# Patient Record
Sex: Female | Born: 1960 | Race: White | Hispanic: No | Marital: Married | Smoking: Never smoker
Health system: Southern US, Community
[De-identification: ages and names within clinical notes are randomized; demographics above are authoritative.]

## PROBLEM LIST (undated history)

## (undated) DIAGNOSIS — E119 Type 2 diabetes mellitus without complications: Secondary | ICD-10-CM

## (undated) DIAGNOSIS — Z8489 Family history of other specified conditions: Secondary | ICD-10-CM

## (undated) DIAGNOSIS — S060X9A Concussion with loss of consciousness of unspecified duration, initial encounter: Secondary | ICD-10-CM

## (undated) DIAGNOSIS — I10 Essential (primary) hypertension: Secondary | ICD-10-CM

## (undated) DIAGNOSIS — K219 Gastro-esophageal reflux disease without esophagitis: Secondary | ICD-10-CM

## (undated) DIAGNOSIS — F32A Depression, unspecified: Secondary | ICD-10-CM

## (undated) DIAGNOSIS — E7800 Pure hypercholesterolemia, unspecified: Secondary | ICD-10-CM

## (undated) DIAGNOSIS — F329 Major depressive disorder, single episode, unspecified: Secondary | ICD-10-CM

## (undated) DIAGNOSIS — B379 Candidiasis, unspecified: Secondary | ICD-10-CM

## (undated) HISTORY — DX: Pure hypercholesterolemia, unspecified: E78.00

## (undated) HISTORY — DX: Type 2 diabetes mellitus without complications: E11.9

## (undated) HISTORY — PX: WRIST SURGERY: SHX841

## (undated) HISTORY — DX: Depression, unspecified: F32.A

## (undated) HISTORY — PX: KNEE SURGERY: SHX244

## (undated) HISTORY — PX: GALLBLADDER SURGERY: SHX652

## (undated) HISTORY — DX: Essential (primary) hypertension: I10

## (undated) HISTORY — DX: Major depressive disorder, single episode, unspecified: F32.9

---

## 2004-07-25 ENCOUNTER — Ambulatory Visit: Payer: Self-pay | Admitting: Orthopaedic Surgery

## 2004-11-19 ENCOUNTER — Ambulatory Visit: Payer: Self-pay | Admitting: Family Medicine

## 2010-01-12 ENCOUNTER — Ambulatory Visit: Payer: Self-pay | Admitting: Family Medicine

## 2010-04-16 ENCOUNTER — Ambulatory Visit: Payer: Self-pay | Admitting: Family Medicine

## 2014-01-26 ENCOUNTER — Encounter: Payer: Self-pay | Admitting: Podiatry

## 2014-01-26 ENCOUNTER — Ambulatory Visit (INDEPENDENT_AMBULATORY_CARE_PROVIDER_SITE_OTHER): Payer: BC Managed Care – PPO

## 2014-01-26 ENCOUNTER — Ambulatory Visit (INDEPENDENT_AMBULATORY_CARE_PROVIDER_SITE_OTHER): Payer: BC Managed Care – PPO | Admitting: Podiatry

## 2014-01-26 ENCOUNTER — Other Ambulatory Visit: Payer: Self-pay | Admitting: *Deleted

## 2014-01-26 VITALS — BP 134/66 | HR 74 | Resp 16 | Ht 67.5 in | Wt 226.0 lb

## 2014-01-26 DIAGNOSIS — M722 Plantar fascial fibromatosis: Secondary | ICD-10-CM

## 2014-01-26 NOTE — Progress Notes (Signed)
   Subjective:    Patient ID: Betty Walters, female    DOB: 11-Dec-1960, 53 y.o.   MRN: [id number]  HPI Comments: Both plantar heels hurts for the last few months , it has progressively got worse. Hurts with any pressure on it .   Diabetic for 8 years , blood sugar on Monday was 136   Foot Pain Associated symptoms include numbness.      Review of Systems  Endocrine:       Diabetes   Musculoskeletal: Positive for back pain.  Neurological: Positive for numbness.  All other systems reviewed and are negative.      Objective:   Physical Exam: I have reviewed her past medical history medications allergies surgery social history and review of systems. Pulses are strongly palpable bilateral. Neurologic sensorium is intact percent lasting monofilament. Deep tendon reflexes are intact bilateral. Muscle strength is 5 over 5 dorsiflexion plantar flexors and inverters and everters all into the musculature is intact. Orthopedic evaluation and shoots all joints distal to the angle full range of motion without crepitation. She has severe pain on palpation medial calcaneal tubercle bilateral. Radiographic evaluation does dentistry soft tissue increasing density at the plantar fascial calcaneal insertion site.        Assessment & Plan:  Assessment: Plantar fasciitis bilateral.  Plan: We discussed the etiology pathology conservative versus surgical therapies. I injected the bilateral heels today with Kenalog and local anesthetic. Placed in a plantar fascial brace and a night splint. She will continue her Lodine no steroids were given. We discussed appropriate shoe gear stretching exercises ice therapy sugar modifications. I will follow up with her in 1 month.

## 2014-02-28 ENCOUNTER — Ambulatory Visit: Payer: BC Managed Care – PPO | Admitting: Podiatry

## 2014-04-06 LAB — LIPID PANEL
Cholesterol: 211 mg/dL — AB (ref 0–200)
HDL: 53 mg/dL (ref 35–70)
LDL CALC: 123 mg/dL
TRIGLYCERIDES: 177 mg/dL — AB (ref 40–160)

## 2014-04-06 LAB — BASIC METABOLIC PANEL
BUN: 15 mg/dL (ref 4–21)
Creatinine: 0.7 mg/dL (ref <=1.1)
GLUCOSE: 156 mg/dL

## 2014-04-06 LAB — HEPATIC FUNCTION PANEL
ALT: 23 U/L (ref 7–35)
AST: 11 U/L — AB (ref 13–35)

## 2014-04-11 LAB — HEMOGLOBIN A1C: Hgb A1c MFr Bld: 7.6 % — AB (ref 4.0–6.0)

## 2014-07-22 ENCOUNTER — Telehealth: Payer: Self-pay | Admitting: *Deleted

## 2014-07-22 NOTE — Telephone Encounter (Signed)
Pt states she is having a very bad flare-up of plantar fasciitis, next appt is 07/18/2014, what can she do until then?

## 2014-07-25 ENCOUNTER — Ambulatory Visit: Payer: BC Managed Care – PPO | Admitting: Podiatry

## 2014-07-31 DIAGNOSIS — S060X9A Concussion with loss of consciousness of unspecified duration, initial encounter: Secondary | ICD-10-CM

## 2014-07-31 DIAGNOSIS — S060XAA Concussion with loss of consciousness status unknown, initial encounter: Secondary | ICD-10-CM

## 2014-07-31 HISTORY — DX: Concussion with loss of consciousness status unknown, initial encounter: S06.0XAA

## 2014-07-31 HISTORY — DX: Concussion with loss of consciousness of unspecified duration, initial encounter: S06.0X9A

## 2014-08-10 ENCOUNTER — Emergency Department: Payer: Worker's Compensation

## 2014-08-10 ENCOUNTER — Emergency Department
Admission: EM | Admit: 2014-08-10 | Discharge: 2014-08-10 | Disposition: A | Discharge status: Home / Self Care | Payer: Worker's Compensation | Attending: Internal Medicine | Admitting: Internal Medicine

## 2014-08-10 ENCOUNTER — Encounter: Payer: Self-pay | Admitting: *Deleted

## 2014-08-10 DIAGNOSIS — Y9289 Other specified places as the place of occurrence of the external cause: Secondary | ICD-10-CM | POA: Diagnosis not present

## 2014-08-10 DIAGNOSIS — Y9389 Activity, other specified: Secondary | ICD-10-CM | POA: Insufficient documentation

## 2014-08-10 DIAGNOSIS — S00511A Abrasion of lip, initial encounter: Secondary | ICD-10-CM | POA: Insufficient documentation

## 2014-08-10 DIAGNOSIS — S0993XA Unspecified injury of face, initial encounter: Secondary | ICD-10-CM | POA: Diagnosis present

## 2014-08-10 DIAGNOSIS — Y998 Other external cause status: Secondary | ICD-10-CM | POA: Insufficient documentation

## 2014-08-10 DIAGNOSIS — Z79899 Other long term (current) drug therapy: Secondary | ICD-10-CM | POA: Insufficient documentation

## 2014-08-10 DIAGNOSIS — S8992XA Unspecified injury of left lower leg, initial encounter: Secondary | ICD-10-CM | POA: Insufficient documentation

## 2014-08-10 DIAGNOSIS — S0031XA Abrasion of nose, initial encounter: Secondary | ICD-10-CM | POA: Insufficient documentation

## 2014-08-10 DIAGNOSIS — S199XXA Unspecified injury of neck, initial encounter: Secondary | ICD-10-CM | POA: Diagnosis not present

## 2014-08-10 DIAGNOSIS — S4991XA Unspecified injury of right shoulder and upper arm, initial encounter: Secondary | ICD-10-CM | POA: Diagnosis not present

## 2014-08-10 DIAGNOSIS — I10 Essential (primary) hypertension: Secondary | ICD-10-CM | POA: Diagnosis not present

## 2014-08-10 DIAGNOSIS — S8991XA Unspecified injury of right lower leg, initial encounter: Secondary | ICD-10-CM | POA: Diagnosis not present

## 2014-08-10 DIAGNOSIS — S4992XA Unspecified injury of left shoulder and upper arm, initial encounter: Secondary | ICD-10-CM | POA: Diagnosis not present

## 2014-08-10 DIAGNOSIS — E119 Type 2 diabetes mellitus without complications: Secondary | ICD-10-CM | POA: Insufficient documentation

## 2014-08-10 DIAGNOSIS — Z791 Long term (current) use of non-steroidal anti-inflammatories (NSAID): Secondary | ICD-10-CM | POA: Diagnosis not present

## 2014-08-10 DIAGNOSIS — W01198A Fall on same level from slipping, tripping and stumbling with subsequent striking against other object, initial encounter: Secondary | ICD-10-CM | POA: Insufficient documentation

## 2014-08-10 DIAGNOSIS — W19XXXA Unspecified fall, initial encounter: Secondary | ICD-10-CM

## 2014-08-10 DIAGNOSIS — Z880 Allergy status to penicillin: Secondary | ICD-10-CM | POA: Insufficient documentation

## 2014-08-10 MED ORDER — HYDROCODONE-ACETAMINOPHEN 5-325 MG PO TABS: 1.0000 | ORAL_TABLET | ORAL | Status: DC | PRN

## 2014-08-10 NOTE — ED Notes (Signed)
   08/10/14 1805  Musculoskeletal  Musculoskeletal (WDL) X  Pt c/o pain in face after fall. Abrasion noted to nose and lip.

## 2014-08-10 NOTE — ED Provider Notes (Signed)
[facility] Emergency Department Provider Note  ____________________________________________  Time seen: Approximately 6:03 PM  I have reviewed the triage vital signs and the nursing notes.   HISTORY  Chief Complaint Fall   HPI Betty Walters is a 54 y.o. female patient reports she hasn't work and tripped on a rug. She fell forward hitting her face. Denies loss of consciousness. Patient states she also feels some soreness and neck arms and knees.Hx of torn left ACL. Pain is localized to he nose and maxillofacial area.   Past Medical History  Diagnosis Date  . Hypertension   . Diabetes mellitus without complication   . High cholesterol   . Depression     There are no active problems to display for this patient.   Past Surgical History  Procedure Laterality Date  . Gallbladder surgery    . Cesarean section      Current Outpatient Rx  Name  Route  Sig  Dispense  Refill  . etodolac (LODINE) 400 MG tablet               . glipiZIDE (GLUCOTROL) 10 MG tablet               . HYDROcodone-acetaminophen (NORCO) 5-325 MG per tablet   Oral   Take 1 tablet by mouth every 4 (four) hours as needed for moderate pain.   12 tablet   0   . lisinopril (PRINIVIL,ZESTRIL) 20 MG tablet               . lovastatin (MEVACOR) 20 MG tablet               . metFORMIN (GLUCOPHAGE) 1000 MG tablet               . sertraline (ZOLOFT) 100 MG tablet                 Allergies Amoxicillin-pot clavulanate  No family history on file.  Social History History  Substance Use Topics  . Smoking status: Never Smoker   . Smokeless tobacco: Never Used  . Alcohol Use: No    Review of Systems Constitutional: No fever/chills Eyes: No visual changes. ENT: No sore throat. Positive nose and left upper lip pain. Right nose bleeding resolved. Musculoskeletal: Negative for back pain. Positive left knee pain. Skin: Negative for rash. Neurological:  Negative for headaches, focal weakness or numbness.  10-point ROS otherwise negative.  ____________________________________________   PHYSICAL EXAM:  VITAL SIGNS: ED Triage Vitals  Enc Vitals Group     BP 08/10/14 1751 128/65 mmHg     Pulse Rate 08/10/14 1751 73     Resp 08/10/14 1751 18     Temp 08/10/14 1751 97.8 F (36.6 C)     Temp Source 08/10/14 1751 Oral     SpO2 08/10/14 1751 98 %     Weight 08/10/14 1751 214 lb (97.07 kg)     Height 08/10/14 1751 5\' 8"  (1.727 m)     Head Cir --      Peak Flow --      Pain Score 08/10/14 1752 6     Pain Loc --      Pain Edu? --      Excl. in [place]? --    Constitutional: Alert and oriented. Well appearing and in no acute distress. Eyes: Conjunctivae are normal. PERRL. EOMI. Head: Atraumatic. Nose: No congestion/rhinnorhea. Positive dried blood right nare. Mouth/Throat: Mucous membranes are moist.  Oropharynx non-erythematous. Positive tenderness to  upper mouth, teeth. No fx noted. Neck: No stridor.  No cervical spine tenderness to palpation. Musculoskeletal: No lower extremity tenderness nor edema.  No joint effusions. Redness noted to left knee with some palpable tenderness. No LROM. Decreased strength all exams. Neurologic:  Normal speech and language. No gross focal neurologic deficits are appreciated. Speech is normal. No gait instability. Skin:  Skin is warm, dry and intact. No rash noted. Psychiatric: Mood and affect are normal. Speech and behavior are normal.  ____________________________________________   LABS (all labs ordered are listed, but only abnormal results are displayed)  Labs Reviewed - No data to display ____________________________________________  EKG  Deferred ____________________________________________  RADIOLOGY  Interpreted by radiologist and reviewed by myself. All negative. ____________________________________________   PROCEDURES  Procedure(s) performed: None  Critical Care performed:  No  ____________________________________________   INITIAL IMPRESSION / ASSESSMENT AND PLAN / ED COURSE  Pertinent labs & imaging results that were available during my care of the patient were reviewed by me and considered in my medical decision making (see chart for details).  There is anything was discussed with the patient. Workmen's Comp. form filled out. Patient understands to return to the ER if new development or symptoms occur or worsen. ____________________________________________   FINAL CLINICAL IMPRESSION(S) / ED DIAGNOSES  Final diagnoses:  Fall, initial encounter      Arlyss Repress, PA-C 08/10/14 [place], DO 08/10/14 2223

## 2014-08-10 NOTE — ED Notes (Signed)
Pt reports she was at work and tripped on a rug. She fell forward, hitting her face. Denies LOC. Pt says that she also feels soreness in her neck, arms and knees.

## 2014-09-28 ENCOUNTER — Other Ambulatory Visit: Payer: Self-pay | Admitting: Family Medicine

## 2014-09-28 ENCOUNTER — Ambulatory Visit (INDEPENDENT_AMBULATORY_CARE_PROVIDER_SITE_OTHER): Payer: BC Managed Care – PPO | Admitting: Family Medicine

## 2014-09-28 ENCOUNTER — Encounter: Payer: Self-pay | Admitting: Family Medicine

## 2014-09-28 VITALS — BP 130/88 | HR 70 | Ht 68.0 in | Wt 235.0 lb

## 2014-09-28 DIAGNOSIS — E669 Obesity, unspecified: Secondary | ICD-10-CM | POA: Insufficient documentation

## 2014-09-28 DIAGNOSIS — E119 Type 2 diabetes mellitus without complications: Secondary | ICD-10-CM | POA: Diagnosis not present

## 2014-09-28 DIAGNOSIS — E66811 Obesity, class 1: Secondary | ICD-10-CM | POA: Insufficient documentation

## 2014-09-28 DIAGNOSIS — F339 Major depressive disorder, recurrent, unspecified: Secondary | ICD-10-CM

## 2014-09-28 DIAGNOSIS — F329 Major depressive disorder, single episode, unspecified: Secondary | ICD-10-CM | POA: Diagnosis not present

## 2014-09-28 DIAGNOSIS — E785 Hyperlipidemia, unspecified: Secondary | ICD-10-CM | POA: Diagnosis not present

## 2014-09-28 DIAGNOSIS — I10 Essential (primary) hypertension: Secondary | ICD-10-CM

## 2014-09-28 DIAGNOSIS — E1142 Type 2 diabetes mellitus with diabetic polyneuropathy: Secondary | ICD-10-CM

## 2014-09-28 DIAGNOSIS — F32A Depression, unspecified: Secondary | ICD-10-CM

## 2014-09-28 MED ORDER — SERTRALINE HCL 100 MG PO TABS: 100.0000 mg | ORAL_TABLET | Freq: Every day | ORAL | Status: DC

## 2014-09-28 MED ORDER — METFORMIN HCL 500 MG PO TABS: 500.0000 mg | ORAL_TABLET | Freq: Two times a day (BID) | ORAL | Status: DC

## 2014-09-28 MED ORDER — LISINOPRIL 20 MG PO TABS: 20.0000 mg | ORAL_TABLET | Freq: Every day | ORAL | Status: DC

## 2014-09-28 MED ORDER — LOVASTATIN 20 MG PO TABS: 20.0000 mg | ORAL_TABLET | Freq: Every morning | ORAL | Status: DC

## 2014-09-28 MED ORDER — GLIPIZIDE ER 10 MG PO TB24: 10.0000 mg | ORAL_TABLET | Freq: Two times a day (BID) | ORAL | Status: DC

## 2014-09-28 NOTE — Progress Notes (Signed)
Name: Betty Walters   MRN: [id number]    DOB: 1960/08/25   Date:09/28/2014       Progress Note  Subjective  Chief Complaint  Chief Complaint  Patient presents with  . Depression  . Diabetes  . Hypertension  . Hyperlipidemia    Diabetes She presents for her follow-up diabetic visit. She has type 2 diabetes mellitus. Her disease course has been fluctuating. Pertinent negatives for hypoglycemia include no confusion, dizziness, headaches, hunger, mood changes, nervousness/anxiousness, pallor, seizures, sleepiness, speech difficulty, sweats or tremors. Associated symptoms include blurred vision. Pertinent negatives for diabetes include no chest pain, no fatigue, no foot paresthesias, no foot ulcerations, no polydipsia, no polyphagia, no polyuria, no visual change, no weakness and no weight loss. (Visual concern since fall) Pertinent negatives for hypoglycemia complications include no blackouts, no hospitalization, no nocturnal hypoglycemia, no required assistance and no required glucagon injection. Symptoms are improving. There are no diabetic complications. Pertinent negatives for diabetic complications include no CVA, PVD or retinopathy. Risk factors for coronary artery disease include diabetes mellitus, dyslipidemia, obesity and hypertension. Current diabetic treatment includes diet and oral agent (dual therapy). Her weight is decreasing steadily. She is following a generally healthy diet. Meal planning includes carbohydrate counting. She has not had a previous visit with a dietitian. Her breakfast blood glucose is taken between 9-10 am. Her breakfast blood glucose range is generally 110-130 mg/dl. An ACE inhibitor/angiotensin II receptor blocker is being taken. She sees a podiatrist.Eye exam is current.  Hypertension This is a chronic problem. The current episode started more than 1 year ago. The problem has been gradually worsening since onset. The problem is controlled. Associated symptoms include  blurred vision. Pertinent negatives include no anxiety, chest pain, headaches, malaise/fatigue, neck pain, orthopnea, palpitations, peripheral edema, PND, shortness of breath or sweats. There are no associated agents to hypertension. Past treatments include ACE inhibitors. The current treatment provides moderate improvement. There are no compliance problems.  There is no history of angina, kidney disease, CAD/MI, CVA, heart failure, left ventricular hypertrophy, PVD or retinopathy. There is no history of chronic renal disease.  Hyperlipidemia This is a recurrent problem. The current episode started more than 1 year ago. The problem is controlled. Recent lipid tests were reviewed and are low. Exacerbating diseases include diabetes and obesity. She has no history of chronic renal disease, hypothyroidism, liver disease or nephrotic syndrome. Pertinent negatives include no chest pain, focal sensory loss, focal weakness, leg pain, myalgias or shortness of breath. The current treatment provides mild improvement of lipids. There are no compliance problems.  Risk factors for coronary artery disease include dyslipidemia, diabetes mellitus and hypertension.  Mental Health Problem The primary symptoms do not include dysphoric mood, delusions, hallucinations, bizarre behavior, disorganized speech, negative symptoms or somatic symptoms.  The onset of the illness is precipitated by emotional stress. The degree of incapacity that she is experiencing as a consequence of her illness is moderate. Additional symptoms of the illness do not include no anhedonia, no insomnia, no hypersomnia, no appetite change, no unexpected weight change, no fatigue, no agitation, no psychomotor retardation, no feelings of worthlessness, no attention impairment, no euphoric mood, no increased goal-directed activity, no flight of ideas, no inflated self-esteem, no decreased need for sleep, not distractible, no poor judgment, no visual change, no  headaches, no abdominal pain or no seizures. She does not admit to suicidal ideas. She does not have a plan to commit suicide. She does not contemplate harming herself. She has not already  injured self.    No problem-specific assessment & plan notes found for this encounter.   Past Medical History  Diagnosis Date  . Hypertension   . Diabetes mellitus without complication   . High cholesterol   . Depression     Past Surgical History  Procedure Laterality Date  . Gallbladder surgery    . Cesarean section    . Knee surgery    . Wrist surgery      Family History  Problem Relation Age of Onset  . Cancer Mother   . Diabetes Father     History   Social History  . Marital Status: Married    Spouse Name: N/A  . Number of Children: N/A  . Years of Education: N/A   Occupational History  . Not on file.   Social History Main Topics  . Smoking status: Never Smoker   . Smokeless tobacco: Never Used  . Alcohol Use: 0.0 oz/week    0 Standard drinks or equivalent per week  . Drug Use: No  . Sexual Activity: Yes   Other Topics Concern  . Not on file   Social History Narrative    Allergies  Allergen Reactions  . Amoxicillin-Pot Clavulanate Nausea And Vomiting     Review of Systems  Constitutional: Negative for fever, chills, weight loss, malaise/fatigue, appetite change, fatigue and unexpected weight change.  HENT: Negative for ear discharge, ear pain, hearing loss, sore throat and tinnitus.   Eyes: Positive for blurred vision.  Respiratory: Negative for cough, sputum production, shortness of breath and wheezing.   Cardiovascular: Negative for chest pain, palpitations, orthopnea, leg swelling and PND.  Gastrointestinal: Negative for heartburn, nausea, abdominal pain, diarrhea, constipation, blood in stool and melena.  Genitourinary: Negative for dysuria, urgency, frequency and hematuria.  Musculoskeletal: Negative for myalgias, back pain, joint pain and neck pain.   Skin: Negative for itching, pallor and rash.  Neurological: Negative for dizziness, tingling, tremors, sensory change, focal weakness, seizures, speech difficulty, weakness and headaches.  Endo/Heme/Allergies: Negative for environmental allergies, polydipsia and polyphagia. Does not bruise/bleed easily.  Psychiatric/Behavioral: Negative for depression, suicidal ideas, hallucinations, confusion, dysphoric mood and agitation. The patient is not nervous/anxious and does not have insomnia.      Objective  Filed Vitals:   09/28/14 1046  BP: 130/88  Pulse: 70  Height: 5\' 8"  (1.727 m)  Weight: 235 lb (106.595 kg)    Physical Exam  Constitutional: She is well-developed, well-nourished, and in no distress. No distress.  HENT:  Head: Normocephalic and atraumatic.  Right Ear: External ear normal.  Left Ear: External ear normal.  Nose: Nose normal.  Mouth/Throat: Oropharynx is clear and moist.  Eyes: Conjunctivae and EOM are normal. Pupils are equal, round, and reactive to light. Right eye exhibits no discharge. Left eye exhibits no discharge.  Neck: Normal range of motion. Neck supple. No JVD present. No thyromegaly present.  Cardiovascular: Normal rate, regular rhythm, normal heart sounds and intact distal pulses.  Exam reveals no gallop and no friction rub.   No murmur heard. Pulmonary/Chest: Effort normal and breath sounds normal. No respiratory distress. She has no wheezes. She has no rales. She exhibits no tenderness.  Abdominal: Soft. Bowel sounds are normal. She exhibits no mass. There is no tenderness. There is no guarding.  Musculoskeletal: Normal range of motion. She exhibits no edema.  Lymphadenopathy:    She has no cervical adenopathy.  Neurological: She is alert. She has normal reflexes.  Skin: Skin is warm and dry.  She is not diaphoretic.  Psychiatric: Mood and affect normal.      Assessment & Plan  Problem List Items Addressed This Visit      Cardiovascular and  Mediastinum   BP (high blood pressure) - Primary   Relevant Medications   lisinopril (PRINIVIL,ZESTRIL) 20 MG tablet   lovastatin (MEVACOR) 20 MG tablet   Other Relevant Orders   Renal Function Panel     Endocrine   Diabetes mellitus, type 2   Relevant Medications   glipiZIDE (GLUCOTROL XL) 10 MG 24 hr tablet   lisinopril (PRINIVIL,ZESTRIL) 20 MG tablet   lovastatin (MEVACOR) 20 MG tablet   metFORMIN (GLUCOPHAGE) 500 MG tablet   Other Relevant Orders   HgB A1c     Other   Clinical depression   Relevant Medications   sertraline (ZOLOFT) 100 MG tablet   HLD (hyperlipidemia)   Relevant Medications   lisinopril (PRINIVIL,ZESTRIL) 20 MG tablet   lovastatin (MEVACOR) 20 MG tablet   Other Relevant Orders   Lipid Profile        Dr. Otilio Miu [facility]  09/28/2014

## 2014-09-29 LAB — RENAL FUNCTION PANEL
ALBUMIN: 4.6 g/dL (ref 3.5–5.5)
BUN / CREAT RATIO: 22 (ref 9–23)
BUN: 14 mg/dL (ref 6–24)
CALCIUM: 10.1 mg/dL (ref 8.7–10.2)
CO2: 24 mmol/L (ref 18–29)
Chloride: 100 mmol/L (ref 97–108)
Creatinine, Ser: 0.65 mg/dL (ref 0.57–1.00)
GFR calc Af Amer: 119 mL/min/{1.73_m2} (ref >59)
GFR, EST NON AFRICAN AMERICAN: 103 mL/min/{1.73_m2} (ref >59)
Glucose: 135 mg/dL — ABNORMAL HIGH (ref 65–99)
PHOSPHORUS: 4.1 mg/dL (ref 2.5–4.5)
Potassium: 4.3 mmol/L (ref 3.5–5.2)
Sodium: 140 mmol/L (ref 134–144)

## 2014-09-29 LAB — LIPID PANEL
Chol/HDL Ratio: 3.7 ratio units (ref 0.0–4.4)
Cholesterol, Total: 192 mg/dL (ref 100–199)
HDL: 52 mg/dL (ref >39)
LDL CALC: 109 mg/dL — AB (ref 0–99)
TRIGLYCERIDES: 157 mg/dL — AB (ref 0–149)

## 2014-09-29 LAB — HEMOGLOBIN A1C
Est. average glucose Bld gHb Est-mCnc: 143 mg/dL
HEMOGLOBIN A1C: 6.6 % — AB (ref 4.8–5.6)

## 2014-10-21 DIAGNOSIS — S060X9A Concussion with loss of consciousness of unspecified duration, initial encounter: Secondary | ICD-10-CM | POA: Insufficient documentation

## 2014-10-21 HISTORY — DX: Concussion with loss of consciousness of unspecified duration, initial encounter: S06.0X9A

## 2014-11-04 ENCOUNTER — Encounter: Payer: Self-pay | Admitting: Family Medicine

## 2014-11-04 ENCOUNTER — Ambulatory Visit: Payer: BC Managed Care – PPO | Admitting: Family Medicine

## 2014-11-04 ENCOUNTER — Ambulatory Visit (INDEPENDENT_AMBULATORY_CARE_PROVIDER_SITE_OTHER): Payer: BC Managed Care – PPO | Admitting: Family Medicine

## 2014-11-04 VITALS — BP 104/60 | HR 60 | Ht 68.0 in | Wt 214.0 lb

## 2014-11-04 DIAGNOSIS — M793 Panniculitis, unspecified: Secondary | ICD-10-CM | POA: Diagnosis not present

## 2014-11-04 DIAGNOSIS — B379 Candidiasis, unspecified: Secondary | ICD-10-CM

## 2014-11-04 MED ORDER — CLOTRIMAZOLE-BETAMETHASONE 1-0.05 % EX CREA: 1.0000 "application " | TOPICAL_CREAM | Freq: Two times a day (BID) | CUTANEOUS | Status: DC

## 2014-11-04 MED ORDER — CEPHALEXIN 500 MG PO CAPS: 500.0000 mg | ORAL_CAPSULE | Freq: Three times a day (TID) | ORAL | Status: DC

## 2014-11-04 NOTE — Progress Notes (Signed)
Name: Betty Walters   MRN: [id number]    DOB: 1960-08-03   Date:11/04/2014       Progress Note  Subjective  Chief Complaint  Chief Complaint  Patient presents with  . Rash    Rash This is a new problem. The current episode started in the past 7 days. The problem has been gradually worsening since onset. The affected locations include the torso. The rash is characterized by burning, itchiness, redness and swelling. She was exposed to nothing. Pertinent negatives include no anorexia, congestion, cough, diarrhea, eye pain, facial edema, fatigue, fever, joint pain, nail changes, rhinorrhea, shortness of breath, sore throat or vomiting. Past treatments include antibiotics. The treatment provided mild relief.    No problem-specific assessment & plan notes found for this encounter.   Past Medical History  Diagnosis Date  . Hypertension   . Diabetes mellitus without complication   . High cholesterol   . Depression     Past Surgical History  Procedure Laterality Date  . Gallbladder surgery    . Cesarean section    . Knee surgery    . Wrist surgery      Family History  Problem Relation Age of Onset  . Cancer Mother   . Diabetes Father     History   Social History  . Marital Status: Married    Spouse Name: N/A  . Number of Children: N/A  . Years of Education: N/A   Occupational History  . Not on file.   Social History Main Topics  . Smoking status: Never Smoker   . Smokeless tobacco: Never Used  . Alcohol Use: 0.0 oz/week    0 Standard drinks or equivalent per week  . Drug Use: No  . Sexual Activity: Yes   Other Topics Concern  . Not on file   Social History Narrative    Allergies  Allergen Reactions  . Amoxicillin-Pot Clavulanate Nausea And Vomiting     Review of Systems  Constitutional: Negative for fever and fatigue.  HENT: Negative for congestion, rhinorrhea and sore throat.   Eyes: Negative for pain.  Respiratory: Negative for cough and shortness  of breath.   Gastrointestinal: Negative for vomiting, diarrhea and anorexia.  Musculoskeletal: Negative for joint pain.  Skin: Positive for rash. Negative for nail changes.     Objective  Filed Vitals:   11/04/14 1138  BP: 104/60  Pulse: 60  Height: 5\' 8"  (1.727 m)  Weight: 214 lb (97.07 kg)    Physical Exam    Assessment & Plan  Problem List Items Addressed This Visit    None    Visit Diagnoses    Monilial panniculitis    -  Primary    Relevant Medications    clotrimazole-betamethasone (LOTRISONE) cream    cephALEXin (KEFLEX) 500 MG capsule         Dr. Macon [facility]  11/04/2014

## 2014-11-07 ENCOUNTER — Ambulatory Visit
Admission: EM | Admit: 2014-11-07 | Discharge: 2014-11-07 | Disposition: A | Discharge status: Home / Self Care | Payer: BC Managed Care – PPO | Attending: Family Medicine | Admitting: Family Medicine

## 2014-11-07 ENCOUNTER — Encounter: Payer: Self-pay | Admitting: Emergency Medicine

## 2014-11-07 DIAGNOSIS — E780 Pure hypercholesterolemia: Secondary | ICD-10-CM | POA: Diagnosis not present

## 2014-11-07 DIAGNOSIS — X58XXXA Exposure to other specified factors, initial encounter: Secondary | ICD-10-CM | POA: Diagnosis not present

## 2014-11-07 DIAGNOSIS — F329 Major depressive disorder, single episode, unspecified: Secondary | ICD-10-CM | POA: Diagnosis not present

## 2014-11-07 DIAGNOSIS — T814XXA Infection following a procedure, initial encounter: Secondary | ICD-10-CM | POA: Insufficient documentation

## 2014-11-07 DIAGNOSIS — L03311 Cellulitis of abdominal wall: Secondary | ICD-10-CM | POA: Insufficient documentation

## 2014-11-07 DIAGNOSIS — I10 Essential (primary) hypertension: Secondary | ICD-10-CM | POA: Insufficient documentation

## 2014-11-07 DIAGNOSIS — Z79899 Other long term (current) drug therapy: Secondary | ICD-10-CM | POA: Diagnosis not present

## 2014-11-07 DIAGNOSIS — E119 Type 2 diabetes mellitus without complications: Secondary | ICD-10-CM | POA: Diagnosis not present

## 2014-11-07 HISTORY — DX: Candidiasis, unspecified: B37.9

## 2014-11-07 HISTORY — DX: Concussion with loss of consciousness of unspecified duration, initial encounter: S06.0X9A

## 2014-11-07 LAB — URINALYSIS COMPLETE WITH MICROSCOPIC (ARMC ONLY)
Bilirubin Urine: NEGATIVE
Glucose, UA: NEGATIVE mg/dL
Hgb urine dipstick: NEGATIVE
Ketones, ur: NEGATIVE mg/dL
Leukocytes, UA: NEGATIVE
Nitrite: NEGATIVE
PROTEIN: NEGATIVE mg/dL
SPECIFIC GRAVITY, URINE: 1.025 (ref 1.005–1.030)
pH: 5.5 (ref 5.0–8.0)

## 2014-11-07 MED ORDER — SULFAMETHOXAZOLE-TRIMETHOPRIM 800-160 MG PO TABS: 1.0000 | ORAL_TABLET | Freq: Two times a day (BID) | ORAL | Status: DC

## 2014-11-07 MED ORDER — CEFAZOLIN SODIUM 1 G IJ SOLR
1.0000 g | Freq: Once | INTRAMUSCULAR | Status: AC
  Administered 2014-11-07: 1 g via INTRAMUSCULAR

## 2014-11-07 NOTE — ED Provider Notes (Signed)
CSN: [id number]     Arrival date & time 11/07/14  1129 History   First MD Initiated Contact with Patient 11/07/14 1219     Chief Complaint  Patient presents with  . Wound Infection   (Consider location/radiation/quality/duration/timing/severity/associated sxs/prior Treatment) HPI Comments: 54 yo female, diabetic presents with a concern of a skin infection on her abdomen, along her old (47 yrs) C-section scar. States saw her PCP on Friday (3 days ago) for redness to the area and was started on Keflex tid and Lotrisone cream. Patient states since then area has been draining pus and blood. Denies any fevers, chills, vomiting, diarrhea.   The history is provided by the patient.    Past Medical History  Diagnosis Date  . Hypertension   . Diabetes mellitus without complication   . High cholesterol   . Depression   . Concussion May 2016  . Yeast infection    Past Surgical History  Procedure Laterality Date  . Gallbladder surgery    . Cesarean section    . Knee surgery    . Wrist surgery     Family History  Problem Relation Age of Onset  . Cancer Mother   . Diabetes Father    History  Substance Use Topics  . Smoking status: Never Smoker   . Smokeless tobacco: Never Used  . Alcohol Use: 0.0 oz/week    0 Standard drinks or equivalent per week   OB History    No data available     Review of Systems  Allergies  Amoxicillin-pot clavulanate  Home Medications   Prior to Admission medications   Medication Sig Start Date End Date Taking? Authorizing Provider  cephALEXin (KEFLEX) 500 MG capsule Take 1 capsule (500 mg total) by mouth 3 (three) times daily. 11/04/14   Juline Patch, MD  clotrimazole-betamethasone (LOTRISONE) cream Apply 1 application topically 2 (two) times daily. 11/04/14   Juline Patch, MD  etodolac (LODINE) 400 MG tablet  01/21/14   Historical Provider, MD  glipiZIDE (GLUCOTROL) 10 MG tablet TAKE ONE TABLET BY MOUTH TWICE DAILY 09/29/14   Juline Patch, MD   lisinopril (PRINIVIL,ZESTRIL) 20 MG tablet Take 1 tablet (20 mg total) by mouth daily. 09/28/14   Juline Patch, MD  lovastatin (MEVACOR) 20 MG tablet Take 1 tablet (20 mg total) by mouth every morning. 09/28/14   Juline Patch, MD  metFORMIN (GLUCOPHAGE) 1000 MG tablet TAKE ONE TABLET BY MOUTH TWICE DAILY 09/29/14   Juline Patch, MD  sertraline (ZOLOFT) 100 MG tablet Take 1 tablet (100 mg total) by mouth daily. 09/28/14   Juline Patch, MD  sulfamethoxazole-trimethoprim (BACTRIM DS,SEPTRA DS) 800-160 MG per tablet Take 1 tablet by mouth 2 (two) times daily. 11/07/14   Norval Gable, MD   BP 126/62 mmHg  Pulse 80  Temp(Src) 99.4 F (37.4 C) (Tympanic)  Resp 18  Ht 5\' 8"  (1.727 m)  Wt 212 lb (96.163 kg)  BMI 32.24 kg/m2  SpO2 100% Physical Exam  Constitutional: She appears well-developed and well-nourished. No distress.  Skin: She is not diaphoretic.  4x5cm erythematous skin area on left lower abdomen along previous (old) C-section scar; spontaneously draining purulent drainage  Nursing note and vitals reviewed.   ED Course  Procedures (including critical care time) Labs Review Labs Reviewed  URINALYSIS COMPLETEWITH MICROSCOPIC [facility] ONLY) - Abnormal; Notable for the following:    Squamous Epithelial / LPF 0-5 (*)    All other components within normal limits  URINE  CULTURE  CULTURE, ROUTINE-ABSCESS    Imaging Review No results found.   MDM   1. Cellulitis, abdominal wall    New Prescriptions   SULFAMETHOXAZOLE-TRIMETHOPRIM (BACTRIM DS,SEPTRA DS) 800-160 MG PER TABLET    Take 1 tablet by mouth 2 (two) times daily.   Plan: 1.  diagnosis reviewed with patient; sample obtained for culture 2. rx as per orders; risks, benefits, potential side effects reviewed with patient; continue current medications 3. Recommend supportive treatment with warm compresses to area 4. Patient given Ancef 1gm IM x 1 4. F/u prn if symptoms worsen or don't improve    Norval Gable,  MD 11/07/14 1328

## 2014-11-07 NOTE — ED Notes (Signed)
Pt also reports pressure on bladder, urinary doesn't feel normal.

## 2014-11-07 NOTE — ED Notes (Signed)
Pt had c-section 25 years ago, on old scar has redness, swelling and bleeding. Seen at her PCP on Friday and given cream for yeast infection and oral antibiotic.

## 2014-11-09 ENCOUNTER — Telehealth: Payer: Self-pay

## 2014-11-09 LAB — URINE CULTURE: SPECIAL REQUESTS: NORMAL

## 2014-11-10 LAB — CULTURE, ROUTINE-ABSCESS: Special Requests: NORMAL

## 2014-12-02 ENCOUNTER — Other Ambulatory Visit: Payer: Self-pay | Admitting: Internal Medicine

## 2014-12-02 MED ORDER — AZITHROMYCIN 250 MG PO TABS: ORAL_TABLET | ORAL | Status: DC

## 2014-12-09 ENCOUNTER — Other Ambulatory Visit: Payer: Self-pay

## 2014-12-28 ENCOUNTER — Ambulatory Visit (INDEPENDENT_AMBULATORY_CARE_PROVIDER_SITE_OTHER): Payer: BC Managed Care – PPO | Admitting: Family Medicine

## 2014-12-28 ENCOUNTER — Encounter: Payer: Self-pay | Admitting: Family Medicine

## 2014-12-28 VITALS — BP 120/70 | HR 78 | Ht 68.0 in | Wt 211.0 lb

## 2014-12-28 DIAGNOSIS — F32A Depression, unspecified: Secondary | ICD-10-CM

## 2014-12-28 DIAGNOSIS — F329 Major depressive disorder, single episode, unspecified: Secondary | ICD-10-CM | POA: Diagnosis not present

## 2014-12-28 MED ORDER — SERTRALINE HCL 50 MG PO TABS: 50.0000 mg | ORAL_TABLET | Freq: Every day | ORAL | Status: DC

## 2014-12-28 MED ORDER — SERTRALINE HCL 100 MG PO TABS
100.0000 mg | ORAL_TABLET | Freq: Every day | ORAL | Status: DC
Start: 2014-12-28 — End: 2015-06-21

## 2014-12-28 NOTE — Progress Notes (Signed)
Name: Betty Walters   MRN: [id number]    DOB: October 19, 1960   Date:12/28/2014       Progress Note  Subjective  Chief Complaint  Chief Complaint  Patient presents with  . Depression    increased to 150mg  qday on Sertraline- "been better"  . Ear Fullness    pressure in R) ear    Depression      The patient presents with depression.  This is a chronic problem.  The current episode started more than 1 year ago.   The onset quality is gradual.   The problem occurs intermittently.  The problem has been gradually improving since onset.  Associated symptoms include fatigue, helplessness and sad.  Associated symptoms include no decreased concentration, no hopelessness, does not have insomnia, not irritable, no restlessness, no decreased interest, no appetite change, no body aches, no myalgias, no headaches, no indigestion and no suicidal ideas.( despondency)     Exacerbated by: post concussion circumstances.  Past treatments include SSRIs - Selective serotonin reuptake inhibitors.  Compliance with treatment is good.  Risk factors include prior traumatic experience.   Past medical history includes depression and head trauma.    (controlled in past on medication) Ear Fullness  There is pain in the right ear. This is a recurrent problem. The current episode started 1 to 4 weeks ago. The problem occurs constantly. The problem has been waxing and waning. There has been no fever. The pain is mild (pressure). Pertinent negatives include no abdominal pain, coughing, diarrhea, ear discharge, headaches, neck pain, rash or sore throat. controlled in past on medication    No problem-specific assessment & plan notes found for this encounter.   Past Medical History  Diagnosis Date  . Hypertension   . Diabetes mellitus without complication   . High cholesterol   . Depression   . Concussion May 2016  . Yeast infection     Past Surgical History  Procedure Laterality Date  . Gallbladder surgery    . Cesarean  section    . Knee surgery    . Wrist surgery      Family History  Problem Relation Age of Onset  . Cancer Mother   . Diabetes Father     Social History   Social History  . Marital Status: Married    Spouse Name: N/A  . Number of Children: N/A  . Years of Education: N/A   Occupational History  . Not on file.   Social History Main Topics  . Smoking status: Never Smoker   . Smokeless tobacco: Never Used  . Alcohol Use: 0.0 oz/week    0 Standard drinks or equivalent per week  . Drug Use: No  . Sexual Activity: Yes   Other Topics Concern  . Not on file   Social History Narrative    Allergies  Allergen Reactions  . Amoxicillin-Pot Clavulanate Nausea And Vomiting     Review of Systems  Constitutional: Positive for fatigue. Negative for fever, chills, weight loss, malaise/fatigue and appetite change.  HENT: Positive for ear pain. Negative for ear discharge and sore throat.   Eyes: Negative for blurred vision.  Respiratory: Negative for cough, sputum production, shortness of breath and wheezing.   Cardiovascular: Negative for chest pain, palpitations and leg swelling.  Gastrointestinal: Negative for heartburn, nausea, abdominal pain, diarrhea, constipation, blood in stool and melena.  Genitourinary: Negative for dysuria, urgency, frequency and hematuria.  Musculoskeletal: Negative for myalgias, back pain, joint pain and neck pain.  Skin:  Negative for rash.  Neurological: Negative for dizziness, tingling, sensory change, focal weakness and headaches.  Endo/Heme/Allergies: Negative for environmental allergies and polydipsia. Does not bruise/bleed easily.  Psychiatric/Behavioral: Positive for depression. Negative for suicidal ideas and decreased concentration. The patient is nervous/anxious. The patient does not have insomnia.      Objective  Filed Vitals:   12/28/14 1035  BP: 120/70  Pulse: 78  Height: 5\' 8"  (1.727 m)  Weight: 211 lb (95.709 kg)    Physical  Exam  Constitutional: She is well-developed, well-nourished, and in no distress. She is not irritable. No distress.  HENT:  Head: Normocephalic and atraumatic.  Right Ear: External ear normal.  Left Ear: External ear normal.  Nose: Nose normal.  Mouth/Throat: Oropharynx is clear and moist.  Eyes: Conjunctivae and EOM are normal. Pupils are equal, round, and reactive to light. Right eye exhibits no discharge. Left eye exhibits no discharge.  Neck: Normal range of motion. Neck supple. No JVD present. No thyromegaly present.  Cardiovascular: Normal rate, regular rhythm, normal heart sounds and intact distal pulses.  Exam reveals no gallop and no friction rub.   No murmur heard. Pulmonary/Chest: Effort normal and breath sounds normal.  Abdominal: Soft. Bowel sounds are normal. She exhibits no mass. There is no tenderness. There is no guarding.  Musculoskeletal: Normal range of motion. She exhibits no edema.  Lymphadenopathy:    She has no cervical adenopathy.  Neurological: She is alert. She has normal reflexes.  Skin: Skin is warm and dry. She is not diaphoretic.  Psychiatric: Mood and affect normal.  Nursing note and vitals reviewed.     Assessment & Plan  Problem List Items Addressed This Visit      Other   Clinical depression - Primary   Relevant Medications   sertraline (ZOLOFT) 100 MG tablet   sertraline (ZOLOFT) 50 MG tablet        Dr. [facility]  12/28/2014

## 2015-02-03 ENCOUNTER — Other Ambulatory Visit: Payer: Self-pay | Admitting: General Surgery

## 2015-02-03 DIAGNOSIS — K632 Fistula of intestine: Secondary | ICD-10-CM

## 2015-02-08 ENCOUNTER — Other Ambulatory Visit: Payer: Self-pay

## 2015-02-09 ENCOUNTER — Ambulatory Visit
Admission: RE | Admit: 2015-02-09 | Discharge: 2015-02-09 | Disposition: A | Discharge status: Home / Self Care | Payer: BC Managed Care – PPO | Source: Ambulatory Visit | Attending: General Surgery | Admitting: General Surgery

## 2015-02-09 DIAGNOSIS — K632 Fistula of intestine: Secondary | ICD-10-CM

## 2015-02-09 MED ORDER — IOPAMIDOL (ISOVUE-300) INJECTION 61%
125.0000 mL | Freq: Once | INTRAVENOUS | Status: AC | PRN
  Administered 2015-02-09: 125 mL via INTRAVENOUS

## 2015-04-28 ENCOUNTER — Other Ambulatory Visit: Payer: Self-pay | Admitting: Family Medicine

## 2015-05-28 ENCOUNTER — Other Ambulatory Visit: Payer: Self-pay | Admitting: Family Medicine

## 2015-06-21 ENCOUNTER — Ambulatory Visit
Admission: RE | Admit: 2015-06-21 | Discharge: 2015-06-21 | Disposition: A | Discharge status: Home / Self Care | Payer: BC Managed Care – PPO | Source: Ambulatory Visit | Attending: Orthopedic Surgery | Admitting: Orthopedic Surgery

## 2015-06-21 ENCOUNTER — Encounter: Payer: Self-pay | Admitting: Family Medicine

## 2015-06-21 ENCOUNTER — Ambulatory Visit (INDEPENDENT_AMBULATORY_CARE_PROVIDER_SITE_OTHER): Payer: BC Managed Care – PPO | Admitting: Family Medicine

## 2015-06-21 ENCOUNTER — Other Ambulatory Visit: Payer: Self-pay | Admitting: Orthopedic Surgery

## 2015-06-21 VITALS — BP 120/80 | HR 80 | Ht 68.0 in | Wt 225.0 lb

## 2015-06-21 DIAGNOSIS — M25552 Pain in left hip: Secondary | ICD-10-CM | POA: Insufficient documentation

## 2015-06-21 DIAGNOSIS — E785 Hyperlipidemia, unspecified: Secondary | ICD-10-CM

## 2015-06-21 DIAGNOSIS — E119 Type 2 diabetes mellitus without complications: Secondary | ICD-10-CM

## 2015-06-21 DIAGNOSIS — F32A Depression, unspecified: Secondary | ICD-10-CM

## 2015-06-21 DIAGNOSIS — M5137 Other intervertebral disc degeneration, lumbosacral region: Secondary | ICD-10-CM | POA: Diagnosis not present

## 2015-06-21 DIAGNOSIS — J301 Allergic rhinitis due to pollen: Secondary | ICD-10-CM | POA: Insufficient documentation

## 2015-06-21 DIAGNOSIS — F329 Major depressive disorder, single episode, unspecified: Secondary | ICD-10-CM | POA: Diagnosis not present

## 2015-06-21 DIAGNOSIS — M545 Low back pain: Secondary | ICD-10-CM

## 2015-06-21 DIAGNOSIS — I10 Essential (primary) hypertension: Secondary | ICD-10-CM | POA: Diagnosis not present

## 2015-06-21 MED ORDER — METFORMIN HCL 1000 MG PO TABS: 1000.0000 mg | ORAL_TABLET | Freq: Two times a day (BID) | ORAL | Status: DC

## 2015-06-21 MED ORDER — SERTRALINE HCL 50 MG PO TABS
50.0000 mg | ORAL_TABLET | Freq: Every day | ORAL | Status: DC
Start: 2015-06-21 — End: 2015-12-27

## 2015-06-21 MED ORDER — SERTRALINE HCL 100 MG PO TABS: 100.0000 mg | ORAL_TABLET | Freq: Every day | ORAL | Status: DC

## 2015-06-21 MED ORDER — LOVASTATIN 20 MG PO TABS: ORAL_TABLET | ORAL | Status: DC

## 2015-06-21 MED ORDER — LISINOPRIL 20 MG PO TABS: ORAL_TABLET | ORAL | Status: DC

## 2015-06-21 MED ORDER — GLIPIZIDE 10 MG PO TABS: 10.0000 mg | ORAL_TABLET | Freq: Two times a day (BID) | ORAL | Status: DC

## 2015-06-21 NOTE — Progress Notes (Signed)
Name: Betty Walters   MRN: [id number]    DOB: [birth year]   Date:06/21/2015       Progress Note  Subjective  Chief Complaint  Chief Complaint  Patient presents with  . Hypertension  . Hyperlipidemia  . Diabetes  . Depression    Hypertension This is a chronic problem. The current episode started more than 1 year ago. The problem has been gradually improving since onset. The problem is controlled. Pertinent negatives include no anxiety, blurred vision, chest pain, headaches, malaise/fatigue, neck pain, orthopnea, palpitations, peripheral edema, PND, shortness of breath or sweats. There are no known risk factors for coronary artery disease. Past treatments include ACE inhibitors. The current treatment provides mild improvement. There are no compliance problems.  There is no history of angina, kidney disease, CAD/MI, CVA, heart failure, left ventricular hypertrophy, PVD, renovascular disease or retinopathy. There is no history of chronic renal disease or a hypertension causing med.  Hyperlipidemia This is a chronic problem. The current episode started more than 1 year ago. The problem is controlled. Recent lipid tests were reviewed and are normal. She has no history of chronic renal disease. Pertinent negatives include no chest pain, focal weakness, myalgias or shortness of breath. The current treatment provides mild improvement of lipids. There are no compliance problems.   Diabetes She presents for her follow-up diabetic visit. She has type 2 diabetes mellitus. Her disease course has been fluctuating (prednisone for knee). Pertinent negatives for hypoglycemia include no dizziness, headaches, nervousness/anxiousness or sweats. Pertinent negatives for diabetes include no blurred vision, no chest pain, no fatigue, no foot paresthesias, no polydipsia and no weight loss. Symptoms are worsening. There are no diabetic complications. Pertinent negatives for diabetic complications include no CVA, PVD or  retinopathy. Current diabetic treatment includes oral agent (dual therapy). Her weight is stable. She is following a generally healthy diet. Her breakfast blood glucose is taken between 8-9 am. Her breakfast blood glucose range is generally 140-180 mg/dl. She does not see a podiatrist.Eye exam is not current.  Depression        This is a chronic problem.  The onset quality is gradual.   Associated symptoms include no decreased concentration, no fatigue, no helplessness, no hopelessness, does not have insomnia, not irritable, no restlessness, no decreased interest, no appetite change, no body aches, no myalgias, no headaches, no indigestion, not sad and no suicidal ideas.     The symptoms are aggravated by nothing.  Compliance with treatment is good.   Pertinent negatives include no anxiety.   No problem-specific assessment & plan notes found for this encounter.   Past Medical History  Diagnosis Date  . Hypertension   . Diabetes mellitus without complication ([place])   . High cholesterol   . Depression   . Concussion May 2016  . Yeast infection     Past Surgical History  Procedure Laterality Date  . Gallbladder surgery    . Cesarean section    . Knee surgery    . Wrist surgery      Family History  Problem Relation Age of Onset  . Cancer Mother   . Diabetes Father     Social History   Social History  . Marital Status: Married    Spouse Name: N/A  . Number of Children: N/A  . Years of Education: N/A   Occupational History  . Not on file.   Social History Main Topics  . Smoking status: Never Smoker   . Smokeless tobacco: Never Used  .  Alcohol Use: 0.0 oz/week    0 Standard drinks or equivalent per week  . Drug Use: No  . Sexual Activity: Yes   Other Topics Concern  . Not on file   Social History Narrative    Allergies  Allergen Reactions  . Amoxicillin-Pot Clavulanate Nausea And Vomiting     Review of Systems  Constitutional: Negative for fever, chills, weight  loss, malaise/fatigue, appetite change and fatigue.  HENT: Negative for ear discharge, ear pain and sore throat.   Eyes: Negative for blurred vision.  Respiratory: Negative for cough, sputum production, shortness of breath and wheezing.   Cardiovascular: Negative for chest pain, palpitations, orthopnea, leg swelling and PND.  Gastrointestinal: Negative for heartburn, nausea, abdominal pain, diarrhea, constipation, blood in stool and melena.  Genitourinary: Negative for dysuria, urgency, frequency and hematuria.  Musculoskeletal: Negative for myalgias, back pain, joint pain and neck pain.  Skin: Negative for rash.  Neurological: Negative for dizziness, tingling, sensory change, focal weakness and headaches.  Endo/Heme/Allergies: Negative for environmental allergies and polydipsia. Does not bruise/bleed easily.  Psychiatric/Behavioral: Positive for depression. Negative for suicidal ideas and decreased concentration. The patient is not nervous/anxious and does not have insomnia.      Objective  Filed Vitals:   06/21/15 1015  BP: 120/80  Pulse: 80  Height: 5\' 8"  (1.727 m)  Weight: 225 lb (102.059 kg)    Physical Exam  Constitutional: She is well-developed, well-nourished, and in no distress. She is not irritable. No distress.  HENT:  Head: Normocephalic and atraumatic.  Right Ear: External ear normal.  Left Ear: External ear normal.  Nose: Nose normal.  Mouth/Throat: Oropharynx is clear and moist.  Eyes: Conjunctivae and EOM are normal. Pupils are equal, round, and reactive to light. Right eye exhibits no discharge. Left eye exhibits no discharge.  Neck: Normal range of motion. Neck supple. No JVD present. No thyromegaly present.  Cardiovascular: Normal rate, regular rhythm, normal heart sounds and intact distal pulses.  Exam reveals no gallop and no friction rub.   No murmur heard. Pulmonary/Chest: Effort normal and breath sounds normal.  Abdominal: Soft. Bowel sounds are normal.  She exhibits no mass. There is no tenderness. There is no guarding.  Musculoskeletal: Normal range of motion. She exhibits no edema.  Lymphadenopathy:    She has no cervical adenopathy.  Neurological: She is alert. She has normal reflexes.  Skin: Skin is warm and dry. She is not diaphoretic.  Psychiatric: Mood and affect normal.  Nursing note and vitals reviewed.     Assessment & Plan  Problem List Items Addressed This Visit      Cardiovascular and Mediastinum   BP (high blood pressure) - Primary   Relevant Medications   lisinopril (PRINIVIL,ZESTRIL) 20 MG tablet   lovastatin (MEVACOR) 20 MG tablet   Other Relevant Orders   Renal Function Panel     Respiratory   Allergic rhinitis due to pollen     Endocrine   Diabetes mellitus, type 2 (HCC)   Relevant Medications   glipiZIDE (GLUCOTROL) 10 MG tablet   metFORMIN (GLUCOPHAGE) 1000 MG tablet   lisinopril (PRINIVIL,ZESTRIL) 20 MG tablet   lovastatin (MEVACOR) 20 MG tablet   Other Relevant Orders   Hemoglobin A1c   Microalbumin / creatinine urine ratio     Other   Clinical depression   Relevant Medications   sertraline (ZOLOFT) 100 MG tablet   sertraline (ZOLOFT) 50 MG tablet   HLD (hyperlipidemia)   Relevant Medications   lisinopril (PRINIVIL,ZESTRIL)  20 MG tablet   lovastatin (MEVACOR) 20 MG tablet   Other Relevant Orders   Lipid Profile        Dr. Otilio [facility]  06/21/2015

## 2015-06-22 LAB — LIPID PANEL
CHOLESTEROL TOTAL: 257 mg/dL — AB (ref 100–199)
Chol/HDL Ratio: 4.6 ratio units — ABNORMAL HIGH (ref 0.0–4.4)
HDL: 56 mg/dL (ref >39)
LDL Calculated: 164 mg/dL — ABNORMAL HIGH (ref 0–99)
Triglycerides: 187 mg/dL — ABNORMAL HIGH (ref 0–149)
VLDL Cholesterol Cal: 37 mg/dL (ref 5–40)

## 2015-06-22 LAB — RENAL FUNCTION PANEL
Albumin: 4.4 g/dL (ref 3.5–5.5)
BUN / CREAT RATIO: 20 (ref 9–23)
BUN: 14 mg/dL (ref 6–24)
CALCIUM: 9.5 mg/dL (ref 8.7–10.2)
CHLORIDE: 102 mmol/L (ref 96–106)
CO2: 19 mmol/L (ref 18–29)
Creatinine, Ser: 0.69 mg/dL (ref 0.57–1.00)
GFR calc Af Amer: 116 mL/min/{1.73_m2} (ref >59)
GFR calc non Af Amer: 100 mL/min/{1.73_m2} (ref >59)
Glucose: 125 mg/dL — ABNORMAL HIGH (ref 65–99)
POTASSIUM: 4.5 mmol/L (ref 3.5–5.2)
Phosphorus: 3.4 mg/dL (ref 2.5–4.5)
SODIUM: 140 mmol/L (ref 134–144)

## 2015-06-22 LAB — MICROALBUMIN / CREATININE URINE RATIO
Creatinine, Urine: 169.2 mg/dL
MICROALB/CREAT RATIO: 7.4 mg/g{creat} (ref 0.0–30.0)
MICROALBUM., U, RANDOM: 12.5 ug/mL

## 2015-06-22 LAB — HEMOGLOBIN A1C
ESTIMATED AVERAGE GLUCOSE: 148 mg/dL
HEMOGLOBIN A1C: 6.8 % — AB (ref 4.8–5.6)

## 2015-07-17 ENCOUNTER — Ambulatory Visit: Payer: BC Managed Care – PPO | Admitting: Podiatry

## 2015-08-04 ENCOUNTER — Ambulatory Visit (INDEPENDENT_AMBULATORY_CARE_PROVIDER_SITE_OTHER): Payer: BC Managed Care – PPO | Admitting: Family Medicine

## 2015-08-04 ENCOUNTER — Encounter: Payer: Self-pay | Admitting: Family Medicine

## 2015-08-04 ENCOUNTER — Other Ambulatory Visit: Payer: Self-pay

## 2015-08-04 VITALS — BP 118/80 | HR 62 | Temp 97.8°F | Ht 68.0 in | Wt 228.0 lb

## 2015-08-04 DIAGNOSIS — J029 Acute pharyngitis, unspecified: Secondary | ICD-10-CM

## 2015-08-04 DIAGNOSIS — N760 Acute vaginitis: Secondary | ICD-10-CM

## 2015-08-04 MED ORDER — FLUCONAZOLE 150 MG PO TABS: 150.0000 mg | ORAL_TABLET | Freq: Once | ORAL | Status: DC

## 2015-08-04 MED ORDER — AMOXICILLIN 500 MG PO CAPS: 500.0000 mg | ORAL_CAPSULE | Freq: Three times a day (TID) | ORAL | Status: DC

## 2015-08-04 NOTE — Progress Notes (Signed)
Name: Betty Walters   MRN: [id number]    DOB: 09-29-1960   Date:08/04/2015       Progress Note  Subjective  Chief Complaint  Chief Complaint  Patient presents with  . Sinusitis    headache, sore throat, cough- dry    Sinusitis The current episode started yesterday. The problem has been gradually worsening since onset. There has been no fever. The pain is moderate. Associated symptoms include chills, coughing, headaches, a hoarse voice, a sore throat and swollen glands. Pertinent negatives include no congestion, diaphoresis, ear pain, neck pain, shortness of breath, sinus pressure or sneezing. Past treatments include acetaminophen. The treatment provided no relief.  Sore Throat  This is a new problem. The current episode started yesterday. Neither side of throat is experiencing more pain than the other. Associated symptoms include coughing, headaches, a hoarse voice and swollen glands. Pertinent negatives include no abdominal pain, congestion, diarrhea, ear discharge, ear pain, neck pain or shortness of breath. She has had exposure to strep. She has tried acetaminophen for the symptoms. The treatment provided mild relief.    No problem-specific assessment & plan notes found for this encounter.   Past Medical History  Diagnosis Date  . Hypertension   . Diabetes mellitus without complication ([place])   . High cholesterol   . Depression   . Concussion May 2016  . Yeast infection     Past Surgical History  Procedure Laterality Date  . Gallbladder surgery    . Cesarean section    . Knee surgery    . Wrist surgery      Family History  Problem Relation Age of Onset  . Cancer Mother   . Diabetes Father     Social History   Social History  . Marital Status: Married    Spouse Name: N/A  . Number of Children: N/A  . Years of Education: N/A   Occupational History  . Not on file.   Social History Main Topics  . Smoking status: Never Smoker   . Smokeless tobacco: Never Used   . Alcohol Use: 0.0 oz/week    0 Standard drinks or equivalent per week  . Drug Use: No  . Sexual Activity: Yes   Other Topics Concern  . Not on file   Social History Narrative    Allergies  Allergen Reactions  . Amoxicillin-Pot Clavulanate Nausea And Vomiting     Review of Systems  Constitutional: Positive for chills. Negative for fever, weight loss, malaise/fatigue and diaphoresis.  HENT: Positive for hoarse voice and sore throat. Negative for congestion, ear discharge, ear pain, sinus pressure and sneezing.   Eyes: Negative for blurred vision.  Respiratory: Positive for cough. Negative for sputum production, shortness of breath and wheezing.   Cardiovascular: Negative for chest pain, palpitations and leg swelling.  Gastrointestinal: Negative for heartburn, nausea, abdominal pain, diarrhea, constipation, blood in stool and melena.  Genitourinary: Negative for dysuria, urgency, frequency and hematuria.  Musculoskeletal: Negative for myalgias, back pain, joint pain and neck pain.  Skin: Negative for rash.  Neurological: Positive for headaches. Negative for dizziness, tingling, sensory change and focal weakness.  Endo/Heme/Allergies: Negative for environmental allergies and polydipsia. Does not bruise/bleed easily.  Psychiatric/Behavioral: Negative for depression and suicidal ideas. The patient is not nervous/anxious and does not have insomnia.      Objective  Filed Vitals:   08/04/15 1401  BP: 118/80  Pulse: 62  Temp: 97.8 F (36.6 C)  TempSrc: Oral  Height: 5\' 8"  (1.727 m)  Weight: 228 lb (103.42 kg)    Physical Exam  Constitutional: She is well-developed, well-nourished, and in no distress. No distress.  HENT:  Head: Normocephalic and atraumatic.  Right Ear: Tympanic membrane and external ear normal.  Left Ear: Tympanic membrane and external ear normal.  Nose: Nose normal. No mucosal edema. Right sinus exhibits no maxillary sinus tenderness and no frontal sinus  tenderness. Left sinus exhibits no maxillary sinus tenderness and no frontal sinus tenderness.  Mouth/Throat: Posterior oropharyngeal erythema present. No oropharyngeal exudate.  Eyes: Conjunctivae and EOM are normal. Pupils are equal, round, and reactive to light. Right eye exhibits no discharge. Left eye exhibits no discharge.  Neck: Normal range of motion. Neck supple. No JVD present. No thyromegaly present.  Cardiovascular: Normal rate, regular rhythm, normal heart sounds and intact distal pulses.  Exam reveals no gallop and no friction rub.   No murmur heard. Pulmonary/Chest: Effort normal and breath sounds normal.  Abdominal: Soft. Bowel sounds are normal. She exhibits no mass. There is no tenderness. There is no guarding.  Musculoskeletal: Normal range of motion. She exhibits no edema.  Lymphadenopathy:    She has no cervical adenopathy.  Neurological: She is alert. She has normal reflexes.  Skin: Skin is warm and dry. She is not diaphoretic.  Psychiatric: Mood and affect normal.  Nursing note and vitals reviewed.     Assessment & Plan  Problem List Items Addressed This Visit    None    Visit Diagnoses    Pharyngitis    -  Primary    Relevant Medications    amoxicillin (AMOXIL) 500 MG capsule    Vaginitis        Relevant Medications    fluconazole (DIFLUCAN) 150 MG tablet         Dr. [facility]  08/04/2015

## 2015-08-30 ENCOUNTER — Other Ambulatory Visit: Payer: Self-pay | Admitting: Orthopedic Surgery

## 2015-08-30 DIAGNOSIS — M7989 Other specified soft tissue disorders: Secondary | ICD-10-CM

## 2015-09-01 ENCOUNTER — Ambulatory Visit
Admission: RE | Admit: 2015-09-01 | Discharge: 2015-09-01 | Disposition: A | Discharge status: Home / Self Care | Payer: Worker's Compensation | Source: Ambulatory Visit | Attending: Orthopedic Surgery | Admitting: Orthopedic Surgery

## 2015-09-01 DIAGNOSIS — M79605 Pain in left leg: Secondary | ICD-10-CM | POA: Insufficient documentation

## 2015-09-01 DIAGNOSIS — M7989 Other specified soft tissue disorders: Secondary | ICD-10-CM | POA: Insufficient documentation

## 2015-09-06 ENCOUNTER — Encounter: Payer: Self-pay | Admitting: Podiatry

## 2015-09-06 ENCOUNTER — Ambulatory Visit (INDEPENDENT_AMBULATORY_CARE_PROVIDER_SITE_OTHER): Payer: BC Managed Care – PPO

## 2015-09-06 ENCOUNTER — Ambulatory Visit (INDEPENDENT_AMBULATORY_CARE_PROVIDER_SITE_OTHER): Payer: Self-pay | Admitting: Podiatry

## 2015-09-06 VITALS — BP 94/61 | HR 99 | Resp 16

## 2015-09-06 DIAGNOSIS — M779 Enthesopathy, unspecified: Secondary | ICD-10-CM

## 2015-09-06 DIAGNOSIS — E118 Type 2 diabetes mellitus with unspecified complications: Secondary | ICD-10-CM | POA: Insufficient documentation

## 2015-09-06 NOTE — Progress Notes (Signed)
   Subjective:    Patient ID: Betty Walters, female    DOB: 30-Nov-1960, 55 y.o.   MRN: [id number]  HPI: She presents today with chief complaint of pain around the second metatarsophalangeal joint of the left foot since January of this year. 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 She states that this fall caused damage to her knee which then resulted in the necessity of an arthroscopic knee surgery. She feels that her ambulation change has resulted in pain to the forefoot.    Review of Systems  Constitutional: Positive for fatigue.  HENT: Positive for tinnitus.   Musculoskeletal: Positive for back pain, arthralgias and gait problem.  Neurological: Positive for numbness and headaches.  All other systems reviewed and are negative.      Objective:   Physical Exam: Vital signs are stable she is alert and oriented 3 pulses are strongly palpable. Neurologic sensorium is intact. Deep tendon reflexes are intact. Muscle strength +5 over 5 dorsiflexion plantar flexors and inverters everters all intrinsic musculature is intact. Orthopedic evaluation demonstrates mild swelling to the dorsal and dorsal medial aspect of the second metatarsophalangeal joint area of the left foot. His mild dorsal contraction at the level of the second metatarsophalangeal joint and moderate to severe pain on end range of motion dorsiflexion and plantarflexion as well as pain direct palpation. Radiographs demonstrated an elongated second metatarsal with a shortened first metatarsal the good feet be the etiology of this. However I do feel that compensation from her left knee pain is resulting in her left foot pain and capsulitis. Fractures are not identified on radiographs today. Cutaneous evaluation of a straight supple well-hydrated cutis there is no erythema edema cellulitis drainage or odor to  the plantar aspect of the second metatarsophalangeal joint. Usually we would expect to see a tyloma here if this were primarily anatomical. No open lesions or wounds are noted.        Assessment & Plan:  Assessment: Capsulitis of the second metatarsophalangeal joint left foot possibly associated with left knee pain.  Plan: Discussed etiology pathology conservative versus surgical therapies. I injected the second metatarsophalangeal joint today periarticular [place] with Kenalog and local anesthetic. I placed her in a plantar fascia Darco shoe. Upon leaving the office she states that she felt much better already. I will follow-up with her in 1 month.

## 2015-10-16 ENCOUNTER — Ambulatory Visit (INDEPENDENT_AMBULATORY_CARE_PROVIDER_SITE_OTHER): Payer: BC Managed Care – PPO | Admitting: Podiatry

## 2015-10-16 ENCOUNTER — Encounter: Payer: Self-pay | Admitting: Podiatry

## 2015-10-16 DIAGNOSIS — M779 Enthesopathy, unspecified: Secondary | ICD-10-CM

## 2015-10-16 NOTE — Progress Notes (Signed)
She presents today and states that her second metatarsophalangeal joint capsulitis is approximate 70% improved. She states that she had to stop wearing her Darco shoe causing the smell. 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  Objective: Vital signs are stable alert and oriented 3. Pulses are palpable. She has pain on range of motion the second metatarsophalangeal joint left.  Assessment: Capsulitis second metatarsophalangeal joint left 70% improved.  Plan: I reinjected around the joint today with Kenalog and local anesthetic.

## 2015-11-20 ENCOUNTER — Ambulatory Visit: Payer: Self-pay | Admitting: Podiatry

## 2015-11-27 ENCOUNTER — Ambulatory Visit (INDEPENDENT_AMBULATORY_CARE_PROVIDER_SITE_OTHER): Payer: Self-pay | Admitting: Podiatry

## 2015-11-27 ENCOUNTER — Encounter: Payer: Self-pay | Admitting: Podiatry

## 2015-11-27 DIAGNOSIS — M722 Plantar fascial fibromatosis: Secondary | ICD-10-CM

## 2015-11-27 DIAGNOSIS — M779 Enthesopathy, unspecified: Secondary | ICD-10-CM

## 2015-11-27 MED ORDER — DICLOFENAC SODIUM 1 % TD GEL: 4.0000 g | Freq: Four times a day (QID) | TRANSDERMAL | 4 refills | Status: DC

## 2015-11-27 NOTE — Progress Notes (Signed)
She presents today for follow-up of some residual pain to the forefoot left. States that she's having some lateral pain and still has pain beneath the second and third metatarsophalangeal joint area of the left foot. She relates that she is also having some heel pain.  Objective: Vital signs are stable alert and oriented 3 pulses remain palpable. She still has pain on end range of motion of the second metatarsophalangeal joint of the left foot. No open lesions or wounds. That she does have pain on palpation mucoid drainage of above the left heel.  Assessment: Plantar fasciitis left heel. She also has compensatory forefoot symptoms around the second metatarsophalangeal joint. We have treated that in the past with steroid injections.  Plan: I injected the left heel today with Kenalog and local anesthetic encouraged her to continue conservative therapies.

## 2015-12-12 ENCOUNTER — Other Ambulatory Visit: Payer: Self-pay

## 2015-12-27 ENCOUNTER — Other Ambulatory Visit: Payer: Self-pay | Admitting: Family Medicine

## 2015-12-27 DIAGNOSIS — I10 Essential (primary) hypertension: Secondary | ICD-10-CM

## 2015-12-27 DIAGNOSIS — F32A Depression, unspecified: Secondary | ICD-10-CM

## 2015-12-27 DIAGNOSIS — E785 Hyperlipidemia, unspecified: Secondary | ICD-10-CM

## 2015-12-27 DIAGNOSIS — F329 Major depressive disorder, single episode, unspecified: Secondary | ICD-10-CM

## 2016-01-01 ENCOUNTER — Other Ambulatory Visit: Payer: Self-pay

## 2016-01-03 ENCOUNTER — Ambulatory Visit: Payer: BC Managed Care – PPO | Admitting: Family Medicine

## 2016-01-08 ENCOUNTER — Ambulatory Visit: Payer: BC Managed Care – PPO | Admitting: Podiatry

## 2016-01-11 ENCOUNTER — Ambulatory Visit (INDEPENDENT_AMBULATORY_CARE_PROVIDER_SITE_OTHER): Payer: BC Managed Care – PPO | Admitting: Family Medicine

## 2016-01-11 ENCOUNTER — Encounter: Payer: Self-pay | Admitting: Family Medicine

## 2016-01-11 VITALS — BP 128/60 | HR 70 | Ht 68.0 in | Wt 232.0 lb

## 2016-01-11 DIAGNOSIS — Z23 Encounter for immunization: Secondary | ICD-10-CM

## 2016-01-11 DIAGNOSIS — S53441A Ulnar collateral ligament sprain of right elbow, initial encounter: Secondary | ICD-10-CM

## 2016-01-11 DIAGNOSIS — J0100 Acute maxillary sinusitis, unspecified: Secondary | ICD-10-CM

## 2016-01-11 DIAGNOSIS — I10 Essential (primary) hypertension: Secondary | ICD-10-CM | POA: Diagnosis not present

## 2016-01-11 DIAGNOSIS — F330 Major depressive disorder, recurrent, mild: Secondary | ICD-10-CM

## 2016-01-11 DIAGNOSIS — E119 Type 2 diabetes mellitus without complications: Secondary | ICD-10-CM

## 2016-01-11 DIAGNOSIS — F3341 Major depressive disorder, recurrent, in partial remission: Secondary | ICD-10-CM

## 2016-01-11 DIAGNOSIS — E782 Mixed hyperlipidemia: Secondary | ICD-10-CM | POA: Diagnosis not present

## 2016-01-11 MED ORDER — SERTRALINE HCL 50 MG PO TABS: 50.0000 mg | ORAL_TABLET | Freq: Every day | ORAL | 6 refills | Status: DC

## 2016-01-11 MED ORDER — LISINOPRIL 20 MG PO TABS: ORAL_TABLET | ORAL | 6 refills | Status: DC

## 2016-01-11 MED ORDER — AZITHROMYCIN 250 MG PO TABS: ORAL_TABLET | ORAL | 0 refills | Status: DC

## 2016-01-11 MED ORDER — GLIPIZIDE 10 MG PO TABS: 10.0000 mg | ORAL_TABLET | Freq: Two times a day (BID) | ORAL | 6 refills | Status: DC

## 2016-01-11 MED ORDER — METFORMIN HCL 1000 MG PO TABS: 1000.0000 mg | ORAL_TABLET | Freq: Two times a day (BID) | ORAL | 6 refills | Status: DC

## 2016-01-11 MED ORDER — LOVASTATIN 20 MG PO TABS: ORAL_TABLET | ORAL | 6 refills | Status: DC

## 2016-01-11 MED ORDER — SERTRALINE HCL 100 MG PO TABS: 100.0000 mg | ORAL_TABLET | Freq: Every day | ORAL | 6 refills | Status: DC

## 2016-01-11 NOTE — Progress Notes (Signed)
Name: Betty Walters   MRN: [id number]    DOB: 1960-10-15   Date:01/11/2016       Progress Note  Subjective  Chief Complaint  Chief Complaint  Patient presents with  . Diabetes  . Hypertension  . Hyperlipidemia  . Depression  . thumb    reached in between dogs and thumb has hurt since    Diabetes  She presents for her follow-up diabetic visit. She has type 2 diabetes mellitus. Her disease course has been stable. Pertinent negatives for hypoglycemia include no confusion, dizziness, headaches, hunger, mood changes, nervousness/anxiousness, pallor, seizures, sleepiness, speech difficulty, sweats or tremors. Pertinent negatives for diabetes include no blurred vision, no chest pain, no fatigue, no foot paresthesias, no foot ulcerations, no polydipsia, no polyphagia, no polyuria, no visual change, no weakness and no weight loss. There are no hypoglycemic complications. Symptoms are worsening. There are no diabetic complications. Pertinent negatives for diabetic complications include no autonomic neuropathy, CVA, heart disease, nephropathy, peripheral neuropathy, PVD or retinopathy. Risk factors for coronary artery disease include diabetes mellitus and hypertension. Current diabetic treatment includes oral agent (dual therapy). She is compliant with treatment most of the time. Her weight is stable. She is following a generally healthy diet. She participates in exercise daily. Her breakfast blood glucose is taken between 8-9 am. Her breakfast blood glucose range is generally 140-180 mg/dl. An ACE inhibitor/angiotensin II receptor blocker is being taken. She does not see a podiatrist.Eye exam is not current.  Hypertension  This is a recurrent problem. The current episode started yesterday. The problem has been gradually improving since onset. The problem is controlled. Pertinent negatives include no anxiety, blurred vision, chest pain, headaches, malaise/fatigue, neck pain, orthopnea, palpitations,  peripheral edema, PND, shortness of breath or sweats. There are no associated agents to hypertension. Risk factors for coronary artery disease include dyslipidemia and obesity. Past treatments include ACE inhibitors. The current treatment provides mild improvement. There is no history of angina, kidney disease, CAD/MI, CVA, heart failure, left ventricular hypertrophy, PVD, renovascular disease or retinopathy. There is no history of chronic renal disease or a hypertension causing med.  Hyperlipidemia  This is a chronic problem. The problem is controlled. Recent lipid tests were reviewed and are variable. She has no history of chronic renal disease. Pertinent negatives include no chest pain, focal sensory loss, focal weakness, leg pain, myalgias or shortness of breath. She is currently on no antihyperlipidemic treatment. The current treatment provides mild improvement of lipids. There are no compliance problems.  Risk factors for coronary artery disease include diabetes mellitus and hypertension.  Depression         This is a chronic problem.  The current episode started more than 1 month ago.   The onset quality is gradual.   The problem occurs daily.  The problem has been waxing and waning since onset.  Associated symptoms include no decreased concentration, no fatigue, no helplessness, no hopelessness, does not have insomnia, not irritable, no restlessness, no decreased interest, no appetite change, no body aches, no myalgias, no headaches, no indigestion, not sad and no suicidal ideas.     The symptoms are aggravated by nothing.  Past treatments include SSRIs - Selective serotonin reuptake inhibitors.  Compliance with treatment is good.  Previous treatment provided moderate relief.   Pertinent negatives include no anxiety. Hand Injury   The incident occurred 5 to 7 days ago. Injury mechanism: separating dogs. Pain location: right thumb. Pertinent negatives include no chest pain or tingling.  Sinusitis   This is a new problem. The current episode started in the past 7 days. There has been no fever. The pain is mild. Associated symptoms include congestion and sinus pressure. Pertinent negatives include no chills, coughing, ear pain, headaches, neck pain, shortness of breath or sore throat. Past treatments include nothing.    No problem-specific Assessment & Plan notes found for this encounter.   Past Medical History:  Diagnosis Date  . Concussion May 2016  . Depression   . Diabetes mellitus without complication ([place])   . High cholesterol   . Hypertension   . Yeast infection     Past Surgical History:  Procedure Laterality Date  . CESAREAN SECTION    . GALLBLADDER SURGERY    . KNEE SURGERY    . WRIST SURGERY      Family History  Problem Relation Age of Onset  . Cancer Mother   . Diabetes Father     Social History   Social History  . Marital status: Married    Spouse name: N/A  . Number of children: N/A  . Years of education: N/A   Occupational History  . Not on file.   Social History Main Topics  . Smoking status: Never Smoker  . Smokeless tobacco: Never Used  . Alcohol use 0.0 oz/week  . Drug use: No  . Sexual activity: Yes   Other Topics Concern  . Not on file   Social History Narrative  . No narrative on file    Allergies  Allergen Reactions  . Amoxicillin-Pot Clavulanate Nausea And Vomiting     Review of Systems  Constitutional: Negative for appetite change, chills, fatigue, fever, malaise/fatigue and weight loss.  HENT: Positive for congestion and sinus pressure. Negative for ear discharge, ear pain and sore throat.   Eyes: Negative for blurred vision.  Respiratory: Negative for cough, sputum production, shortness of breath and wheezing.   Cardiovascular: Negative for chest pain, palpitations, orthopnea, leg swelling and PND.  Gastrointestinal: Negative for abdominal pain, blood in stool, constipation, diarrhea, heartburn, melena and nausea.   Genitourinary: Negative for dysuria, frequency, hematuria and urgency.  Musculoskeletal: Negative for back pain, joint pain, myalgias and neck pain.  Skin: Negative for pallor and rash.  Neurological: Negative for dizziness, tingling, tremors, sensory change, focal weakness, seizures, speech difficulty, weakness and headaches.  Endo/Heme/Allergies: Negative for environmental allergies, polydipsia and polyphagia. Does not bruise/bleed easily.  Psychiatric/Behavioral: Positive for depression. Negative for confusion, decreased concentration and suicidal ideas. The patient is not nervous/anxious and does not have insomnia.      Objective  Vitals:   01/11/16 1600  BP: 128/60  Pulse: 70  Weight: 232 lb (105.2 kg)  Height: 5\' 8"  (1.727 m)    Physical Exam  Constitutional: She is well-developed, well-nourished, and in no distress. She is not irritable. No distress.  HENT:  Head: Normocephalic and atraumatic.  Right Ear: External ear normal.  Left Ear: External ear normal.  Nose: Nose normal.  Mouth/Throat: Oropharynx is clear and moist.  Eyes: Conjunctivae and EOM are normal. Pupils are equal, round, and reactive to light. Right eye exhibits no discharge. Left eye exhibits no discharge.  Neck: Normal range of motion. Neck supple. No JVD present. No thyromegaly present.  Cardiovascular: Normal rate, regular rhythm, normal heart sounds and intact distal pulses.  Exam reveals no gallop and no friction rub.   No murmur heard. Pulmonary/Chest: Effort normal and breath sounds normal. She has no wheezes. She has no rales.  Abdominal: Soft. Bowel sounds are normal. She exhibits no mass. There is no tenderness. There is no guarding.  Musculoskeletal: Normal range of motion. She exhibits no edema.  Lymphadenopathy:    She has no cervical adenopathy.  Neurological: She is alert. She has normal reflexes.  Skin: Skin is warm and dry. She is not diaphoretic.  Psychiatric: Mood and affect normal.   Nursing note and vitals reviewed.     Assessment & Plan  Problem List Items Addressed This Visit      Cardiovascular and Mediastinum   Essential hypertension - Primary   Relevant Medications   lisinopril (PRINIVIL,ZESTRIL) 20 MG tablet   lovastatin (MEVACOR) 20 MG tablet   Other Relevant Orders   Renal Function Panel     Endocrine   Diabetes mellitus, type 2 (HCC)   Relevant Medications   glipiZIDE (GLUCOTROL) 10 MG tablet   metFORMIN (GLUCOPHAGE) 1000 MG tablet   lisinopril (PRINIVIL,ZESTRIL) 20 MG tablet   lovastatin (MEVACOR) 20 MG tablet   Other Relevant Orders   Hemoglobin A1c   Renal Function Panel     Other   Clinical depression   Relevant Medications   sertraline (ZOLOFT) 100 MG tablet   sertraline (ZOLOFT) 50 MG tablet   HLD (hyperlipidemia)   Relevant Medications   lisinopril (PRINIVIL,ZESTRIL) 20 MG tablet   lovastatin (MEVACOR) 20 MG tablet   Other Relevant Orders   Lipid Profile    Other Visit Diagnoses    Acute maxillary sinusitis, recurrence not specified       Relevant Medications   azithromycin (ZITHROMAX) 250 MG tablet   Sprain of ulnar collateral ligament of right elbow, initial encounter       Flu vaccine need       Relevant Orders   Flu Vaccine QUAD 36+ mos PF IM (Fluarix & Fluzone Quad PF) (Completed)        Dr. Deaken Jurgens [facility]  01/11/16

## 2016-01-12 LAB — RENAL FUNCTION PANEL
Albumin: 4.3 g/dL (ref 3.5–5.5)
BUN / CREAT RATIO: 28 — AB (ref 9–23)
BUN: 21 mg/dL (ref 6–24)
CALCIUM: 9.4 mg/dL (ref 8.7–10.2)
CO2: 22 mmol/L (ref 18–29)
CREATININE: 0.76 mg/dL (ref 0.57–1.00)
Chloride: 102 mmol/L (ref 96–106)
GFR calc non Af Amer: 90 mL/min/{1.73_m2} (ref >59)
GFR, EST AFRICAN AMERICAN: 104 mL/min/{1.73_m2} (ref >59)
Glucose: 87 mg/dL (ref 65–99)
Phosphorus: 3.5 mg/dL (ref 2.5–4.5)
Potassium: 4.1 mmol/L (ref 3.5–5.2)
Sodium: 142 mmol/L (ref 134–144)

## 2016-01-12 LAB — LIPID PANEL
CHOLESTEROL TOTAL: 197 mg/dL (ref 100–199)
Chol/HDL Ratio: 3.9 ratio units (ref 0.0–4.4)
HDL: 51 mg/dL (ref >39)
LDL CALC: 114 mg/dL — AB (ref 0–99)
Triglycerides: 160 mg/dL — ABNORMAL HIGH (ref 0–149)
VLDL CHOLESTEROL CAL: 32 mg/dL (ref 5–40)

## 2016-01-12 LAB — MICROALBUMIN / CREATININE URINE RATIO: Creatinine, Urine: 97.1 mg/dL

## 2016-01-12 LAB — HEMOGLOBIN A1C
Est. average glucose Bld gHb Est-mCnc: 154 mg/dL
HEMOGLOBIN A1C: 7 % — AB (ref 4.8–5.6)

## 2016-01-30 ENCOUNTER — Encounter: Payer: Self-pay | Admitting: Family Medicine

## 2016-01-30 ENCOUNTER — Ambulatory Visit (INDEPENDENT_AMBULATORY_CARE_PROVIDER_SITE_OTHER): Payer: BC Managed Care – PPO | Admitting: Family Medicine

## 2016-01-30 VITALS — BP 120/80 | HR 84 | Temp 97.9°F | Ht 68.0 in | Wt 230.0 lb

## 2016-01-30 DIAGNOSIS — J029 Acute pharyngitis, unspecified: Secondary | ICD-10-CM

## 2016-01-30 LAB — POCT RAPID STREP A (OFFICE): Rapid Strep A Screen: NEGATIVE

## 2016-01-30 MED ORDER — PENICILLIN V POTASSIUM 500 MG PO TABS: 500.0000 mg | ORAL_TABLET | Freq: Four times a day (QID) | ORAL | 0 refills | Status: DC

## 2016-01-30 NOTE — Progress Notes (Signed)
Name: Betty Walters   MRN: [id number]    DOB: 1960-12-10   Date:01/30/2016       Progress Note  Subjective  Chief Complaint  Chief Complaint  Patient presents with  . Sore Throat    Hurts when swallows. Started 2 days ago.    Sore Throat   This is a new problem. The current episode started in the past 7 days. The problem has been gradually worsening. Neither side of throat is experiencing more pain than the other. The maximum temperature recorded prior to her arrival was 100.4 - 100.9 F. The fever has been present for 1 to 2 days. The pain is at a severity of 9/10. The pain is mild. Associated symptoms include congestion, coughing, headaches, a hoarse voice, a plugged ear sensation, shortness of breath, stridor and swollen glands. Pertinent negatives include no abdominal pain, diarrhea, drooling, ear discharge, ear pain, neck pain, trouble swallowing or vomiting. She has had exposure to strep. She has had no exposure to mono. 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 She has tried acetaminophen for the symptoms. The treatment provided no relief.    No problem-specific Assessment & Plan notes found for this encounter.   Past Medical History:  Diagnosis Date  . Concussion May 2016  . Depression   . Diabetes mellitus without complication ([place])   . High cholesterol   . Hypertension   . Yeast infection     Past Surgical History:  Procedure Laterality Date  . CESAREAN SECTION    . GALLBLADDER SURGERY    . KNEE SURGERY    . WRIST SURGERY      Family History  Problem Relation Age of Onset  . Cancer Mother   . Diabetes Father     Social History   Social History  . Marital status: Married    Spouse name: N/A  . Number of children: N/A  . Years of education: N/A   Occupational History  . Not on file.   Social History Main Topics  . Smoking status: Never Smoker  . Smokeless tobacco: Never Used  . Alcohol use 0.0 oz/week  . Drug use: No  . Sexual activity: Yes   Other Topics  Concern  . Not on file   Social History Narrative  . No narrative on file    Allergies  Allergen Reactions  . Amoxicillin-Pot Clavulanate Nausea And Vomiting     Review of Systems  Constitutional: Negative for chills, fever, malaise/fatigue and weight loss.  HENT: Positive for congestion, hoarse voice and sore throat. Negative for drooling, ear discharge, ear pain, tinnitus and trouble swallowing.   Eyes: Negative for blurred vision.  Respiratory: Positive for cough, shortness of breath and stridor. Negative for sputum production and wheezing.   Cardiovascular: Negative for chest pain, palpitations and leg swelling.  Gastrointestinal: Negative for abdominal pain, blood in stool, constipation, diarrhea, heartburn, melena, nausea and vomiting.  Genitourinary: Negative for dysuria, frequency, hematuria and urgency.  Musculoskeletal: Negative for back pain, joint pain, myalgias and neck pain.  Skin: Negative for rash.  Neurological: Positive for headaches. Negative for dizziness, tingling, sensory change and focal weakness.  Endo/Heme/Allergies: Negative for environmental allergies and polydipsia. Does not bruise/bleed easily.  Psychiatric/Behavioral: Negative for depression and suicidal ideas. The patient is not nervous/anxious and does not have insomnia.      Objective  Vitals:   01/30/16 0921  BP: 120/80  Pulse: 84  Temp: 97.9 F (36.6 C)  Weight: 230 lb (104.3 kg)  Height: 5'  8" (1.727 m)    Physical Exam  Constitutional: She is well-developed, well-nourished, and in no distress. No distress.  HENT:  Head: Normocephalic and atraumatic.  Right Ear: External ear and ear canal normal. A middle ear effusion is present.  Left Ear: External ear and ear canal normal. A middle ear effusion is present.  Nose: Mucosal edema present.  Mouth/Throat: Mucous membranes are normal. Oropharyngeal exudate and posterior oropharyngeal erythema present. No posterior oropharyngeal edema or  tonsillar abscesses.  Eyes: Conjunctivae and EOM are normal. Pupils are equal, round, and reactive to light. Right eye exhibits no discharge. Left eye exhibits no discharge.  Neck: Normal range of motion. Neck supple. Normal carotid pulses, no hepatojugular reflux and no JVD present. Carotid bruit is not present. No thyromegaly present.  Cardiovascular: Normal rate, regular rhythm, normal heart sounds and intact distal pulses.  Exam reveals no gallop and no friction rub.   No murmur heard. Pulmonary/Chest: Effort normal and breath sounds normal.  Abdominal: Soft. Bowel sounds are normal. She exhibits no mass. There is no hepatosplenomegaly. There is no tenderness. There is no guarding.  Musculoskeletal: Normal range of motion. She exhibits no edema.  Lymphadenopathy:       Head (right side): Submental and submandibular adenopathy present.       Head (left side): Submental and submandibular adenopathy present.    She has no cervical adenopathy.  Neurological: She is alert. She has normal reflexes.  Skin: Skin is warm and dry. She is not diaphoretic. No cyanosis. Nails show no clubbing.  Psychiatric: Mood and affect normal.  Nursing note and vitals reviewed.     Assessment & Plan  Problem List Items Addressed This Visit    None    Visit Diagnoses    Pharyngitis, unspecified etiology    -  Primary   Relevant Orders   POCT rapid strep A (Completed)        Dr. [facility]  01/30/16

## 2016-02-05 ENCOUNTER — Other Ambulatory Visit: Payer: Self-pay

## 2016-02-09 ENCOUNTER — Other Ambulatory Visit: Payer: Self-pay

## 2016-02-09 DIAGNOSIS — J0100 Acute maxillary sinusitis, unspecified: Secondary | ICD-10-CM

## 2016-02-09 MED ORDER — AZITHROMYCIN 250 MG PO TABS: ORAL_TABLET | ORAL | 0 refills | Status: DC

## 2016-02-15 ENCOUNTER — Other Ambulatory Visit: Payer: Self-pay

## 2016-02-26 ENCOUNTER — Other Ambulatory Visit: Payer: Self-pay

## 2016-04-16 ENCOUNTER — Encounter: Payer: Self-pay | Admitting: Family Medicine

## 2016-04-16 ENCOUNTER — Ambulatory Visit (INDEPENDENT_AMBULATORY_CARE_PROVIDER_SITE_OTHER): Payer: BC Managed Care – PPO | Admitting: Family Medicine

## 2016-04-16 VITALS — BP 104/70 | HR 80 | Temp 99.5°F | Ht 68.0 in | Wt 235.0 lb

## 2016-04-16 DIAGNOSIS — R509 Fever, unspecified: Secondary | ICD-10-CM

## 2016-04-16 DIAGNOSIS — J40 Bronchitis, not specified as acute or chronic: Secondary | ICD-10-CM

## 2016-04-16 DIAGNOSIS — J0100 Acute maxillary sinusitis, unspecified: Secondary | ICD-10-CM

## 2016-04-16 LAB — POCT INFLUENZA A/B
Influenza A, POC: NEGATIVE
Influenza B, POC: NEGATIVE

## 2016-04-16 MED ORDER — GUAIFENESIN-CODEINE 100-10 MG/5ML PO SYRP: 5.0000 mL | ORAL_SOLUTION | Freq: Three times a day (TID) | ORAL | 0 refills | Status: DC | PRN

## 2016-04-16 MED ORDER — AZITHROMYCIN 250 MG PO TABS: ORAL_TABLET | ORAL | 0 refills | Status: DC

## 2016-04-16 NOTE — Progress Notes (Signed)
Name: Betty Walters   MRN: [id number]    DOB: 07-Mar-1961   Date:04/16/2016       Progress Note  Subjective  Chief Complaint  Chief Complaint  Patient presents with  . Sinusitis    dry cough, "rattling at night", headache, sore throat, body aches x 2 days    Sinusitis  This is a new problem. The current episode started in the past 7 days. The problem has been gradually worsening since onset. The maximum temperature recorded prior to her arrival was 100.4 - 100.9 F. The fever has been present for 1 to 2 days. The pain is mild. Associated symptoms include chills, congestion, coughing, diaphoresis, headaches, shortness of breath, sinus pressure, a sore throat and swollen glands. Pertinent negatives include no ear pain, hoarse voice, neck pain or sneezing. (Facial pain) Past treatments include oral decongestants (ibuprofen). The treatment provided moderate relief.    No problem-specific Assessment & Plan notes found for this encounter.   Past Medical History:  Diagnosis Date  . Concussion May 2016  . Depression   . Diabetes mellitus without complication ([place])   . High cholesterol   . Hypertension   . Yeast infection     Past Surgical History:  Procedure Laterality Date  . CESAREAN SECTION    . GALLBLADDER SURGERY    . KNEE SURGERY    . WRIST SURGERY      Family History  Problem Relation Age of Onset  . Cancer Mother   . Diabetes Father     Social History   Social History  . Marital status: Married    Spouse name: N/A  . Number of children: N/A  . Years of education: N/A   Occupational History  . Not on file.   Social History Main Topics  . Smoking status: Never Smoker  . Smokeless tobacco: Never Used  . Alcohol use 0.0 oz/week  . Drug use: No  . Sexual activity: Yes   Other Topics Concern  . Not on file   Social History Narrative  . No narrative on file    Allergies  Allergen Reactions  . Amoxicillin-Pot Clavulanate Nausea And Vomiting     Review  of Systems  Constitutional: Positive for chills, diaphoresis, fever and malaise/fatigue. Negative for weight loss.  HENT: Positive for congestion, sinus pain, sinus pressure and sore throat. Negative for ear discharge, ear pain, hoarse voice, nosebleeds, sneezing and tinnitus.   Eyes: Negative for blurred vision and pain.  Respiratory: Positive for cough and shortness of breath. Negative for sputum production and wheezing.   Cardiovascular: Negative for chest pain, palpitations and leg swelling.  Gastrointestinal: Negative for abdominal pain, blood in stool, constipation, diarrhea, heartburn, melena and nausea.  Genitourinary: Negative for dysuria, frequency, hematuria and urgency.  Musculoskeletal: Negative for back pain, joint pain, myalgias and neck pain.  Skin: Negative for itching and rash.  Neurological: Positive for headaches. Negative for dizziness, tingling, sensory change and focal weakness.  Endo/Heme/Allergies: Negative for environmental allergies and polydipsia. Does not bruise/bleed easily.  Psychiatric/Behavioral: Negative for depression and suicidal ideas. The patient is not nervous/anxious and does not have insomnia.      Objective  Vitals:   04/16/16 0921  BP: 104/70  Pulse: 80  Temp: 99.5 F (37.5 C)  TempSrc: Oral  SpO2: 99%  Weight: 235 lb (106.6 kg)  Height: 5\' 8"  (1.727 m)    Physical Exam  Constitutional: She is well-developed, well-nourished, and in no distress. No distress.  HENT:  Head:  Normocephalic and atraumatic.  Right Ear: External ear and ear canal normal. Tympanic membrane is retracted.  Left Ear: External ear and ear canal normal. A middle ear effusion is present.  Nose: Mucosal edema present.  Mouth/Throat: Posterior oropharyngeal erythema present. No oropharyngeal exudate or posterior oropharyngeal edema.  Eyes: Conjunctivae and EOM are normal. Pupils are equal, round, and reactive to light. Right eye exhibits no discharge. Left eye exhibits  no discharge.  Neck: Normal range of motion. Neck supple. No JVD present. No thyromegaly present.  Cardiovascular: Normal rate, regular rhythm, normal heart sounds and intact distal pulses.  Exam reveals no gallop and no friction rub.   No murmur heard. Pulmonary/Chest: Effort normal and breath sounds normal.  Abdominal: Soft. Bowel sounds are normal. She exhibits no mass. There is no tenderness. There is no guarding.  Musculoskeletal: Normal range of motion. She exhibits no edema.  Lymphadenopathy:    She has no cervical adenopathy.  Neurological: She is alert. She has normal reflexes.  Skin: Skin is warm and dry. She is not diaphoretic.  Psychiatric: Mood and affect normal.  Nursing note and vitals reviewed.     Assessment & Plan  Problem List Items Addressed This Visit    None    Visit Diagnoses    Acute non-recurrent maxillary sinusitis    -  Primary   Relevant Medications   azithromycin (ZITHROMAX) 250 MG tablet   guaiFENesin-codeine (ROBITUSSIN AC) 100-10 MG/5ML syrup   Bronchitis       Relevant Medications   guaiFENesin-codeine (ROBITUSSIN AC) 100-10 MG/5ML syrup   Acute maxillary sinusitis, recurrence not specified       Relevant Medications   azithromycin (ZITHROMAX) 250 MG tablet   guaiFENesin-codeine (ROBITUSSIN AC) 100-10 MG/5ML syrup   Fever, unspecified fever cause       Relevant Orders   POCT Influenza A/B (Completed)        Dr. Gunner [facility]  04/16/16

## 2016-05-13 ENCOUNTER — Encounter: Payer: Self-pay | Admitting: Family Medicine

## 2016-05-13 ENCOUNTER — Ambulatory Visit (INDEPENDENT_AMBULATORY_CARE_PROVIDER_SITE_OTHER): Payer: BC Managed Care – PPO | Admitting: Family Medicine

## 2016-05-13 VITALS — BP 102/74 | HR 97 | Temp 99.0°F | Ht 68.0 in | Wt 222.0 lb

## 2016-05-13 DIAGNOSIS — R69 Illness, unspecified: Secondary | ICD-10-CM | POA: Diagnosis not present

## 2016-05-13 DIAGNOSIS — J40 Bronchitis, not specified as acute or chronic: Secondary | ICD-10-CM | POA: Diagnosis not present

## 2016-05-13 DIAGNOSIS — J111 Influenza due to unidentified influenza virus with other respiratory manifestations: Secondary | ICD-10-CM

## 2016-05-13 MED ORDER — AZITHROMYCIN 250 MG PO TABS: ORAL_TABLET | ORAL | 0 refills | Status: DC

## 2016-05-13 MED ORDER — OSELTAMIVIR PHOSPHATE 75 MG PO CAPS: 75.0000 mg | ORAL_CAPSULE | Freq: Two times a day (BID) | ORAL | 0 refills | Status: DC

## 2016-05-13 NOTE — Progress Notes (Signed)
Name: Betty Walters   MRN: [id number]    DOB: 1960-12-24   Date:05/13/2016       Progress Note  Subjective  Chief Complaint  Chief Complaint  Patient presents with  . Cough    Pt complains of bad cough with body aches, and night sweats. Husband and son was positive for flu.    Cough  This is a new problem. The current episode started in the past 7 days. The problem has been gradually worsening. The cough is productive of purulent sputum (yellow). Associated symptoms include a fever, headaches, myalgias and sweats. Pertinent negatives include no chest pain, chills, ear congestion, ear pain, heartburn, hemoptysis, nasal congestion, postnasal drip, rash, rhinorrhea, sore throat, shortness of breath, weight loss or wheezing. Nothing aggravates the symptoms. She has tried OTC cough suppressant for the symptoms. The treatment provided no relief. There is no history of environmental allergies.    No problem-specific Assessment & Plan notes found for this encounter.   Past Medical History:  Diagnosis Date  . Concussion May 2016  . Depression   . Diabetes mellitus without complication ([place])   . High cholesterol   . Hypertension   . Yeast infection     Past Surgical History:  Procedure Laterality Date  . CESAREAN SECTION    . GALLBLADDER SURGERY    . KNEE SURGERY    . WRIST SURGERY      Family History  Problem Relation Age of Onset  . Cancer Mother   . Diabetes Father     Social History   Social History  . Marital status: Married    Spouse name: N/A  . Number of children: N/A  . Years of education: N/A   Occupational History  . Not on file.   Social History Main Topics  . Smoking status: Never Smoker  . Smokeless tobacco: Never Used  . Alcohol use 0.0 oz/week  . Drug use: No  . Sexual activity: Yes   Other Topics Concern  . Not on file   Social History Narrative  . No narrative on file    Allergies  Allergen Reactions  . Amoxicillin-Pot Clavulanate Nausea  And Vomiting     Review of Systems  Constitutional: Positive for fever. Negative for chills, malaise/fatigue and weight loss.  HENT: Negative for ear discharge, ear pain, postnasal drip, rhinorrhea and sore throat.   Eyes: Negative for blurred vision.  Respiratory: Positive for cough. Negative for hemoptysis, sputum production, shortness of breath and wheezing.   Cardiovascular: Negative for chest pain, palpitations and leg swelling.  Gastrointestinal: Negative for abdominal pain, blood in stool, constipation, diarrhea, heartburn, melena and nausea.  Genitourinary: Negative for dysuria, frequency, hematuria and urgency.  Musculoskeletal: Positive for myalgias. Negative for back pain, joint pain and neck pain.  Skin: Negative for rash.  Neurological: Positive for headaches. Negative for dizziness, tingling, sensory change and focal weakness.  Endo/Heme/Allergies: Negative for environmental allergies and polydipsia. Does not bruise/bleed easily.  Psychiatric/Behavioral: Negative for depression and suicidal ideas. The patient is not nervous/anxious and does not have insomnia.      Objective  Vitals:   05/13/16 1548  BP: 102/74  Pulse: 97  Temp: 99 F (37.2 C)  SpO2: 97%  Weight: 222 lb (100.7 kg)  Height: 5\' 8"  (1.727 m)    Physical Exam  Constitutional: She is well-developed, well-nourished, and in no distress. No distress.  HENT:  Head: Normocephalic and atraumatic.  Right Ear: Tympanic membrane, external ear and ear canal normal.  Left Ear: Tympanic membrane, external ear and ear canal normal.  Nose: Nose normal.  Mouth/Throat: Posterior oropharyngeal erythema present. No oropharyngeal exudate or posterior oropharyngeal edema.  Eyes: Conjunctivae and EOM are normal. Pupils are equal, round, and reactive to light. Right eye exhibits no discharge. Left eye exhibits no discharge.  Neck: Normal range of motion. Neck supple. No JVD present. No thyromegaly present.   Cardiovascular: Normal rate, regular rhythm, normal heart sounds and intact distal pulses.  Exam reveals no gallop and no friction rub.   No murmur heard. Pulmonary/Chest: Effort normal and breath sounds normal. She has no wheezes. She has no rales.  Abdominal: Soft. Bowel sounds are normal. She exhibits no mass. There is no tenderness. There is no guarding.  Musculoskeletal: Normal range of motion. She exhibits no edema.  Lymphadenopathy:    She has no cervical adenopathy.  Neurological: She is alert.  Skin: Skin is warm and dry. She is not diaphoretic.  Psychiatric: Mood and affect normal.  Nursing note and vitals reviewed.     Assessment & Plan  Problem List Items Addressed This Visit    None    Visit Diagnoses    Influenza-like illness    -  Primary   Relevant Medications   oseltamivir (TAMIFLU) 75 MG capsule   Bronchitis       Relevant Medications   azithromycin (ZITHROMAX) 250 MG tablet        Dr. [facility]  05/13/16

## 2016-08-01 ENCOUNTER — Ambulatory Visit (INDEPENDENT_AMBULATORY_CARE_PROVIDER_SITE_OTHER): Payer: BC Managed Care – PPO | Admitting: Family Medicine

## 2016-08-01 ENCOUNTER — Encounter: Payer: Self-pay | Admitting: Family Medicine

## 2016-08-01 VITALS — BP 120/80 | HR 72 | Temp 98.2°F | Ht 68.0 in | Wt 227.0 lb

## 2016-08-01 DIAGNOSIS — R21 Rash and other nonspecific skin eruption: Secondary | ICD-10-CM | POA: Diagnosis not present

## 2016-08-01 DIAGNOSIS — W57XXXA Bitten or stung by nonvenomous insect and other nonvenomous arthropods, initial encounter: Secondary | ICD-10-CM | POA: Diagnosis not present

## 2016-08-01 MED ORDER — MUPIROCIN 2 % EX OINT: 1.0000 "application " | TOPICAL_OINTMENT | Freq: Two times a day (BID) | CUTANEOUS | 0 refills | Status: DC

## 2016-08-01 MED ORDER — DESOXIMETASONE 0.05 % EX CREA: TOPICAL_CREAM | Freq: Two times a day (BID) | CUTANEOUS | 0 refills | Status: DC

## 2016-08-01 MED ORDER — DOXYCYCLINE HYCLATE 100 MG PO TABS: 100.0000 mg | ORAL_TABLET | Freq: Two times a day (BID) | ORAL | 0 refills | Status: DC

## 2016-08-01 NOTE — Progress Notes (Signed)
Name: Betty Walters   MRN: [id number]    DOB: 09-Jul-1960   Date:08/01/2016       Progress Note  Subjective  Chief Complaint  Chief Complaint  Patient presents with  . Insect Bite    3 bites on R) upper arm- noticed them yesterday- itches    Rash  This is a new problem. The current episode started yesterday. The problem has been gradually worsening since onset. The affected locations include the right arm. The rash is characterized by burning, itchiness and redness. Associated with: felt a sting. Pertinent negatives include no anorexia, congestion, cough, diarrhea, eye pain, facial edema, fatigue, fever, joint pain, nail changes, rhinorrhea, shortness of breath, sore throat or vomiting. Past treatments include topical steroids. The treatment provided moderate relief.    No problem-specific Assessment & Plan notes found for this encounter.   Past Medical History:  Diagnosis Date  . Concussion May 2016  . Depression   . Diabetes mellitus without complication ([place])   . High cholesterol   . Hypertension   . Yeast infection     Past Surgical History:  Procedure Laterality Date  . CESAREAN SECTION    . GALLBLADDER SURGERY    . KNEE SURGERY    . WRIST SURGERY      Family History  Problem Relation Age of Onset  . Cancer Mother   . Diabetes Father     Social History   Social History  . Marital status: Married    Spouse name: N/A  . Number of children: N/A  . Years of education: N/A   Occupational History  . Not on file.   Social History Main Topics  . Smoking status: Never Smoker  . Smokeless tobacco: Never Used  . Alcohol use 0.0 oz/week  . Drug use: No  . Sexual activity: Yes   Other Topics Concern  . Not on file   Social History Narrative  . No narrative on file    Allergies  Allergen Reactions  . Amoxicillin-Pot Clavulanate Nausea And Vomiting    Outpatient Medications Prior to Visit  Medication Sig Dispense Refill  . glipiZIDE (GLUCOTROL) 10 MG  tablet Take 1 tablet (10 mg total) by mouth 2 (two) times daily. 60 tablet 6  . lisinopril (PRINIVIL,ZESTRIL) 20 MG tablet TAKE ONE TABLET BY MOUTH ONCE DAILY. NEEDS TO SCHEDULE APPOINTMENT FOR FURTHER REFILLS 30 tablet 6  . lovastatin (MEVACOR) 20 MG tablet TAKE ONE TABLET BY MOUTH IN THE MORNING---SCHEDULE APPOINTMENT TO BE SEEN 30 tablet 6  . metFORMIN (GLUCOPHAGE) 1000 MG tablet Take 1 tablet (1,000 mg total) by mouth 2 (two) times daily. 60 tablet 6  . sertraline (ZOLOFT) 100 MG tablet Take 1 tablet (100 mg total) by mouth daily. Pt to take total of 150mg  daily 30 tablet 6  . sertraline (ZOLOFT) 50 MG tablet Take 1 tablet (50 mg total) by mouth daily. 30 tablet 6  . azithromycin (ZITHROMAX) 250 MG tablet 2 today then1 a day for 4 day 6 tablet 0  . azithromycin (ZITHROMAX) 250 MG tablet 2 tablets today then 1 a day for 4 days 6 tablet 0  . guaiFENesin-codeine (ROBITUSSIN AC) 100-10 MG/5ML syrup Take 5 mLs by mouth 3 (three) times daily as needed for cough. 150 mL 0  . oseltamivir (TAMIFLU) 75 MG capsule Take 1 capsule (75 mg total) by mouth 2 (two) times daily. 10 capsule 0   No facility-administered medications prior to visit.     Review of Systems  Constitutional:  Negative for chills, fatigue, fever, malaise/fatigue and weight loss.  HENT: Negative for congestion, ear discharge, ear pain, rhinorrhea and sore throat.   Eyes: Negative for blurred vision and pain.  Respiratory: Negative for cough, sputum production, shortness of breath and wheezing.   Cardiovascular: Negative for chest pain, palpitations and leg swelling.  Gastrointestinal: Negative for abdominal pain, anorexia, blood in stool, constipation, diarrhea, heartburn, melena, nausea and vomiting.  Genitourinary: Negative for dysuria, frequency, hematuria and urgency.  Musculoskeletal: Negative for back pain, joint pain, myalgias and neck pain.  Skin: Positive for rash. Negative for nail changes.  Neurological: Negative for  dizziness, tingling, sensory change, focal weakness and headaches.  Endo/Heme/Allergies: Negative for environmental allergies and polydipsia. Does not bruise/bleed easily.  Psychiatric/Behavioral: Negative for depression and suicidal ideas. The patient is not nervous/anxious and does not have insomnia.      Objective  Vitals:   08/01/16 1342  BP: 120/80  Pulse: 72  Temp: 98.2 F (36.8 C)  TempSrc: Oral  Weight: 227 lb (103 kg)  Height: 5\' 8"  (1.727 m)    Physical Exam  Constitutional: She is well-developed, well-nourished, and in no distress. No distress.  HENT:  Head: Normocephalic and atraumatic.  Right Ear: External ear normal.  Left Ear: External ear normal.  Nose: Nose normal.  Mouth/Throat: Oropharynx is clear and moist.  Eyes: Conjunctivae and EOM are normal. Pupils are equal, round, and reactive to light. Right eye exhibits no discharge. Left eye exhibits no discharge.  Neck: Normal range of motion. Neck supple. No JVD present. No thyromegaly present.  Cardiovascular: Normal rate, regular rhythm, normal heart sounds and intact distal pulses.  Exam reveals no gallop and no friction rub.   No murmur heard. Pulmonary/Chest: Effort normal and breath sounds normal.  Abdominal: Soft. Bowel sounds are normal. She exhibits no mass. There is no tenderness. There is no guarding.  Musculoskeletal: Normal range of motion. She exhibits no edema.  Lymphadenopathy:    She has no cervical adenopathy.  Neurological: She is alert.  Skin: Skin is warm and dry. She is not diaphoretic. There is erythema.  Psychiatric: Mood and affect normal.  Nursing note and vitals reviewed.     Assessment & Plan  Problem List Items Addressed This Visit    None    Visit Diagnoses    Insect bite, initial encounter    -  Primary   Relevant Medications   doxycycline (VIBRA-TABS) 100 MG tablet   mupirocin ointment (BACTROBAN) 2 %   desoximetasone (TOPICORT) 0.05 % cream      Meds ordered  this encounter  Medications  . doxycycline (VIBRA-TABS) 100 MG tablet    Sig: Take 1 tablet (100 mg total) by mouth 2 (two) times daily.    Dispense:  14 tablet    Refill:  0  . mupirocin ointment (BACTROBAN) 2 %    Sig: Place 1 application into the nose 2 (two) times daily.    Dispense:  22 g    Refill:  0  . desoximetasone (TOPICORT) 0.05 % cream    Sig: Apply topically 2 (two) times daily.    Dispense:  30 g    Refill:  0      Dr. [facility]  08/01/16

## 2016-08-29 ENCOUNTER — Other Ambulatory Visit: Payer: Self-pay | Admitting: Family Medicine

## 2016-08-29 DIAGNOSIS — F3341 Major depressive disorder, recurrent, in partial remission: Secondary | ICD-10-CM

## 2016-09-16 ENCOUNTER — Telehealth: Payer: Self-pay | Admitting: Family Medicine

## 2016-09-16 ENCOUNTER — Other Ambulatory Visit: Payer: Self-pay | Admitting: Family Medicine

## 2016-09-16 DIAGNOSIS — E782 Mixed hyperlipidemia: Secondary | ICD-10-CM

## 2016-09-16 DIAGNOSIS — F3341 Major depressive disorder, recurrent, in partial remission: Secondary | ICD-10-CM

## 2016-09-16 DIAGNOSIS — I10 Essential (primary) hypertension: Secondary | ICD-10-CM

## 2016-09-16 NOTE — Telephone Encounter (Signed)
Called in meds x 30 days- MUST KEEP APPT

## 2016-09-16 NOTE — Telephone Encounter (Signed)
Patient is scheduled for a diabetes and medrefill this coming Friday, but will need immediate refills for lisinopril,lovastatin, and both sertraline-Please advise.

## 2016-09-20 ENCOUNTER — Ambulatory Visit (INDEPENDENT_AMBULATORY_CARE_PROVIDER_SITE_OTHER): Payer: BC Managed Care – PPO | Admitting: Family Medicine

## 2016-09-20 ENCOUNTER — Encounter: Payer: Self-pay | Admitting: Family Medicine

## 2016-09-20 VITALS — BP 120/70 | HR 64 | Ht 68.0 in | Wt 220.0 lb

## 2016-09-20 DIAGNOSIS — E782 Mixed hyperlipidemia: Secondary | ICD-10-CM

## 2016-09-20 DIAGNOSIS — M79674 Pain in right toe(s): Secondary | ICD-10-CM | POA: Diagnosis not present

## 2016-09-20 DIAGNOSIS — E6609 Other obesity due to excess calories: Secondary | ICD-10-CM

## 2016-09-20 DIAGNOSIS — G8929 Other chronic pain: Secondary | ICD-10-CM | POA: Diagnosis not present

## 2016-09-20 DIAGNOSIS — E119 Type 2 diabetes mellitus without complications: Secondary | ICD-10-CM

## 2016-09-20 DIAGNOSIS — E66811 Obesity, class 1: Secondary | ICD-10-CM

## 2016-09-20 DIAGNOSIS — I10 Essential (primary) hypertension: Secondary | ICD-10-CM | POA: Diagnosis not present

## 2016-09-20 DIAGNOSIS — M79675 Pain in left toe(s): Secondary | ICD-10-CM

## 2016-09-20 DIAGNOSIS — F3341 Major depressive disorder, recurrent, in partial remission: Secondary | ICD-10-CM | POA: Diagnosis not present

## 2016-09-20 DIAGNOSIS — Z6833 Body mass index (BMI) 33.0-33.9, adult: Secondary | ICD-10-CM | POA: Diagnosis not present

## 2016-09-20 MED ORDER — LOVASTATIN 20 MG PO TABS: ORAL_TABLET | ORAL | 6 refills | Status: DC

## 2016-09-20 MED ORDER — LISINOPRIL 20 MG PO TABS: ORAL_TABLET | ORAL | 6 refills | Status: DC

## 2016-09-20 MED ORDER — SERTRALINE HCL 100 MG PO TABS: ORAL_TABLET | ORAL | 6 refills | Status: DC

## 2016-09-20 MED ORDER — GLIPIZIDE 10 MG PO TABS: 10.0000 mg | ORAL_TABLET | Freq: Two times a day (BID) | ORAL | 6 refills | Status: DC

## 2016-09-20 MED ORDER — METFORMIN HCL 1000 MG PO TABS: 1000.0000 mg | ORAL_TABLET | Freq: Two times a day (BID) | ORAL | 6 refills | Status: DC

## 2016-09-20 MED ORDER — SERTRALINE HCL 50 MG PO TABS: 50.0000 mg | ORAL_TABLET | Freq: Every day | ORAL | 6 refills | Status: DC

## 2016-09-20 NOTE — Progress Notes (Signed)
Name: Betty Walters   MRN: [id number]    DOB: 11-22-1960   Date:09/20/2016       Progress Note  Subjective  Chief Complaint  Chief Complaint  Patient presents with  . Diabetes  . Hypertension  . Hyperlipidemia  . Depression  . plantar fasciatis    wants a refill on etodolac    Diabetes  She presents for her follow-up diabetic visit. She has type 2 diabetes mellitus. Her disease course has been stable. There are no hypoglycemic associated symptoms. Pertinent negatives for hypoglycemia include no dizziness, headaches or nervousness/anxiousness. Pertinent negatives for diabetes include no blurred vision, no chest pain, no fatigue, no foot paresthesias, no foot ulcerations, no polydipsia, no polyphagia, no polyuria, no visual change, no weakness and no weight loss. There are no hypoglycemic complications. Symptoms are stable. There are no diabetic complications. Pertinent negatives for diabetic complications include no CVA, PVD or retinopathy. Risk factors for coronary artery disease include dyslipidemia and obesity. Current diabetic treatment includes oral agent (dual therapy). She is compliant with treatment most of the time. Her weight is stable. She is following a generally healthy diet. She participates in exercise intermittently. Her breakfast blood glucose is taken between 8-9 am. Her breakfast blood glucose range is generally 140-180 mg/dl. An ACE inhibitor/angiotensin II receptor blocker is being taken. She does not see a podiatrist.Eye exam is not current.  Hypertension  This is a chronic problem. The problem is controlled. Pertinent negatives include no anxiety, blurred vision, chest pain, headaches, malaise/fatigue, neck pain, orthopnea, palpitations, PND or shortness of breath. There are no associated agents to hypertension. There is no history of angina, kidney disease, CAD/MI, CVA, heart failure, left ventricular hypertrophy, PVD or retinopathy. There is no history of chronic renal  disease, a hypertension causing med or renovascular disease.  Hyperlipidemia  This is a chronic problem. The problem is controlled. Recent lipid tests were reviewed and are normal. Exacerbating diseases include obesity. She has no history of chronic renal disease. Pertinent negatives include no chest pain, focal sensory loss, focal weakness, leg pain, myalgias or shortness of breath. She is currently on no antihyperlipidemic treatment.  Depression       The patient presents with depression.  This is a chronic problem.  The onset quality is gradual.   The problem has been waxing and waning since onset.  Associated symptoms include no decreased concentration, no fatigue, does not have insomnia, not irritable, no restlessness, no decreased interest, no myalgias, no headaches and no suicidal ideas.  Past treatments include SSRIs - Selective serotonin reuptake inhibitors.  Past medical history includes depression.     Pertinent negatives include no anxiety. Foot Injury   There was no injury mechanism. The pain is present in the left foot and right foot. The pain is at a severity of 2/10. The pain is moderate. The pain has been improving since onset. Pertinent negatives include no tingling. She has tried NSAIDs for the symptoms.    No problem-specific Assessment & Plan notes found for this encounter.   Past Medical History:  Diagnosis Date  . Concussion May 2016  . Depression   . Diabetes mellitus without complication ([place])   . High cholesterol   . Hypertension   . Yeast infection     Past Surgical History:  Procedure Laterality Date  . CESAREAN SECTION    . GALLBLADDER SURGERY    . KNEE SURGERY    . WRIST SURGERY      Family History  Problem Relation Age of Onset  . Cancer Mother   . Diabetes Father     Social History   Social History  . Marital status: Married    Spouse name: N/A  . Number of children: N/A  . Years of education: N/A   Occupational History  . Not on file.    Social History Main Topics  . Smoking status: Never Smoker  . Smokeless tobacco: Never Used  . Alcohol use 0.0 oz/week  . Drug use: No  . Sexual activity: Yes   Other Topics Concern  . Not on file   Social History Narrative  . No narrative on file    Allergies  Allergen Reactions  . Amoxicillin-Pot Clavulanate Nausea And Vomiting    Outpatient Medications Prior to Visit  Medication Sig Dispense Refill  . glipiZIDE (GLUCOTROL) 10 MG tablet Take 1 tablet (10 mg total) by mouth 2 (two) times daily. 60 tablet 6  . lisinopril (PRINIVIL,ZESTRIL) 20 MG tablet TAKE ONE TABLET BY MOUTH ONCE DAILY. NEEDS TO SCHEDULE APPOINTMENT FOR FURTHER REFILLS 30 tablet 6  . lovastatin (MEVACOR) 20 MG tablet TAKE ONE TABLET BY MOUTH IN THE MORNING---SCHEDULE APPOINTMENT TO BE SEEN 30 tablet 6  . metFORMIN (GLUCOPHAGE) 1000 MG tablet Take 1 tablet (1,000 mg total) by mouth 2 (two) times daily. 60 tablet 6  . sertraline (ZOLOFT) 100 MG tablet TAKE ONE TABLET BY MOUTH ONCE DAILY. PATIENT TO TAKE A TOTAL OF 150 MG DAILY. 30 tablet 1  . sertraline (ZOLOFT) 50 MG tablet Take 1 tablet (50 mg total) by mouth daily. 30 tablet 6  . desoximetasone (TOPICORT) 0.05 % cream Apply topically 2 (two) times daily. 30 g 0  . doxycycline (VIBRA-TABS) 100 MG tablet Take 1 tablet (100 mg total) by mouth 2 (two) times daily. 14 tablet 0  . mupirocin ointment (BACTROBAN) 2 % Place 1 application into the nose 2 (two) times daily. 22 g 0   No facility-administered medications prior to visit.     Review of Systems  Constitutional: Negative for chills, fatigue, fever, malaise/fatigue and weight loss.  HENT: Negative for ear discharge, ear pain and sore throat.   Eyes: Negative for blurred vision.  Respiratory: Negative for cough, sputum production, shortness of breath and wheezing.   Cardiovascular: Negative for chest pain, palpitations, orthopnea, leg swelling and PND.  Gastrointestinal: Negative for abdominal pain, blood  in stool, constipation, diarrhea, heartburn, melena and nausea.  Genitourinary: Negative for dysuria, frequency, hematuria and urgency.  Musculoskeletal: Negative for back pain, joint pain, myalgias and neck pain.  Skin: Negative for rash.  Neurological: Negative for dizziness, tingling, sensory change, focal weakness, weakness and headaches.  Endo/Heme/Allergies: Negative for environmental allergies, polydipsia and polyphagia. Does not bruise/bleed easily.  Psychiatric/Behavioral: Positive for depression. Negative for decreased concentration and suicidal ideas. The patient is not nervous/anxious and does not have insomnia.      Objective  Vitals:   09/20/16 0830  BP: 120/70  Pulse: 64  Weight: 220 lb (99.8 kg)  Height: 5\' 8"  (1.727 m)    Physical Exam  Constitutional: She is well-developed, well-nourished, and in no distress. She is not irritable. No distress.  HENT:  Head: Normocephalic and atraumatic.  Right Ear: Tympanic membrane, external ear and ear canal normal.  Left Ear: Tympanic membrane, external ear and ear canal normal.  Nose: Nose normal.  Mouth/Throat: Oropharynx is clear and moist.  Eyes: Conjunctivae and EOM are normal. Pupils are equal, round, and reactive to light. Right eye exhibits  no discharge. Left eye exhibits no discharge.  Neck: Normal range of motion. Neck supple. No JVD present. No thyromegaly present.  Cardiovascular: Normal rate, regular rhythm, normal heart sounds and intact distal pulses.  Exam reveals no gallop and no friction rub.   No murmur heard. Pulmonary/Chest: Effort normal and breath sounds normal. She has no wheezes. She has no rales.  Abdominal: Soft. Bowel sounds are normal. She exhibits no mass. There is no tenderness. There is no guarding.  Genitourinary: Rectal exam shows guaiac negative stool.  Musculoskeletal: Normal range of motion. She exhibits no edema.  Lymphadenopathy:    She has no cervical adenopathy.  Neurological: She is  alert. She has normal reflexes.  Skin: Skin is warm and dry. She is not diaphoretic.  Psychiatric: Mood and affect normal.  Nursing note and vitals reviewed.     Assessment & Plan  Problem List Items Addressed This Visit      Cardiovascular and Mediastinum   Essential hypertension   Relevant Medications   lisinopril (PRINIVIL,ZESTRIL) 20 MG tablet   lovastatin (MEVACOR) 20 MG tablet   Other Relevant Orders   Renal Function Panel     Endocrine   Type 2 diabetes mellitus ([facility]) - Primary   Relevant Medications   lisinopril (PRINIVIL,ZESTRIL) 20 MG tablet   lovastatin (MEVACOR) 20 MG tablet   metFORMIN (GLUCOPHAGE) 1000 MG tablet   glipiZIDE (GLUCOTROL) 10 MG tablet   Other Relevant Orders   Renal Function Panel   Hemoglobin A1c     Other   Clinical depression   Relevant Medications   sertraline (ZOLOFT) 100 MG tablet   sertraline (ZOLOFT) 50 MG tablet   HLD (hyperlipidemia)   Relevant Medications   lisinopril (PRINIVIL,ZESTRIL) 20 MG tablet   lovastatin (MEVACOR) 20 MG tablet   Other Relevant Orders   Lipid Profile   Adiposity   Relevant Medications   metFORMIN (GLUCOPHAGE) 1000 MG tablet   glipiZIDE (GLUCOTROL) 10 MG tablet   Chronic pain of toes of both feet      Meds ordered this encounter  Medications  . lisinopril (PRINIVIL,ZESTRIL) 20 MG tablet    Sig: TAKE ONE TABLET BY MOUTH ONCE DAILY. NEEDS TO SCHEDULE APPOINTMENT FOR FURTHER REFILLS    Dispense:  30 tablet    Refill:  6    sched appt  . lovastatin (MEVACOR) 20 MG tablet    Sig: TAKE ONE TABLET BY MOUTH IN THE MORNING---SCHEDULE APPOINTMENT TO BE SEEN    Dispense:  30 tablet    Refill:  6    sched appt  . sertraline (ZOLOFT) 100 MG tablet    Sig: TAKE ONE TABLET BY MOUTH ONCE DAILY. PATIENT TO TAKE A TOTAL OF 150 MG DAILY.    Dispense:  30 tablet    Refill:  6    Please consider 90 day supplies to promote better adherence  . sertraline (ZOLOFT) 50 MG tablet    Sig: Take 1 tablet (50 mg total)  by mouth daily.    Dispense:  30 tablet    Refill:  6    30 days- sched appt  . metFORMIN (GLUCOPHAGE) 1000 MG tablet    Sig: Take 1 tablet (1,000 mg total) by mouth 2 (two) times daily.    Dispense:  60 tablet    Refill:  6  . glipiZIDE (GLUCOTROL) 10 MG tablet    Sig: Take 1 tablet (10 mg total) by mouth 2 (two) times daily.    Dispense:  60 tablet  Refill:  6      Dr. [facility]  09/20/16

## 2016-09-21 LAB — LIPID PANEL
CHOL/HDL RATIO: 4.5 ratio — AB (ref 0.0–4.4)
Cholesterol, Total: 201 mg/dL — ABNORMAL HIGH (ref 100–199)
HDL: 45 mg/dL (ref >39)
LDL CALC: 111 mg/dL — AB (ref 0–99)
TRIGLYCERIDES: 224 mg/dL — AB (ref 0–149)
VLDL Cholesterol Cal: 45 mg/dL — ABNORMAL HIGH (ref 5–40)

## 2016-09-21 LAB — RENAL FUNCTION PANEL
Albumin: 4.3 g/dL (ref 3.5–5.5)
BUN / CREAT RATIO: 31 — AB (ref 9–23)
BUN: 22 mg/dL (ref 6–24)
CO2: 21 mmol/L (ref 20–29)
CREATININE: 0.72 mg/dL (ref 0.57–1.00)
Calcium: 9.6 mg/dL (ref 8.7–10.2)
Chloride: 100 mmol/L (ref 96–106)
GFR calc Af Amer: 111 mL/min/{1.73_m2} (ref >59)
GFR, EST NON AFRICAN AMERICAN: 96 mL/min/{1.73_m2} (ref >59)
GLUCOSE: 173 mg/dL — AB (ref 65–99)
PHOSPHORUS: 4.3 mg/dL (ref 2.5–4.5)
POTASSIUM: 4.4 mmol/L (ref 3.5–5.2)
SODIUM: 139 mmol/L (ref 134–144)

## 2016-09-21 LAB — HEMOGLOBIN A1C
Est. average glucose Bld gHb Est-mCnc: 174 mg/dL
Hgb A1c MFr Bld: 7.7 % — ABNORMAL HIGH (ref 4.8–5.6)

## 2016-11-05 ENCOUNTER — Other Ambulatory Visit: Payer: Self-pay

## 2016-11-05 ENCOUNTER — Telehealth: Payer: Self-pay

## 2016-11-05 MED ORDER — SULFAMETHOXAZOLE-TRIMETHOPRIM 800-160 MG PO TABS: 1.0000 | ORAL_TABLET | Freq: Two times a day (BID) | ORAL | 0 refills | Status: DC

## 2016-11-05 NOTE — Telephone Encounter (Signed)
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 Sent in Septra DS BID x 3 days to [facility] [address]

## 2016-11-28 ENCOUNTER — Ambulatory Visit (INDEPENDENT_AMBULATORY_CARE_PROVIDER_SITE_OTHER): Payer: BC Managed Care – PPO | Admitting: Family Medicine

## 2016-11-28 ENCOUNTER — Encounter: Payer: Self-pay | Admitting: Family Medicine

## 2016-11-28 VITALS — BP 108/70 | HR 75 | Temp 98.2°F | Resp 16 | Ht 68.0 in | Wt 213.2 lb

## 2016-11-28 DIAGNOSIS — E118 Type 2 diabetes mellitus with unspecified complications: Secondary | ICD-10-CM

## 2016-11-28 DIAGNOSIS — K209 Esophagitis, unspecified without bleeding: Secondary | ICD-10-CM

## 2016-11-28 DIAGNOSIS — R7309 Other abnormal glucose: Secondary | ICD-10-CM | POA: Diagnosis not present

## 2016-11-28 DIAGNOSIS — Z23 Encounter for immunization: Secondary | ICD-10-CM | POA: Diagnosis not present

## 2016-11-28 DIAGNOSIS — N3090 Cystitis, unspecified without hematuria: Secondary | ICD-10-CM

## 2016-11-28 LAB — POCT URINALYSIS DIPSTICK
Bilirubin, UA: NEGATIVE
GLUCOSE UA: NEGATIVE
KETONES UA: NEGATIVE
Leukocytes, UA: NEGATIVE
Nitrite, UA: NEGATIVE
Protein, UA: NEGATIVE
RBC UA: NEGATIVE
SPEC GRAV UA: 1.01 (ref 1.010–1.025)
UROBILINOGEN UA: 0.2 U/dL
pH, UA: 5 (ref 5.0–8.0)

## 2016-11-28 LAB — GLUCOSE, POCT (MANUAL RESULT ENTRY): POC Glucose: 98 mg/dl (ref 70–99)

## 2016-11-28 NOTE — Progress Notes (Addendum)
Name: Betty Walters   MRN: [id number]    DOB: [birth year]   Date:11/29/2016       Progress Note  Subjective  Chief Complaint  Chief Complaint  Patient presents with  . Diabetes    Diabetes  She presents for her follow-up diabetic visit. She has type 2 diabetes mellitus. Her disease course has been stable. There are no hypoglycemic associated symptoms. Pertinent negatives for hypoglycemia include no dizziness, headaches or nervousness/anxiousness. There are no diabetic associated symptoms. Pertinent negatives for diabetes include no blurred vision, no chest pain, no polydipsia and no weight loss. There are no hypoglycemic complications. Symptoms are improving. There are no diabetic complications. There are no known risk factors for coronary artery disease. Current diabetic treatment includes oral agent (dual therapy). She is compliant with treatment most of the time. She is following a generally healthy diet. She has not had a previous visit with a dietitian. She participates in exercise daily. Her breakfast blood glucose is taken between 8-9 am. Her breakfast blood glucose range is generally 130-140 mg/dl. An ACE inhibitor/angiotensin II receptor blocker is being taken.  Abdominal Pain  This is a new problem. The current episode started in the past 7 days. The problem occurs daily. The problem has been waxing and waning. The pain is located in the epigastric region. The pain is moderate. The quality of the pain is colicky. The abdominal pain radiates to the back. Pertinent negatives include no constipation, diarrhea, dysuria, fever, frequency, headaches, hematuria, melena, myalgias, nausea or weight loss. She has tried H2 blockers for the symptoms.    No problem-specific Assessment & Plan notes found for this encounter.   Past Medical History:  Diagnosis Date  . Concussion May 2016  . Depression   . Diabetes mellitus without complication ([place])   . High cholesterol   . Hypertension   .  Yeast infection     Past Surgical History:  Procedure Laterality Date  . CESAREAN SECTION    . GALLBLADDER SURGERY    . KNEE SURGERY    . WRIST SURGERY      Family History  Problem Relation Age of Onset  . Cancer Mother   . Diabetes Father     Social History   Social History  . Marital status: Married    Spouse name: N/A  . Number of children: N/A  . Years of education: N/A   Occupational History  . Not on file.   Social History Main Topics  . Smoking status: Never Smoker  . Smokeless tobacco: Never Used  . Alcohol use 0.0 oz/week  . Drug use: No  . Sexual activity: Yes   Other Topics Concern  . Not on file   Social History Narrative  . No narrative on file    Allergies  Allergen Reactions  . Amoxicillin-Pot Clavulanate Nausea And Vomiting    Outpatient Medications Prior to Visit  Medication Sig Dispense Refill  . glipiZIDE (GLUCOTROL) 10 MG tablet Take 1 tablet (10 mg total) by mouth 2 (two) times daily. 60 tablet 6  . lisinopril (PRINIVIL,ZESTRIL) 20 MG tablet TAKE ONE TABLET BY MOUTH ONCE DAILY. NEEDS TO SCHEDULE APPOINTMENT FOR FURTHER REFILLS 30 tablet 6  . lovastatin (MEVACOR) 20 MG tablet TAKE ONE TABLET BY MOUTH IN THE MORNING---SCHEDULE APPOINTMENT TO BE SEEN 30 tablet 6  . metFORMIN (GLUCOPHAGE) 1000 MG tablet Take 1 tablet (1,000 mg total) by mouth 2 (two) times daily. 60 tablet 6  . sertraline (ZOLOFT) 100 MG tablet TAKE  ONE TABLET BY MOUTH ONCE DAILY. PATIENT TO TAKE A TOTAL OF 150 MG DAILY. 30 tablet 6  . sertraline (ZOLOFT) 50 MG tablet Take 1 tablet (50 mg total) by mouth daily. 30 tablet 6  . sulfamethoxazole-trimethoprim (BACTRIM DS,SEPTRA DS) 800-160 MG tablet Take 1 tablet by mouth 2 (two) times daily. 6 tablet 0   No facility-administered medications prior to visit.     Review of Systems  Constitutional: Negative for chills, fever, malaise/fatigue and weight loss.  HENT: Negative for ear discharge, ear pain and sore throat.   Eyes:  Negative for blurred vision.  Respiratory: Negative for cough, sputum production, shortness of breath and wheezing.   Cardiovascular: Negative for chest pain, palpitations and leg swelling.  Gastrointestinal: Positive for abdominal pain. Negative for blood in stool, constipation, diarrhea, heartburn, melena and nausea.  Genitourinary: Negative for dysuria, frequency, hematuria and urgency.  Musculoskeletal: Negative for back pain, joint pain, myalgias and neck pain.  Skin: Negative for rash.  Neurological: Negative for dizziness, tingling, sensory change, focal weakness and headaches.  Endo/Heme/Allergies: Negative for environmental allergies and polydipsia. Does not bruise/bleed easily.  Psychiatric/Behavioral: Negative for depression and suicidal ideas. The patient is not nervous/anxious and does not have insomnia.      Objective  Vitals:   11/28/16 1051  BP: 108/70  Pulse: 75  Resp: 16  Temp: 98.2 F (36.8 C)  TempSrc: Oral  SpO2: 98%  Weight: 213 lb 3.2 oz (96.7 kg)  Height: 5\' 8"  (1.727 m)    Physical Exam  Constitutional: She is well-developed, well-nourished, and in no distress. No distress.  HENT:  Head: Normocephalic and atraumatic.  Right Ear: External ear normal.  Left Ear: External ear normal.  Nose: Nose normal.  Mouth/Throat: Oropharynx is clear and moist.  Eyes: Pupils are equal, round, and reactive to light. Conjunctivae and EOM are normal. Right eye exhibits no discharge. Left eye exhibits no discharge.  Neck: Normal range of motion. Neck supple. No JVD present. No thyromegaly present.  Cardiovascular: Normal rate, regular rhythm, normal heart sounds and intact distal pulses.  Exam reveals no gallop and no friction rub.   No murmur heard. Pulmonary/Chest: Effort normal and breath sounds normal. She has no wheezes. She has no rales. She exhibits no tenderness.  Abdominal: Soft. Bowel sounds are normal. She exhibits no mass. There is no hepatosplenomegaly.  There is no tenderness. There is no rebound and no guarding.  Musculoskeletal: Normal range of motion. She exhibits no edema.  Lymphadenopathy:    She has no cervical adenopathy.  Neurological: She is alert. She has normal reflexes.  Skin: Skin is warm and dry. She is not diaphoretic.  Psychiatric: Mood and affect normal.  Nursing note and vitals reviewed.     Assessment & Plan  Problem List Items Addressed This Visit      Endocrine   Type 2 diabetes mellitus ([place]) - Primary   Relevant Orders   POCT Glucose (CBG) (Completed)   Hemoglobin A1c (Completed)    Other Visit Diagnoses    Esophagitis       Cystitis       follow up   Relevant Orders   POCT urinalysis dipstick (Completed)   Influenza vaccine needed       Relevant Orders   Flu Vaccine QUAD 6+ mos PF IM (Fluarix Quad PF) (Completed)   Elevated hemoglobin A1c       Relevant Orders   Hemoglobin A1c (Completed)      No orders of the  defined types were placed in this encounter.     Dr. [facility]  11/29/16

## 2016-11-29 LAB — HEMOGLOBIN A1C
ESTIMATED AVERAGE GLUCOSE: 163 mg/dL
HEMOGLOBIN A1C: 7.3 % — AB (ref 4.8–5.6)

## 2016-12-04 ENCOUNTER — Ambulatory Visit (INDEPENDENT_AMBULATORY_CARE_PROVIDER_SITE_OTHER): Payer: BC Managed Care – PPO | Admitting: Family Medicine

## 2016-12-04 ENCOUNTER — Encounter: Payer: Self-pay | Admitting: Family Medicine

## 2016-12-04 VITALS — BP 120/78 | HR 64 | Ht 68.0 in | Wt 212.0 lb

## 2016-12-04 DIAGNOSIS — E119 Type 2 diabetes mellitus without complications: Secondary | ICD-10-CM | POA: Diagnosis not present

## 2016-12-04 MED ORDER — LIRAGLUTIDE 18 MG/3ML ~~LOC~~ SOPN: 0.6000 mg | PEN_INJECTOR | Freq: Every day | SUBCUTANEOUS | 4 refills | Status: DC

## 2016-12-04 NOTE — Progress Notes (Signed)
Name: Betty Walters   MRN: [id number]    DOB: 1960/04/15   Date:12/04/2016       Progress Note  Subjective  Chief Complaint  Chief Complaint  Patient presents with  . Diabetes    cannot get A1C down- discuss starting victoza vs Farxiga    Diabetes  She presents for her follow-up diabetic visit. She has type 2 diabetes mellitus. Her disease course has been fluctuating. There are no hypoglycemic associated symptoms. Pertinent negatives for hypoglycemia include no dizziness, headaches or nervousness/anxiousness. Pertinent negatives for diabetes include no blurred vision, no chest pain, no fatigue, no foot paresthesias, no foot ulcerations, no polydipsia, no polyphagia, no polyuria, no visual change, no weakness and no weight loss. There are no hypoglycemic complications. Symptoms are stable. There are no diabetic complications. There are no known risk factors for coronary artery disease.    No problem-specific Assessment & Plan notes found for this encounter.   Past Medical History:  Diagnosis Date  . Concussion May 2016  . Depression   . Diabetes mellitus without complication ([place])   . High cholesterol   . Hypertension   . Yeast infection     Past Surgical History:  Procedure Laterality Date  . CESAREAN SECTION    . GALLBLADDER SURGERY    . KNEE SURGERY    . WRIST SURGERY      Family History  Problem Relation Age of Onset  . Cancer Mother   . Diabetes Father     Social History   Social History  . Marital status: Married    Spouse name: N/A  . Number of children: N/A  . Years of education: N/A   Occupational History  . Not on file.   Social History Main Topics  . Smoking status: Never Smoker  . Smokeless tobacco: Never Used  . Alcohol use 0.0 oz/week  . Drug use: No  . Sexual activity: Yes   Other Topics Concern  . Not on file   Social History Narrative  . No narrative on file    Allergies  Allergen Reactions  . Amoxicillin-Pot Clavulanate Nausea And  Vomiting    Outpatient Medications Prior to Visit  Medication Sig Dispense Refill  . lisinopril (PRINIVIL,ZESTRIL) 20 MG tablet TAKE ONE TABLET BY MOUTH ONCE DAILY. NEEDS TO SCHEDULE APPOINTMENT FOR FURTHER REFILLS 30 tablet 6  . lovastatin (MEVACOR) 20 MG tablet TAKE ONE TABLET BY MOUTH IN THE MORNING---SCHEDULE APPOINTMENT TO BE SEEN 30 tablet 6  . metFORMIN (GLUCOPHAGE) 1000 MG tablet Take 1 tablet (1,000 mg total) by mouth 2 (two) times daily. 60 tablet 6  . sertraline (ZOLOFT) 100 MG tablet TAKE ONE TABLET BY MOUTH ONCE DAILY. PATIENT TO TAKE A TOTAL OF 150 MG DAILY. 30 tablet 6  . sertraline (ZOLOFT) 50 MG tablet Take 1 tablet (50 mg total) by mouth daily. 30 tablet 6  . glipiZIDE (GLUCOTROL) 10 MG tablet Take 1 tablet (10 mg total) by mouth 2 (two) times daily. 60 tablet 6   No facility-administered medications prior to visit.     Review of Systems  Constitutional: Negative for chills, fatigue, fever, malaise/fatigue and weight loss.  HENT: Negative for ear discharge, ear pain and sore throat.   Eyes: Negative for blurred vision.  Respiratory: Negative for cough, sputum production, shortness of breath and wheezing.   Cardiovascular: Negative for chest pain, palpitations and leg swelling.  Gastrointestinal: Negative for abdominal pain, blood in stool, constipation, diarrhea, heartburn, melena and nausea.  Genitourinary: Negative for  dysuria, frequency, hematuria and urgency.  Musculoskeletal: Negative for back pain, joint pain, myalgias and neck pain.  Skin: Negative for rash.  Neurological: Negative for dizziness, tingling, sensory change, focal weakness, weakness and headaches.  Endo/Heme/Allergies: Negative for environmental allergies, polydipsia and polyphagia. Does not bruise/bleed easily.  Psychiatric/Behavioral: Negative for depression and suicidal ideas. The patient is not nervous/anxious and does not have insomnia.      Objective  Vitals:   12/04/16 1110  BP: 120/78   Pulse: 64  Weight: 212 lb (96.2 kg)  Height: 5\' 8"  (1.727 m)    Physical Exam  Constitutional: She is well-developed, well-nourished, and in no distress. No distress.  HENT:  Head: Normocephalic and atraumatic.  Right Ear: External ear normal.  Left Ear: External ear normal.  Nose: Nose normal.  Mouth/Throat: Oropharynx is clear and moist.  Eyes: Pupils are equal, round, and reactive to light. Conjunctivae and EOM are normal. Right eye exhibits no discharge. Left eye exhibits no discharge.  Neck: Normal range of motion. Neck supple. No JVD present. No thyromegaly present.  Cardiovascular: Normal rate, regular rhythm, normal heart sounds and intact distal pulses.  Exam reveals no gallop and no friction rub.   No murmur heard. Pulmonary/Chest: Effort normal and breath sounds normal. She has no wheezes. She has no rales.  Abdominal: Soft. Bowel sounds are normal. She exhibits no mass. There is no tenderness. There is no guarding.  Musculoskeletal: Normal range of motion. She exhibits no edema.  Lymphadenopathy:    She has no cervical adenopathy.  Neurological: She is alert. She has normal reflexes.  Skin: Skin is warm and dry. She is not diaphoretic.  Psychiatric: Mood and affect normal.  Nursing note and vitals reviewed.     Assessment & Plan  Problem List Items Addressed This Visit      Endocrine   Diabetes mellitus, type 2 ([place]) - Primary   Relevant Medications   liraglutide 18 MG/3ML SOPN    gave written Rx for One Touch Ultra monitor, lancets and strips to match. Gave samples of Victoza and needles- will recheck in 3 weeks with readings  Meds ordered this encounter  Medications  . liraglutide 18 MG/3ML SOPN    Sig: Inject 0.1 mLs (0.6 mg total) into the skin daily.    Dispense:  3 mL    Refill:  4   .mmc   Dr. Otilio Miu [facility]  12/04/16

## 2016-12-04 NOTE — Patient Instructions (Signed)

## 2016-12-11 ENCOUNTER — Ambulatory Visit (INDEPENDENT_AMBULATORY_CARE_PROVIDER_SITE_OTHER): Payer: Worker's Compensation | Admitting: Podiatry

## 2016-12-11 ENCOUNTER — Encounter: Payer: Self-pay | Admitting: Podiatry

## 2016-12-11 DIAGNOSIS — M779 Enthesopathy, unspecified: Secondary | ICD-10-CM | POA: Diagnosis not present

## 2016-12-11 DIAGNOSIS — M722 Plantar fascial fibromatosis: Secondary | ICD-10-CM | POA: Diagnosis not present

## 2016-12-11 NOTE — Progress Notes (Signed)
She presents today for follow-up of capsulitis second metatarsophalangeal joint of the left foot. Since this has been bothering me for the past several months but due to insurance and was unable to get an appointment. I been trying to get this in but difficult with Worker's Comp. She states that she still has pain radiating from her left knee distally and the dorsal aspect of the foot and plantar second metatarsophalangeal joint is extremely painful. She also relates redeveloping some heel pain that she had previously overcome. Denies any further trauma. States that her knee is still out of whack.  Objective: Vital signs are stable alert and oriented 3 pulses are palpable. She has pain on palpation of the second metatarsophalangeal joint of the left foot with mild possible hammertoe deformity. Medial deviation of the toe is noted she also has pain on palpation medial calcaneal tubercle of the left heel.  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 Plantar fasciitis left heel.  Plan: Injected these areas today with Kenalog and local anesthetic 10 mg each and also set her up to have a pair of orthotics made to help prevent surgical intervention. Should this fail an MRI will be necessary.

## 2016-12-12 ENCOUNTER — Telehealth: Payer: Self-pay | Admitting: Podiatry

## 2016-12-12 NOTE — Telephone Encounter (Signed)
Valetta Close, pt's case manager called saying she forgot to get note yesterday. Stated pt told her she has no restrictions. Requested the note be faxed to her by the end of the day. Fax number is [phone].

## 2016-12-18 ENCOUNTER — Ambulatory Visit: Payer: Worker's Compensation | Admitting: Orthotics

## 2016-12-18 ENCOUNTER — Encounter: Payer: Self-pay | Admitting: Podiatry

## 2016-12-18 DIAGNOSIS — M722 Plantar fascial fibromatosis: Secondary | ICD-10-CM

## 2016-12-18 DIAGNOSIS — M779 Enthesopathy, unspecified: Secondary | ICD-10-CM

## 2016-12-18 NOTE — Progress Notes (Signed)
Patient cast for f/o to address sub MPJ 2 capuslititis... Patient does not want if Workman's comp will not pay.  Waiting for call. back

## 2016-12-19 ENCOUNTER — Telehealth: Payer: Self-pay | Admitting: Podiatry

## 2016-12-19 NOTE — Telephone Encounter (Signed)
Per Valetta Close (case manager from [place] case management) they got the approval on the patients orthotics. Ok to go ahead and order. She is emailing me documentation.

## 2016-12-25 ENCOUNTER — Ambulatory Visit (INDEPENDENT_AMBULATORY_CARE_PROVIDER_SITE_OTHER): Payer: BC Managed Care – PPO | Admitting: Family Medicine

## 2016-12-25 ENCOUNTER — Encounter: Payer: Self-pay | Admitting: Family Medicine

## 2016-12-25 VITALS — BP 120/64 | HR 60 | Ht 68.0 in | Wt 206.0 lb

## 2016-12-25 DIAGNOSIS — E119 Type 2 diabetes mellitus without complications: Secondary | ICD-10-CM

## 2016-12-25 NOTE — Progress Notes (Signed)
Name: Betty Walters   MRN: [id number]    DOB: 1961/03/05   Date:12/25/2016       Progress Note  Subjective  Chief Complaint  Chief Complaint  Patient presents with  . Diabetes    follow up on starting victoza    Diabetes  She presents for her follow-up diabetic visit. She has type 2 diabetes mellitus. Her disease course has been fluctuating. Pertinent negatives for hypoglycemia include no confusion, dizziness, headaches, hunger, nervousness/anxiousness, pallor, seizures, sleepiness or speech difficulty. Pertinent negatives for diabetes include no blurred vision, no chest pain, no fatigue, no foot paresthesias, no polydipsia, no polyphagia, no polyuria, no visual change, no weakness and no weight loss. Symptoms are stable. There are no known risk factors for coronary artery disease. She is following a generally healthy diet. She rarely participates in exercise. Her breakfast blood glucose is taken between 8-9 am. Her breakfast blood glucose range is generally 140-180 mg/dl. An ACE inhibitor/angiotensin II receptor blocker is being taken.    No problem-specific Assessment & Plan notes found for this encounter.   Past Medical History:  Diagnosis Date  . Concussion May 2016  . Depression   . Diabetes mellitus without complication ([place])   . High cholesterol   . Hypertension   . Yeast infection     Past Surgical History:  Procedure Laterality Date  . CESAREAN SECTION    . GALLBLADDER SURGERY    . KNEE SURGERY    . WRIST SURGERY      Family History  Problem Relation Age of Onset  . Cancer Mother   . Diabetes Father     Social History   Social History  . Marital status: Married    Spouse name: N/A  . Number of children: N/A  . Years of education: N/A   Occupational History  . Not on file.   Social History Main Topics  . Smoking status: Never Smoker  . Smokeless tobacco: Never Used  . Alcohol use 0.0 oz/week  . Drug use: No  . Sexual activity: Yes   Other Topics  Concern  . Not on file   Social History Narrative  . No narrative on file    Allergies  Allergen Reactions  . Amoxicillin-Pot Clavulanate Nausea And Vomiting    Outpatient Medications Prior to Visit  Medication Sig Dispense Refill  . liraglutide 18 MG/3ML SOPN Inject 0.1 mLs (0.6 mg total) into the skin daily. 3 mL 4  . lisinopril (PRINIVIL,ZESTRIL) 20 MG tablet TAKE ONE TABLET BY MOUTH ONCE DAILY. NEEDS TO SCHEDULE APPOINTMENT FOR FURTHER REFILLS 30 tablet 6  . lovastatin (MEVACOR) 20 MG tablet TAKE ONE TABLET BY MOUTH IN THE MORNING---SCHEDULE APPOINTMENT TO BE SEEN 30 tablet 6  . metFORMIN (GLUCOPHAGE) 1000 MG tablet Take 1 tablet (1,000 mg total) by mouth 2 (two) times daily. 60 tablet 6  . sertraline (ZOLOFT) 100 MG tablet TAKE ONE TABLET BY MOUTH ONCE DAILY. PATIENT TO TAKE A TOTAL OF 150 MG DAILY. 30 tablet 6  . sertraline (ZOLOFT) 50 MG tablet Take 1 tablet (50 mg total) by mouth daily. 30 tablet 6   No facility-administered medications prior to visit.     Review of Systems  Constitutional: Negative for chills, fatigue, fever, malaise/fatigue and weight loss.  HENT: Negative for ear discharge, ear pain and sore throat.   Eyes: Negative for blurred vision.  Respiratory: Negative for cough, sputum production, shortness of breath and wheezing.   Cardiovascular: Negative for chest pain, palpitations and leg  swelling.  Gastrointestinal: Negative for abdominal pain, blood in stool, constipation, diarrhea, heartburn, melena and nausea.  Genitourinary: Negative for dysuria, frequency, hematuria and urgency.  Musculoskeletal: Negative for back pain, joint pain, myalgias and neck pain.  Skin: Negative for pallor and rash.  Neurological: Negative for dizziness, tingling, sensory change, focal weakness, seizures, speech difficulty, weakness and headaches.  Endo/Heme/Allergies: Negative for environmental allergies, polydipsia and polyphagia. Does not bruise/bleed easily.   Psychiatric/Behavioral: Negative for confusion, depression and suicidal ideas. The patient is not nervous/anxious and does not have insomnia.      Objective  Vitals:   12/25/16 0932  BP: 120/64  Pulse: 60  Weight: 206 lb (93.4 kg)  Height: 5\' 8"  (1.727 m)    Physical Exam  Constitutional: She is well-developed, well-nourished, and in no distress. No distress.  HENT:  Head: Normocephalic and atraumatic.  Right Ear: External ear normal.  Left Ear: External ear normal.  Nose: Nose normal.  Mouth/Throat: Oropharynx is clear and moist.  Eyes: Pupils are equal, round, and reactive to light. Conjunctivae and EOM are normal. Right eye exhibits no discharge. Left eye exhibits no discharge.  Neck: Normal range of motion. Neck supple. No JVD present. No thyromegaly present.  Cardiovascular: Normal rate, regular rhythm, normal heart sounds and intact distal pulses.  Exam reveals no gallop and no friction rub.   No murmur heard. Pulmonary/Chest: Effort normal and breath sounds normal. She has no wheezes. She has no rales.  Abdominal: Soft. Bowel sounds are normal. She exhibits no mass. There is no tenderness. There is no guarding.  Musculoskeletal: Normal range of motion. She exhibits no edema.  Lymphadenopathy:    She has no cervical adenopathy.  Neurological: She is alert. She has normal reflexes.  Skin: Skin is warm and dry. She is not diaphoretic.  Psychiatric: Mood and affect normal.  Nursing note and vitals reviewed.     Assessment & Plan  Problem List Items Addressed This Visit      Endocrine   Diabetes mellitus, type 2 ([place]) - Primary   Relevant Orders   Hemoglobin A1c      No orders of the defined types were placed in this encounter.     Dr. [facility]  12/25/16

## 2016-12-26 LAB — HEMOGLOBIN A1C
ESTIMATED AVERAGE GLUCOSE: 157 mg/dL
HEMOGLOBIN A1C: 7.1 % — AB (ref 4.8–5.6)

## 2017-01-15 ENCOUNTER — Encounter: Payer: Self-pay | Admitting: Orthotics

## 2017-01-15 ENCOUNTER — Other Ambulatory Visit: Payer: Self-pay

## 2017-01-15 DIAGNOSIS — R110 Nausea: Secondary | ICD-10-CM

## 2017-01-15 MED ORDER — PROMETHAZINE HCL 25 MG PO TABS: 25.0000 mg | ORAL_TABLET | Freq: Three times a day (TID) | ORAL | 0 refills | Status: DC | PRN

## 2017-01-16 ENCOUNTER — Encounter: Payer: Self-pay | Admitting: Family Medicine

## 2017-01-16 ENCOUNTER — Ambulatory Visit (INDEPENDENT_AMBULATORY_CARE_PROVIDER_SITE_OTHER): Payer: BC Managed Care – PPO | Admitting: Family Medicine

## 2017-01-16 VITALS — BP 100/70 | HR 64 | Ht 68.0 in | Wt 202.0 lb

## 2017-01-16 DIAGNOSIS — R1013 Epigastric pain: Secondary | ICD-10-CM

## 2017-01-16 DIAGNOSIS — R110 Nausea: Secondary | ICD-10-CM | POA: Diagnosis not present

## 2017-01-16 DIAGNOSIS — Z1159 Encounter for screening for other viral diseases: Secondary | ICD-10-CM | POA: Diagnosis not present

## 2017-01-16 MED ORDER — PANTOPRAZOLE SODIUM 40 MG PO TBEC: 40.0000 mg | DELAYED_RELEASE_TABLET | Freq: Every day | ORAL | 3 refills | Status: DC

## 2017-01-16 NOTE — Progress Notes (Signed)
Name: Betty Walters   MRN: [id number]    DOB: 05-11-1960   Date:01/16/2017       Progress Note  Subjective  Chief Complaint  Chief Complaint  Patient presents with  . Nausea    had 2 doses of phenergan yesterday- felt a little better. Had a small BM yesterday afternoon    Abdominal Pain  This is a new problem. The current episode started in the past 7 days (onset Tuesday/ night ). The onset quality is sudden. The problem occurs 2 to 4 times per day. The problem has been waxing and waning. The pain is located in the epigastric region. The pain is at a severity of 5/10. The pain is moderate. The quality of the pain is a sensation of fullness ("something sitting there"). The abdominal pain radiates to the back. Associated symptoms include diarrhea, nausea and vomiting. Pertinent negatives include no anorexia, arthralgias, belching, constipation, dysuria, fever, flatus, frequency, headaches, hematochezia, hematuria, melena, myalgias or weight loss. The pain is aggravated by eating. The pain is relieved by being still. She has tried antacids and H2 blockers (phenergan) for the symptoms. The treatment provided mild relief. Her past medical history is significant for gallstones.    No problem-specific Assessment & Plan notes found for this encounter.   Past Medical History:  Diagnosis Date  . Concussion May 2016  . Depression   . Diabetes mellitus without complication ([place])   . High cholesterol   . Hypertension   . Yeast infection     Past Surgical History:  Procedure Laterality Date  . CESAREAN SECTION    . GALLBLADDER SURGERY    . KNEE SURGERY    . WRIST SURGERY      Family History  Problem Relation Age of Onset  . Cancer Mother   . Diabetes Father     Social History   Social History  . Marital status: Married    Spouse name: N/A  . Number of children: N/A  . Years of education: N/A   Occupational History  . Not on file.   Social History Main Topics  . Smoking  status: Never Smoker  . Smokeless tobacco: Never Used  . Alcohol use 0.0 oz/week  . Drug use: No  . Sexual activity: Yes   Other Topics Concern  . Not on file   Social History Narrative  . No narrative on file    Allergies  Allergen Reactions  . Amoxicillin-Pot Clavulanate Nausea And Vomiting    Outpatient Medications Prior to Visit  Medication Sig Dispense Refill  . liraglutide 18 MG/3ML SOPN Inject 0.1 mLs (0.6 mg total) into the skin daily. 3 mL 4  . lisinopril (PRINIVIL,ZESTRIL) 20 MG tablet TAKE ONE TABLET BY MOUTH ONCE DAILY. NEEDS TO SCHEDULE APPOINTMENT FOR FURTHER REFILLS 30 tablet 6  . lovastatin (MEVACOR) 20 MG tablet TAKE ONE TABLET BY MOUTH IN THE MORNING---SCHEDULE APPOINTMENT TO BE SEEN 30 tablet 6  . metFORMIN (GLUCOPHAGE) 1000 MG tablet Take 1 tablet (1,000 mg total) by mouth 2 (two) times daily. 60 tablet 6  . promethazine (PHENERGAN) 25 MG tablet Take 1 tablet (25 mg total) by mouth every 8 (eight) hours as needed for nausea or vomiting. 20 tablet 0  . sertraline (ZOLOFT) 100 MG tablet TAKE ONE TABLET BY MOUTH ONCE DAILY. PATIENT TO TAKE A TOTAL OF 150 MG DAILY. 30 tablet 6  . sertraline (ZOLOFT) 50 MG tablet Take 1 tablet (50 mg total) by mouth daily. 30 tablet 6  No facility-administered medications prior to visit.     Review of Systems  Constitutional: Negative for fever and weight loss.  Gastrointestinal: Positive for abdominal pain, diarrhea, nausea and vomiting. Negative for anorexia, constipation, flatus, hematochezia and melena.  Genitourinary: Negative for dysuria, frequency and hematuria.  Musculoskeletal: Negative for arthralgias and myalgias.  Neurological: Negative for headaches.     Objective  Vitals:   01/16/17 1036  BP: 100/70  Pulse: 64  Weight: 202 lb (91.6 kg)  Height: 5\' 8"  (1.727 m)    Physical Exam  Constitutional: She is well-developed, well-nourished, and in no distress. No distress.  HENT:  Head: Normocephalic and  atraumatic.  Right Ear: Tympanic membrane and external ear normal.  Left Ear: Tympanic membrane and external ear normal.  Nose: Right sinus exhibits maxillary sinus tenderness. Left sinus exhibits maxillary sinus tenderness.  Mouth/Throat: Oropharynx is clear and moist.  Eyes: Pupils are equal, round, and reactive to light. Conjunctivae and EOM are normal. Right eye exhibits no discharge. Left eye exhibits no discharge.  Neck: Normal range of motion. Neck supple. No JVD present. No thyromegaly present.  Cardiovascular: Normal rate, regular rhythm, normal heart sounds and intact distal pulses.  Exam reveals no gallop and no friction rub.   No murmur heard. Pulmonary/Chest: Effort normal and breath sounds normal.  Abdominal: Soft. Bowel sounds are normal. She exhibits no mass. There is no tenderness. There is no guarding.  Musculoskeletal: Normal range of motion. She exhibits no edema.  Lymphadenopathy:    She has no cervical adenopathy.  Neurological: She is alert. She has normal reflexes.  Skin: Skin is warm and dry. She is not diaphoretic.  Psychiatric: Mood and affect normal.  Nursing note and vitals reviewed.     Assessment & Plan  Problem List Items Addressed This Visit    None    Visit Diagnoses    Epigastric pain    -  Primary   Relevant Medications   pantoprazole (PROTONIX) 40 MG tablet   Other Relevant Orders   Hepatic Function Panel (6)   Lipase   Hepatitis C antibody   H. pylori antibody, IgG   CBC   Need for hepatitis C screening test       Nausea       Relevant Orders   Lipase      Meds ordered this encounter  Medications  . pantoprazole (PROTONIX) 40 MG tablet    Sig: Take 1 tablet (40 mg total) by mouth daily.    Dispense:  30 tablet    Refill:  3      Dr. Mac[facility]  01/16/17

## 2017-01-17 ENCOUNTER — Other Ambulatory Visit: Payer: Self-pay

## 2017-01-17 LAB — CBC
HEMOGLOBIN: 12.5 g/dL (ref 11.1–15.9)
Hematocrit: 38.3 % (ref 34.0–46.6)
MCH: 28.4 pg (ref 26.6–33.0)
MCHC: 32.6 g/dL (ref 31.5–35.7)
MCV: 87 fL (ref 79–97)
Platelets: 216 10*3/uL (ref 150–379)
RBC: 4.4 x10E6/uL (ref 3.77–5.28)
RDW: 14.9 % (ref 12.3–15.4)
WBC: 5.1 10*3/uL (ref 3.4–10.8)

## 2017-01-17 LAB — H. PYLORI ANTIBODY, IGG

## 2017-01-17 LAB — HEPATIC FUNCTION PANEL (6)
ALT: 19 IU/L (ref 0–32)
AST: 13 IU/L (ref 0–40)
Albumin: 4.5 g/dL (ref 3.5–5.5)
Alkaline Phosphatase: 91 IU/L (ref 39–117)
BILIRUBIN TOTAL: 0.3 mg/dL (ref 0.0–1.2)
BILIRUBIN, DIRECT: 0.12 mg/dL (ref 0.00–0.40)

## 2017-01-17 LAB — LIPASE: Lipase: 29 U/L (ref 14–72)

## 2017-01-17 LAB — HEPATITIS C ANTIBODY

## 2017-01-30 ENCOUNTER — Other Ambulatory Visit: Payer: Self-pay

## 2017-01-30 DIAGNOSIS — E119 Type 2 diabetes mellitus without complications: Secondary | ICD-10-CM

## 2017-01-30 MED ORDER — LIRAGLUTIDE 18 MG/3ML ~~LOC~~ SOPN: 1.8000 mg | PEN_INJECTOR | Freq: Every day | SUBCUTANEOUS | 4 refills | Status: DC

## 2017-02-02 ENCOUNTER — Other Ambulatory Visit: Payer: Self-pay | Admitting: Family Medicine

## 2017-02-05 ENCOUNTER — Other Ambulatory Visit: Payer: Self-pay

## 2017-02-05 ENCOUNTER — Emergency Department
Admission: EM | Admit: 2017-02-05 | Discharge: 2017-02-06 | Disposition: A | Discharge status: Home / Self Care | Payer: BC Managed Care – PPO | Attending: Emergency Medicine | Admitting: Emergency Medicine

## 2017-02-05 ENCOUNTER — Encounter: Payer: Self-pay | Admitting: Orthotics

## 2017-02-05 DIAGNOSIS — N8110 Cystocele, unspecified: Secondary | ICD-10-CM | POA: Insufficient documentation

## 2017-02-05 DIAGNOSIS — R109 Unspecified abdominal pain: Secondary | ICD-10-CM

## 2017-02-05 DIAGNOSIS — I10 Essential (primary) hypertension: Secondary | ICD-10-CM | POA: Insufficient documentation

## 2017-02-05 DIAGNOSIS — R110 Nausea: Secondary | ICD-10-CM | POA: Diagnosis not present

## 2017-02-05 DIAGNOSIS — Z7984 Long term (current) use of oral hypoglycemic drugs: Secondary | ICD-10-CM | POA: Insufficient documentation

## 2017-02-05 DIAGNOSIS — R1011 Right upper quadrant pain: Secondary | ICD-10-CM | POA: Diagnosis present

## 2017-02-05 DIAGNOSIS — E119 Type 2 diabetes mellitus without complications: Secondary | ICD-10-CM | POA: Insufficient documentation

## 2017-02-05 LAB — COMPREHENSIVE METABOLIC PANEL
ALBUMIN: 4.3 g/dL (ref 3.5–5.0)
ALT: 19 U/L (ref 14–54)
AST: 21 U/L (ref 15–41)
Alkaline Phosphatase: 81 U/L (ref 38–126)
Anion gap: 11 (ref 5–15)
BUN: 23 mg/dL — AB (ref 6–20)
CHLORIDE: 101 mmol/L (ref 101–111)
CO2: 23 mmol/L (ref 22–32)
Calcium: 9.7 mg/dL (ref 8.9–10.3)
Creatinine, Ser: 0.98 mg/dL (ref 0.44–1.00)
GFR calc Af Amer: >60 mL/min (ref >60)
GFR calc non Af Amer: >60 mL/min (ref >60)
GLUCOSE: 174 mg/dL — AB (ref 65–99)
POTASSIUM: 3.9 mmol/L (ref 3.5–5.1)
SODIUM: 135 mmol/L (ref 135–145)
Total Bilirubin: 0.6 mg/dL (ref 0.3–1.2)
Total Protein: 7.6 g/dL (ref 6.5–8.1)

## 2017-02-05 LAB — URINALYSIS, COMPLETE (UACMP) WITH MICROSCOPIC
BILIRUBIN URINE: NEGATIVE
GLUCOSE, UA: NEGATIVE mg/dL
Ketones, ur: NEGATIVE mg/dL
LEUKOCYTES UA: NEGATIVE
NITRITE: NEGATIVE
PROTEIN: NEGATIVE mg/dL
Specific Gravity, Urine: 1.015 (ref 1.005–1.030)
pH: 5 (ref 5.0–8.0)

## 2017-02-05 LAB — CBC
HEMATOCRIT: 38.9 % (ref 35.0–47.0)
Hemoglobin: 13.1 g/dL (ref 12.0–16.0)
MCH: 28.9 pg (ref 26.0–34.0)
MCHC: 33.6 g/dL (ref 32.0–36.0)
MCV: 86.1 fL (ref 80.0–100.0)
PLATELETS: 301 10*3/uL (ref 150–440)
RBC: 4.52 MIL/uL (ref 3.80–5.20)
RDW: 14.6 % — AB (ref 11.5–14.5)
WBC: 9.7 10*3/uL (ref 3.6–11.0)

## 2017-02-05 LAB — LIPASE, BLOOD: Lipase: 37 U/L (ref 11–51)

## 2017-02-05 MED ORDER — ONDANSETRON 4 MG PO TBDP
ORAL_TABLET | ORAL | Status: AC
  Administered 2017-02-05: 4 mg
  Filled 2017-02-05: qty 1

## 2017-02-05 MED ORDER — OXYCODONE-ACETAMINOPHEN 5-325 MG PO TABS
ORAL_TABLET | ORAL | Status: AC
Start: 2017-02-05 — End: 2017-02-05
  Administered 2017-02-05: 1 via ORAL
  Filled 2017-02-05: qty 1

## 2017-02-05 MED ORDER — ONDANSETRON HCL 4 MG PO TABS: 4.0000 mg | ORAL_TABLET | Freq: Once | ORAL | Status: DC

## 2017-02-05 MED ORDER — OXYCODONE-ACETAMINOPHEN 5-325 MG PO TABS
1.0000 | ORAL_TABLET | Freq: Once | ORAL | Status: AC
  Administered 2017-02-05: 1 via ORAL

## 2017-02-05 NOTE — ED Provider Notes (Signed)
[facility] Emergency Department Provider Note   ____________________________________________   First MD Initiated Contact with Patient 02/05/17 2346     (approximate)  I have reviewed the triage vital signs and the nursing notes.   HISTORY  Chief Complaint Abdominal Pain and Nausea    HPI Betty Walters is a 56 y.o. female who presents to the ED from home with a chief complaint of abdominal pain and nausea.  Patient is status post cholecystectomy 27 years ago; was evaluated by her PCP 2 weeks ago for right upper quadrant abdominal pain.  Reports lab work within normal limits but no imaging studies.  She is a [employer] manager.  Reports vomiting Monday night, better Tuesday, then Wednesday had onset of right mid lateral abdominal pain lower than the right upper quadrant pain she experienced 2 weeks ago; symptoms associated with nausea.  Took Phenergan as prescribed by her doctor without relief of symptoms.  Denies associated fever, chills, chest pain, shortness of breath, dysuria, diarrhea.  Denies recent travel, trauma or antibiotic use.  Given Zofran and Percocet in triage which improved her symptoms.   Past Medical History:  Diagnosis Date  . Concussion May 2016  . Depression   . Diabetes mellitus without complication ([place])   . High cholesterol   . Hypertension   . Yeast infection     Patient Active Problem List   Diagnosis Date Noted  . Chronic pain of toes of both feet 09/20/2016  . Type 2 diabetes mellitus ([place]) 09/06/2015  . Allergic rhinitis due to pollen 06/21/2015  . Concussion with coma 10/21/2014  . Clinical depression 09/28/2014  . Diabetes mellitus, type 2 ([place]) 09/28/2014  . HLD (hyperlipidemia) 09/28/2014  . Essential hypertension 09/28/2014  . Adiposity 09/28/2014    Past Surgical History:  Procedure Laterality Date  . CESAREAN SECTION    . GALLBLADDER SURGERY    . KNEE SURGERY    . WRIST SURGERY      Prior to Admission  medications   Medication Sig Start Date End Date Taking? Authorizing Provider  liraglutide 18 MG/3ML SOPN Inject 0.3 mLs (1.8 mg total) into the skin daily. 01/30/17  Yes Juline Patch, MD  lisinopril (PRINIVIL,ZESTRIL) 20 MG tablet TAKE ONE TABLET BY MOUTH ONCE DAILY. NEEDS TO SCHEDULE APPOINTMENT FOR FURTHER REFILLS 09/20/16  Yes Juline Patch, MD  lovastatin (MEVACOR) 20 MG tablet TAKE ONE TABLET BY MOUTH IN THE MORNING---SCHEDULE APPOINTMENT TO BE SEEN 09/20/16  Yes Juline Patch, MD  metFORMIN (GLUCOPHAGE) 1000 MG tablet Take 1 tablet (1,000 mg total) by mouth 2 (two) times daily. 09/20/16  Yes Juline Patch, MD  pantoprazole (PROTONIX) 40 MG tablet Take 1 tablet (40 mg total) by mouth daily. 01/16/17  Yes Juline Patch, MD  promethazine (PHENERGAN) 25 MG tablet Take 1 tablet (25 mg total) by mouth every 8 (eight) hours as needed for nausea or vomiting. 01/15/17  Yes Juline Patch, MD  sertraline (ZOLOFT) 100 MG tablet TAKE ONE TABLET BY MOUTH ONCE DAILY. PATIENT TO TAKE A TOTAL OF 150 MG DAILY. 09/20/16  Yes Juline Patch, MD  sertraline (ZOLOFT) 50 MG tablet Take 1 tablet (50 mg total) by mouth daily. 09/20/16  Yes Juline Patch, MD  dicyclomine (BENTYL) 20 MG tablet Take 1 tablet (20 mg total) every 6 (six) hours as needed by mouth. 02/06/17   Paulette Blanch, MD  ondansetron (ZOFRAN ODT) 4 MG disintegrating tablet Take 1 tablet (4 mg total)  every 8 (eight) hours as needed by mouth for nausea or vomiting. 02/06/17   Paulette Blanch, MD    Allergies Amoxicillin-pot clavulanate  Family History  Problem Relation Age of Onset  . Cancer Mother   . Diabetes Father     Social History Social History   Tobacco Use  . Smoking status: Never Smoker  . Smokeless tobacco: Never Used  Substance Use Topics  . Alcohol use: Yes    Alcohol/week: 0.0 oz  . Drug use: No    Review of Systems  Constitutional: No fever/chills. Eyes: No visual changes. ENT: No sore throat. Cardiovascular:  Denies chest pain. Respiratory: Denies shortness of breath. Gastrointestinal: Positive for abdominal pain, nausea and vomiting.  No diarrhea.  No constipation. Genitourinary: Negative for dysuria. Musculoskeletal: Negative for back pain. Skin: Negative for rash. Neurological: Negative for headaches, focal weakness or numbness.   ____________________________________________   PHYSICAL EXAM:  VITAL SIGNS: ED Triage Vitals  Enc Vitals Group     BP 02/05/17 2016 129/75     Pulse Rate 02/05/17 2016 99     Resp 02/05/17 2016 20     Temp 02/05/17 2016 98.1 F (36.7 C)     Temp Source 02/05/17 2016 Oral     SpO2 02/05/17 2016 98 %     Weight 02/05/17 2017 203 lb (92.1 kg)     Height 02/05/17 2017 5\' 8"  (1.727 m)     Head Circumference --      Peak Flow --      Pain Score 02/05/17 2021 5     Pain Loc --      Pain Edu? --      Excl. in [place]? --     Constitutional: Alert and oriented. Well appearing and in no acute distress. Eyes: Conjunctivae are normal. PERRL. EOMI. Head: Atraumatic. Nose: No congestion/rhinnorhea. Mouth/Throat: Mucous membranes are moist.  Oropharynx non-erythematous. Neck: No stridor.   Cardiovascular: Normal rate, regular rhythm. Grossly normal heart sounds.  Good peripheral circulation. Respiratory: Normal respiratory effort.  No retractions. Lungs CTAB. Gastrointestinal: Soft and mildly tender to palpation right mid lateral and lower quadrants without rebound or guarding.  No tenderness at McBurney's point.  No distention. No abdominal bruits. No CVA tenderness. Musculoskeletal: No lower extremity tenderness nor edema.  No joint effusions. Neurologic:  Normal speech and language. No gross focal neurologic deficits are appreciated. No gait instability. Skin:  Skin is warm, dry and intact. No rash noted. Psychiatric: Mood and affect are normal. Speech and behavior are normal.  ____________________________________________   LABS (all labs ordered are listed,  but only abnormal results are displayed)  Labs Reviewed  CBC - Abnormal; Notable for the following components:      Result Value   RDW 14.6 (*)    All other components within normal limits  COMPREHENSIVE METABOLIC PANEL - Abnormal; Notable for the following components:   Glucose, Bld 174 (*)    BUN 23 (*)    All other components within normal limits  URINALYSIS, COMPLETE (UACMP) WITH MICROSCOPIC - Abnormal; Notable for the following components:   Color, Urine YELLOW (*)    APPearance HAZY (*)    Hgb urine dipstick MODERATE (*)    Bacteria, UA RARE (*)    Squamous Epithelial / LPF 0-5 (*)    All other components within normal limits  LIPASE, BLOOD   ____________________________________________  EKG  None ____________________________________________  RADIOLOGY  Ct Abdomen Pelvis W Contrast  Result Date: 02/06/2017 CLINICAL DATA:  Right upper quadrant pain with nausea and vomiting for 2 weeks. EXAM: CT ABDOMEN AND PELVIS WITH CONTRAST TECHNIQUE: Multidetector CT imaging of the abdomen and pelvis was performed using the standard protocol following bolus administration of intravenous contrast. CONTRAST:  117mL ISOVUE-300 IOPAMIDOL (ISOVUE-300) INJECTION 61% COMPARISON:  02/09/2015 FINDINGS: Lower chest: Lung bases are clear. Hepatobiliary: Mild diffuse fatty infiltration of the liver. No focal liver lesions. Surgical absence of the gallbladder. No bile duct dilatation. Pancreas: Unremarkable. No pancreatic ductal dilatation or surrounding inflammatory changes. Spleen: Normal in size without focal abnormality. Adrenals/Urinary Tract: Adrenal glands are unremarkable. Kidneys are normal, without renal calculi, focal lesion, or hydronephrosis. No wall thickening or filling defect in the bladder. Stomach/Bowel: Stomach is within normal limits. Appendix is not identified. No evidence of bowel wall thickening, distention, or inflammatory changes. Vascular/Lymphatic: No significant vascular  findings are present. No enlarged abdominal or pelvic lymph nodes. Reproductive: Uterus and bilateral adnexa are unremarkable. Other: There is laxity of the pelvic floor musculature with a small cystocele present. No free air or free fluid in the abdomen. Musculoskeletal: No acute or significant osseous findings. IMPRESSION: 1. Mild fatty infiltration of the liver.  No bile duct dilatation. 2. Laxity of the pelvic floor musculature with small cystocele. 3. No bowel obstruction or inflammation. Electronically Signed   By: Lucienne Capers M.D.   On: 02/06/2017 02:15    ____________________________________________   PROCEDURES  Procedure(s) performed: None  Procedures  Critical Care performed: No  ____________________________________________   INITIAL IMPRESSION / ASSESSMENT AND PLAN / ED COURSE  As part of my medical decision making, I reviewed the following data within the [facility] History obtained from family, Nursing notes reviewed and incorporated, Labs reviewed, Radiograph reviewed and Notes from prior ED visits.   56 year old female status post cholecystectomy who presents with right-sided abdominal pain, nausea and vomiting. Differential diagnosis includes, but is not limited to, ovarian cyst, ovarian torsion, acute appendicitis, diverticulitis, urinary tract infection/pyelonephritis, endometriosis, bowel obstruction, colitis, renal colic, gastroenteritis, hernia, fibroids, endometriosis, etc.  Laboratory and urinalysis results unremarkable.  Will proceed with CT abdomen/pelvis to evaluate etiology of patient's abdominal pain.   Clinical Course as of Feb 07 804  Thu Feb 06, 2017  0310 Resting comfortably no acute distress.  Reexamined abdomen which is benign.  Patient is afebrile with normal WBC with symptoms x several days; likelihood of appendicitis unlikely.  Updated patient and spouse of CT imaging results.  Will refer to GI and GYN for outpatient  follow-up.  Will discharge home with prescriptions for Bentyl and Zofran to use as needed.  Strict return precautions given.  Both verbalize understanding and agree with plan of care.  [JS]    Clinical Course User Index [JS] Paulette Blanch, MD     ____________________________________________   FINAL CLINICAL IMPRESSION(S) / ED DIAGNOSES  Final diagnoses:  Female cystocele  Abdominal pain, unspecified abdominal location  Nausea     ED Discharge Orders        Ordered    dicyclomine (BENTYL) 20 MG tablet  Every 6 hours PRN     02/06/17 0316    ondansetron (ZOFRAN ODT) 4 MG disintegrating tablet  Every 8 hours PRN     02/06/17 0316       Note:  This document was prepared using Dragon voice recognition software and may include unintentional dictation errors.    Paulette Blanch, MD 02/06/17 [phone]

## 2017-02-05 NOTE — ED Notes (Signed)
Pt to triage for reassessment; st pain 7/10 to right lower abd radiating into back accomp by nausea; denies any hx of same; meds admin

## 2017-02-05 NOTE — ED Triage Notes (Signed)
Pt reports to ED via POV with c/o RUQ pain, and N/V x2 weeks. Pt reports being seen by PCP "a week ago" and was told all tests were normal. Pt was given r/x for Phenergan and has taken it w/o relief from nausea or vomiting. Pt reports RUQ pain that "comes and goes" in intensity. Pt reports Gall Bladder was removed "27 yrs ago". Pt is A&O, in NAD; RR even, regular, and unlabored; skin color/temp is WNL.

## 2017-02-06 ENCOUNTER — Emergency Department: Payer: BC Managed Care – PPO

## 2017-02-06 ENCOUNTER — Ambulatory Visit: Payer: BC Managed Care – PPO | Admitting: Family Medicine

## 2017-02-06 MED ORDER — GI COCKTAIL ~~LOC~~
30.0000 mL | Freq: Once | ORAL | Status: AC
  Administered 2017-02-06: 30 mL via ORAL

## 2017-02-06 MED ORDER — GI COCKTAIL ~~LOC~~
ORAL | Status: AC
  Administered 2017-02-06: 30 mL via ORAL
  Filled 2017-02-06: qty 30

## 2017-02-06 MED ORDER — DICYCLOMINE HCL 20 MG PO TABS: 20.0000 mg | ORAL_TABLET | Freq: Four times a day (QID) | ORAL | 0 refills | Status: DC | PRN

## 2017-02-06 MED ORDER — ONDANSETRON HCL 4 MG/2ML IJ SOLN
4.0000 mg | Freq: Once | INTRAMUSCULAR | Status: AC
  Administered 2017-02-06: 4 mg via INTRAVENOUS

## 2017-02-06 MED ORDER — ONDANSETRON 4 MG PO TBDP: 4.0000 mg | ORAL_TABLET | Freq: Three times a day (TID) | ORAL | 0 refills | Status: DC | PRN

## 2017-02-06 MED ORDER — IOPAMIDOL (ISOVUE-300) INJECTION 61%
100.0000 mL | Freq: Once | INTRAVENOUS | Status: AC | PRN
  Administered 2017-02-06: 100 mL via INTRAVENOUS

## 2017-02-06 MED ORDER — IOPAMIDOL (ISOVUE-300) INJECTION 61%
30.0000 mL | Freq: Once | INTRAVENOUS | Status: AC
  Administered 2017-02-06: 30 mL via ORAL

## 2017-02-06 MED ORDER — ONDANSETRON HCL 4 MG/2ML IJ SOLN
INTRAMUSCULAR | Status: AC
  Filled 2017-02-06: qty 2

## 2017-02-06 MED ORDER — SODIUM CHLORIDE 0.9 % IV BOLUS (SEPSIS)
500.0000 mL | Freq: Once | INTRAVENOUS | Status: AC
  Administered 2017-02-06: 500 mL via INTRAVENOUS

## 2017-02-06 NOTE — Discharge Instructions (Signed)
1.  You may take medicines as needed for abdominal discomfort and nausea (Bentyl/Zofran #20). 2.  Return to the ER for worsening symptoms, persistent vomiting, fever, difficulty breathing or other concerns.

## 2017-02-10 ENCOUNTER — Ambulatory Visit: Payer: BC Managed Care – PPO | Admitting: Family Medicine

## 2017-02-10 ENCOUNTER — Encounter: Payer: Self-pay | Admitting: Family Medicine

## 2017-02-10 VITALS — BP 120/80 | HR 68 | Ht 68.0 in | Wt 202.0 lb

## 2017-02-10 DIAGNOSIS — R194 Change in bowel habit: Secondary | ICD-10-CM

## 2017-02-10 DIAGNOSIS — Z1211 Encounter for screening for malignant neoplasm of colon: Secondary | ICD-10-CM | POA: Diagnosis not present

## 2017-02-10 DIAGNOSIS — R1013 Epigastric pain: Secondary | ICD-10-CM | POA: Diagnosis not present

## 2017-02-10 DIAGNOSIS — E119 Type 2 diabetes mellitus without complications: Secondary | ICD-10-CM

## 2017-02-10 MED ORDER — METFORMIN HCL 1000 MG PO TABS: 1000.0000 mg | ORAL_TABLET | Freq: Two times a day (BID) | ORAL | 6 refills | Status: DC

## 2017-02-10 MED ORDER — PANTOPRAZOLE SODIUM 40 MG PO TBEC: 40.0000 mg | DELAYED_RELEASE_TABLET | Freq: Every day | ORAL | 3 refills | Status: DC

## 2017-02-10 MED ORDER — LIRAGLUTIDE 18 MG/3ML ~~LOC~~ SOPN: 1.8000 mg | PEN_INJECTOR | Freq: Every day | SUBCUTANEOUS | 4 refills | Status: DC

## 2017-02-10 MED ORDER — DICYCLOMINE HCL 20 MG PO TABS: 20.0000 mg | ORAL_TABLET | Freq: Four times a day (QID) | ORAL | 0 refills | Status: DC | PRN

## 2017-02-10 NOTE — Progress Notes (Signed)
Name: Betty Walters   MRN: [id number]    DOB: 01/04/1961   Date:02/10/2017       Progress Note  Subjective  Chief Complaint  Chief Complaint  Patient presents with  . Diabetes    increased to 1.8 on victoza- avg 170 on that  . Follow-up    needs referral to Dr Allen Norris for colonscopy/ endoscopy due to abdominal issues and change in bowel habits    Diabetes  She presents for her follow-up diabetic visit. She has type 2 diabetes mellitus. Her disease course has been fluctuating. There are no hypoglycemic associated symptoms. Pertinent negatives for hypoglycemia include no dizziness, headaches or nervousness/anxiousness. There are no diabetic associated symptoms. Pertinent negatives for diabetes include no blurred vision, no chest pain, no polydipsia and no weight loss. There are no hypoglycemic complications. There are no diabetic complications. There are no known risk factors for coronary artery disease. Current diabetic treatment includes oral agent (monotherapy) (victoza). She is compliant with treatment most of the time. Her weight is fluctuating minimally. She is following a generally healthy diet. When asked about meal planning, she reported none. She has not had a previous visit with a dietitian. She participates in exercise intermittently. Her breakfast blood glucose is taken between 8-9 am. Her breakfast blood glucose range is generally 140-180 mg/dl. An ACE inhibitor/angiotensin II receptor blocker is being taken. Eye exam is not current.  Abdominal Pain  This is a recurrent problem. The current episode started 1 to 4 weeks ago. The onset quality is gradual. The problem occurs intermittently. The problem has been gradually improving. The pain is located in the RUQ. The pain is moderate. The quality of the pain is colicky. Associated symptoms include constipation, nausea and vomiting. Pertinent negatives include no diarrhea, dysuria, fever, frequency, headaches, hematochezia, hematuria, melena,  myalgias or weight loss. Nothing aggravates the pain. She has tried proton pump inhibitors (bentyl prerscribe) for the symptoms. The treatment provided mild relief. Prior diagnostic workup includes CT scan. Her past medical history is significant for GERD.    No problem-specific Assessment & Plan notes found for this encounter.   Past Medical History:  Diagnosis Date  . Concussion May 2016  . Depression   . Diabetes mellitus without complication ([place])   . High cholesterol   . Hypertension   . Yeast infection     Past Surgical History:  Procedure Laterality Date  . CESAREAN SECTION    . GALLBLADDER SURGERY    . KNEE SURGERY    . WRIST SURGERY      Family History  Problem Relation Age of Onset  . Cancer Mother   . Diabetes Father     Social History   Socioeconomic History  . Marital status: Married    Spouse name: Not on file  . Number of children: Not on file  . Years of education: Not on file  . Highest education level: Not on file  Social Needs  . Financial resource strain: Not on file  . Food insecurity - worry: Not on file  . Food insecurity - inability: Not on file  . Transportation needs - medical: Not on file  . Transportation needs - non-medical: Not on file  Occupational History  . Not on file  Tobacco Use  . Smoking status: Never Smoker  . Smokeless tobacco: Never Used  Substance and Sexual Activity  . Alcohol use: Yes    Alcohol/week: 0.0 oz  . Drug use: No  . Sexual activity: Yes  Other Topics Concern  . Not on file  Social History Narrative  . Not on file    Allergies  Allergen Reactions  . Amoxicillin-Pot Clavulanate Nausea And Vomiting    Outpatient Medications Prior to Visit  Medication Sig Dispense Refill  . lisinopril (PRINIVIL,ZESTRIL) 20 MG tablet TAKE ONE TABLET BY MOUTH ONCE DAILY. NEEDS TO SCHEDULE APPOINTMENT FOR FURTHER REFILLS 30 tablet 6  . lovastatin (MEVACOR) 20 MG tablet TAKE ONE TABLET BY MOUTH IN THE MORNING---SCHEDULE  APPOINTMENT TO BE SEEN 30 tablet 6  . promethazine (PHENERGAN) 25 MG tablet Take 1 tablet (25 mg total) by mouth every 8 (eight) hours as needed for nausea or vomiting. 20 tablet 0  . sertraline (ZOLOFT) 100 MG tablet TAKE ONE TABLET BY MOUTH ONCE DAILY. PATIENT TO TAKE A TOTAL OF 150 MG DAILY. 30 tablet 6  . sertraline (ZOLOFT) 50 MG tablet Take 1 tablet (50 mg total) by mouth daily. 30 tablet 6  . dicyclomine (BENTYL) 20 MG tablet Take 1 tablet (20 mg total) every 6 (six) hours as needed by mouth. 20 tablet 0  . liraglutide 18 MG/3ML SOPN Inject 0.3 mLs (1.8 mg total) into the skin daily. 3 mL 4  . metFORMIN (GLUCOPHAGE) 1000 MG tablet Take 1 tablet (1,000 mg total) by mouth 2 (two) times daily. 60 tablet 6  . pantoprazole (PROTONIX) 40 MG tablet Take 1 tablet (40 mg total) by mouth daily. 30 tablet 3  . ondansetron (ZOFRAN ODT) 4 MG disintegrating tablet Take 1 tablet (4 mg total) every 8 (eight) hours as needed by mouth for nausea or vomiting. 20 tablet 0   No facility-administered medications prior to visit.     Review of Systems  Constitutional: Negative for chills, fever, malaise/fatigue and weight loss.  HENT: Negative for ear discharge, ear pain and sore throat.   Eyes: Negative for blurred vision.  Respiratory: Negative for cough, sputum production, shortness of breath and wheezing.   Cardiovascular: Negative for chest pain, palpitations and leg swelling.  Gastrointestinal: Positive for abdominal pain, constipation, nausea and vomiting. Negative for blood in stool, diarrhea, heartburn, hematochezia and melena.  Genitourinary: Negative for dysuria, frequency, hematuria and urgency.  Musculoskeletal: Negative for back pain, joint pain, myalgias and neck pain.  Skin: Negative for rash.  Neurological: Negative for dizziness, tingling, sensory change, focal weakness and headaches.  Endo/Heme/Allergies: Negative for environmental allergies and polydipsia. Does not bruise/bleed easily.   Psychiatric/Behavioral: Negative for depression and suicidal ideas. The patient is not nervous/anxious and does not have insomnia.      Objective  Vitals:   02/10/17 0904  BP: 120/80  Pulse: 68  Weight: 202 lb (91.6 kg)  Height: 5\' 8"  (1.727 m)    Physical Exam  Constitutional: She is well-developed, well-nourished, and in no distress. No distress.  HENT:  Head: Normocephalic and atraumatic.  Right Ear: External ear normal.  Left Ear: External ear normal.  Nose: Nose normal.  Mouth/Throat: Oropharynx is clear and moist.  Eyes: Conjunctivae and EOM are normal. Pupils are equal, round, and reactive to light. Right eye exhibits no discharge. Left eye exhibits no discharge.  Neck: Normal range of motion. Neck supple. No JVD present. No thyromegaly present.  Cardiovascular: Normal rate, regular rhythm, normal heart sounds and intact distal pulses. Exam reveals no gallop and no friction rub.  No murmur heard. Pulmonary/Chest: Effort normal and breath sounds normal. She has no wheezes. She has no rales.  Abdominal: Soft. Bowel sounds are normal. She exhibits no mass.  There is no hepatosplenomegaly. There is no tenderness. There is no guarding and no CVA tenderness.  Musculoskeletal: Normal range of motion. She exhibits no edema.  Lymphadenopathy:    She has no cervical adenopathy.  Neurological: She is alert. She has normal reflexes.  Skin: Skin is warm and dry. She is not diaphoretic.  Psychiatric: Mood and affect normal.  Nursing note and vitals reviewed.     Assessment & Plan  Problem List Items Addressed This Visit      Endocrine   Diabetes mellitus, type 2 ([place]) - Primary   Relevant Medications   metFORMIN (GLUCOPHAGE) 1000 MG tablet   liraglutide 18 MG/3ML SOPN   Other Relevant Orders   Hemoglobin A1c    Other Visit Diagnoses    Epigastric pain       Relevant Medications   pantoprazole (PROTONIX) 40 MG tablet   dicyclomine (BENTYL) 20 MG tablet   Other  Relevant Orders   Ambulatory referral to Gastroenterology   Change in bowel habit       Relevant Orders   Ambulatory referral to Gastroenterology   Colon cancer screening       Relevant Orders   Ambulatory referral to Gastroenterology      Meds ordered this encounter  Medications  . pantoprazole (PROTONIX) 40 MG tablet    Sig: Take 1 tablet (40 mg total) daily by mouth.    Dispense:  30 tablet    Refill:  3  . metFORMIN (GLUCOPHAGE) 1000 MG tablet    Sig: Take 1 tablet (1,000 mg total) 2 (two) times daily by mouth.    Dispense:  60 tablet    Refill:  6  . liraglutide 18 MG/3ML SOPN    Sig: Inject 0.3 mLs (1.8 mg total) daily into the skin.    Dispense:  3 mL    Refill:  4  . dicyclomine (BENTYL) 20 MG tablet    Sig: Take 1 tablet (20 mg total) every 6 (six) hours as needed by mouth.    Dispense:  20 tablet    Refill:  0      Dr. Otilio Miu [facility]  02/10/17

## 2017-02-11 LAB — HEMOGLOBIN A1C
ESTIMATED AVERAGE GLUCOSE: 154 mg/dL
Hgb A1c MFr Bld: 7 % — ABNORMAL HIGH (ref 4.8–5.6)

## 2017-02-12 ENCOUNTER — Other Ambulatory Visit: Payer: Self-pay

## 2017-02-12 DIAGNOSIS — E119 Type 2 diabetes mellitus without complications: Secondary | ICD-10-CM

## 2017-02-12 MED ORDER — PIOGLITAZONE HCL 15 MG PO TABS: 15.0000 mg | ORAL_TABLET | Freq: Every day | ORAL | 1 refills | Status: DC

## 2017-02-12 MED ORDER — LIRAGLUTIDE 18 MG/3ML ~~LOC~~ SOPN: 1.8000 mg | PEN_INJECTOR | Freq: Every day | SUBCUTANEOUS | 4 refills | Status: DC

## 2017-02-25 ENCOUNTER — Telehealth: Payer: Self-pay | Admitting: Family Medicine

## 2017-02-25 ENCOUNTER — Other Ambulatory Visit: Payer: Self-pay

## 2017-02-25 DIAGNOSIS — R1013 Epigastric pain: Secondary | ICD-10-CM

## 2017-02-25 MED ORDER — DICYCLOMINE HCL 20 MG PO TABS: 20.0000 mg | ORAL_TABLET | Freq: Four times a day (QID) | ORAL | 0 refills | Status: DC | PRN

## 2017-02-25 NOTE — Telephone Encounter (Signed)
Patient called for refill on dicyclomine (BENTYL) 20 MG tablet -please advise

## 2017-03-12 ENCOUNTER — Encounter: Payer: Self-pay | Admitting: Gastroenterology

## 2017-03-12 ENCOUNTER — Other Ambulatory Visit: Payer: Self-pay

## 2017-03-12 ENCOUNTER — Ambulatory Visit (INDEPENDENT_AMBULATORY_CARE_PROVIDER_SITE_OTHER): Payer: BC Managed Care – PPO | Admitting: Gastroenterology

## 2017-03-12 VITALS — BP 128/67 | HR 77 | Ht 68.0 in | Wt 202.0 lb

## 2017-03-12 DIAGNOSIS — R194 Change in bowel habit: Secondary | ICD-10-CM | POA: Diagnosis not present

## 2017-03-12 DIAGNOSIS — R110 Nausea: Secondary | ICD-10-CM | POA: Diagnosis not present

## 2017-03-12 NOTE — Progress Notes (Signed)
Gastroenterology Consultation  Referring Provider:     Juline Patch, MD Primary Care Physician:  Juline Patch, MD Primary Gastroenterologist:  Dr. Allen Norris     Reason for Consultation:     Change in bowel habits and abdominal pain        HPI:   Betty Walters is a 56 y.o. y/o female referred for consultation & management of Change in bowel habits and abdominal pain by Dr. Ronnald Ramp, Iven Finn, MD.  This patient comes in today and reports that she had her gallbladder out 28 years ago and states that she has had some intermittent alternating diarrhea and constipation.  The patient states that she has now recently had a change to more diarrhea and loose bowel movements.  The patient states that she has to run to the bathroom shortly after eating.  She also reports that she has lost some weight since she changed her diabetes medication.  The patient states that she has bloating with abdominal pain. The patient reports that she will have a change in bowel habits with certain foods and then she will then have the same food without any problems.  The patient has Protonix but states she only takes it as needed.  She also takes dicyclomine as needed. The patient reports that her husband thinks that her abdominal pain and change in bowel habits is due to her Victoza but the patient states her symptoms have been present before she change her medications. The patient has been losing weight that she states has been due to her medication. The patient has had episodes of nausea with 1 episode of nausea so bad that she had a leave work and then vomited on the way home while she was going to get her antiemetic medication from home  Past Medical History:  Diagnosis Date  . Concussion May 2016  . Depression   . Diabetes mellitus without complication ([place])   . High cholesterol   . Hypertension   . Yeast infection     Past Surgical History:  Procedure Laterality Date  . CESAREAN SECTION    . GALLBLADDER SURGERY     . KNEE SURGERY    . WRIST SURGERY      Prior to Admission medications   Medication Sig Start Date End Date Taking? Authorizing Provider  dicyclomine (BENTYL) 20 MG tablet Take 1 tablet (20 mg total) by mouth every 6 (six) hours as needed. 02/25/17  Yes Juline Patch, MD  liraglutide 18 MG/3ML SOPN Inject 0.3 mLs (1.8 mg total) daily into the skin. 02/12/17  Yes Juline Patch, MD  lisinopril (PRINIVIL,ZESTRIL) 20 MG tablet TAKE ONE TABLET BY MOUTH ONCE DAILY. NEEDS TO SCHEDULE APPOINTMENT FOR FURTHER REFILLS 09/20/16  Yes Juline Patch, MD  lovastatin (MEVACOR) 20 MG tablet TAKE ONE TABLET BY MOUTH IN THE MORNING---SCHEDULE APPOINTMENT TO BE SEEN 09/20/16  Yes Juline Patch, MD  metFORMIN (GLUCOPHAGE) 1000 MG tablet Take 1 tablet (1,000 mg total) 2 (two) times daily by mouth. 02/10/17  Yes Juline Patch, MD  pantoprazole (PROTONIX) 40 MG tablet Take 1 tablet (40 mg total) daily by mouth. 02/10/17  Yes Juline Patch, MD  pioglitazone (ACTOS) 15 MG tablet Take 1 tablet (15 mg total) daily by mouth. 02/12/17  Yes Juline Patch, MD  promethazine (PHENERGAN) 25 MG tablet Take 1 tablet (25 mg total) by mouth every 8 (eight) hours as needed for nausea or vomiting. 01/15/17  Yes Juline Patch, MD  sertraline (ZOLOFT) 100 MG tablet TAKE ONE TABLET BY MOUTH ONCE DAILY. PATIENT TO TAKE A TOTAL OF 150 MG DAILY. 09/20/16  Yes Juline Patch, MD  sertraline (ZOLOFT) 50 MG tablet Take 1 tablet (50 mg total) by mouth daily. 09/20/16  Yes Juline Patch, MD    Family History  Problem Relation Age of Onset  . Cancer Mother   . Diabetes Father      Social History   Tobacco Use  . Smoking status: Never Smoker  . Smokeless tobacco: Never Used  Substance Use Topics  . Alcohol use: Yes    Alcohol/week: 0.0 oz  . Drug use: No    Allergies as of 03/12/2017 - Review Complete 03/12/2017  Allergen Reaction Noted  . Amoxicillin-pot clavulanate Nausea And Vomiting 01/26/2014    Review of  Systems:    All systems reviewed and negative except where noted in HPI.   Physical Exam:  BP 128/67   Pulse 77   Ht 5\' 8"  (1.727 m)   Wt 202 lb (91.6 kg)   BMI 30.71 kg/m  No LMP recorded. Patient is postmenopausal. Psych:  Alert and cooperative. Normal mood and affect. General:   Alert,  Well-developed, well-nourished, pleasant and cooperative in NAD Head:  Normocephalic and atraumatic. Eyes:  Sclera clear, no icterus.   Conjunctiva pink. Ears:  Normal auditory acuity. Nose:  No deformity, discharge, or lesions. Mouth:  No deformity or lesions,oropharynx pink & moist. Neck:  Supple; no masses or thyromegaly. Lungs:  Respirations even and unlabored.  Clear throughout to auscultation.   No wheezes, crackles, or rhonchi. No acute distress. Heart:  Regular rate and rhythm; no murmurs, clicks, rubs, or gallops. Abdomen:  Normal bowel sounds.  No bruits.  Soft, non-tender and non-distended without masses, hepatosplenomegaly or hernias noted.  No guarding or rebound tenderness.  Negative Carnett sign.   Rectal:  Deferred.  Msk:  Symmetrical without gross deformities.  Good, equal movement & strength bilaterally. Pulses:  Normal pulses noted. Extremities:  No clubbing or edema.  No cyanosis. Neurologic:  Alert and oriented x3;  grossly normal neurologically. Skin:  Intact without significant lesions or rashes.  No jaundice. Lymph Nodes:  No significant cervical adenopathy. Psych:  Alert and cooperative. Normal mood and affect.  Imaging Studies: No results found.  Assessment and Plan:   Betty Walters is a 56 y.o. y/o female Who comes in with a report of bloating abdominal pain with a history of alternating diarrhea and constipation that has been more diarrhea recently.  The patient also has urgency after she eats.  The patient has had bloating and abdominal pain with some weight loss.  The patient will be set up for an EGD and colonoscopy.  The patient has been told to take her  Protonix every day and not intermittently.  She has been told that this can help her episodes of nausea. The patient has been explained the plan and agrees with it.  Lucilla Lame, MD. Mar[facility] Regal   Note: This dictation was prepared with Dragon dictation along with smaller phrase technology. Any transcriptional errors that result from this process are unintentional.

## 2017-03-13 ENCOUNTER — Other Ambulatory Visit: Payer: Self-pay

## 2017-03-13 DIAGNOSIS — R194 Change in bowel habit: Secondary | ICD-10-CM

## 2017-03-19 ENCOUNTER — Telehealth: Payer: Self-pay | Admitting: Gastroenterology

## 2017-03-19 NOTE — Telephone Encounter (Signed)
Please advise 

## 2017-03-19 NOTE — Telephone Encounter (Signed)
Patient left a voice message that she is having "sulfer burps" What can she take?

## 2017-03-20 NOTE — Telephone Encounter (Signed)
Went to the start and we had not changed any of her medications except to tell her that she should take her Protonix every day. This was not something she complained about when she was in the office.

## 2017-03-27 DIAGNOSIS — M722 Plantar fascial fibromatosis: Secondary | ICD-10-CM | POA: Diagnosis not present

## 2017-03-28 ENCOUNTER — Other Ambulatory Visit: Payer: Self-pay

## 2017-03-28 MED ORDER — AMOXICILLIN 500 MG PO CAPS: 500.0000 mg | ORAL_CAPSULE | Freq: Three times a day (TID) | ORAL | 0 refills | Status: DC

## 2017-04-02 ENCOUNTER — Ambulatory Visit: Payer: Worker's Compensation | Admitting: Orthotics

## 2017-04-02 DIAGNOSIS — M779 Enthesopathy, unspecified: Secondary | ICD-10-CM

## 2017-04-02 DIAGNOSIS — M722 Plantar fascial fibromatosis: Secondary | ICD-10-CM

## 2017-04-02 NOTE — Progress Notes (Signed)
Patient came in today to pick up custom made foot orthotics.  The goals were accomplished and the patient reported no dissatisfaction with said orthotics.  Patient was advised of breakin period and how to report any issues. 

## 2017-04-04 ENCOUNTER — Encounter: Payer: Self-pay | Admitting: Family Medicine

## 2017-04-04 ENCOUNTER — Other Ambulatory Visit: Payer: Self-pay

## 2017-04-04 ENCOUNTER — Encounter: Payer: Self-pay | Admitting: *Deleted

## 2017-04-04 ENCOUNTER — Ambulatory Visit: Payer: BC Managed Care – PPO | Admitting: Family Medicine

## 2017-04-04 VITALS — BP 120/60 | HR 84 | Ht 68.0 in | Wt 203.0 lb

## 2017-04-04 DIAGNOSIS — Z23 Encounter for immunization: Secondary | ICD-10-CM | POA: Diagnosis not present

## 2017-04-04 DIAGNOSIS — J0100 Acute maxillary sinusitis, unspecified: Secondary | ICD-10-CM | POA: Diagnosis not present

## 2017-04-04 DIAGNOSIS — E118 Type 2 diabetes mellitus with unspecified complications: Secondary | ICD-10-CM | POA: Diagnosis not present

## 2017-04-04 NOTE — Progress Notes (Signed)
Name: Betty Walters   MRN: [id number]    DOB: [birth year]   Date:04/04/2017       Progress Note  Subjective  Chief Complaint  Chief Complaint  Patient presents with  . Sinusitis    called in antibiotic last week- follow up on sinus infection    Sinusitis  This is a chronic problem. The current episode started 1 to 4 weeks ago. The problem has been gradually improving since onset. There has been no fever. The fever has been present for 3 to 4 days. The pain is mild. Pertinent negatives include no chills, congestion, coughing, diaphoresis, ear pain, headaches, hoarse voice, neck pain, shortness of breath, sinus pressure, sneezing, sore throat or swollen glands. Past treatments include nothing. The treatment provided mild relief.    No problem-specific Assessment & Plan notes found for this encounter.   Past Medical History:  Diagnosis Date  . Concussion May 2016  . Depression   . Diabetes mellitus without complication ([place])   . High cholesterol   . Hypertension   . Yeast infection     Past Surgical History:  Procedure Laterality Date  . CESAREAN SECTION    . GALLBLADDER SURGERY    . KNEE SURGERY    . WRIST SURGERY      Family History  Problem Relation Age of Onset  . Cancer Mother   . Diabetes Father     Social History   Socioeconomic History  . Marital status: Married    Spouse name: Not on file  . Number of children: Not on file  . Years of education: Not on file  . Highest education level: Not on file  Social Needs  . Financial resource strain: Not on file  . Food insecurity - worry: Not on file  . Food insecurity - inability: Not on file  . Transportation needs - medical: Not on file  . Transportation needs - non-medical: Not on file  Occupational History  . Not on file  Tobacco Use  . Smoking status: Never Smoker  . Smokeless tobacco: Never Used  Substance and Sexual Activity  . Alcohol use: Yes    Alcohol/week: 0.0 oz  . Drug use: No  . Sexual  activity: Yes  Other Topics Concern  . Not on file  Social History Narrative  . Not on file    Allergies  Allergen Reactions  . Amoxicillin-Pot Clavulanate Nausea And Vomiting    Outpatient Medications Prior to Visit  Medication Sig Dispense Refill  . amoxicillin (AMOXIL) 500 MG capsule Take 1 capsule (500 mg total) by mouth 3 (three) times daily. 30 capsule 0  . dicyclomine (BENTYL) 20 MG tablet Take 1 tablet (20 mg total) by mouth every 6 (six) hours as needed. 30 tablet 0  . liraglutide 18 MG/3ML SOPN Inject 0.3 mLs (1.8 mg total) daily into the skin. (Patient taking differently: Inject 1.8 mg into the skin daily. Dispense 3 pens at a time) 3 mL 4  . lisinopril (PRINIVIL,ZESTRIL) 20 MG tablet TAKE ONE TABLET BY MOUTH ONCE DAILY. NEEDS TO SCHEDULE APPOINTMENT FOR FURTHER REFILLS 30 tablet 6  . lovastatin (MEVACOR) 20 MG tablet TAKE ONE TABLET BY MOUTH IN THE MORNING---SCHEDULE APPOINTMENT TO BE SEEN 30 tablet 6  . metFORMIN (GLUCOPHAGE) 1000 MG tablet Take 1 tablet (1,000 mg total) 2 (two) times daily by mouth. 60 tablet 6  . pantoprazole (PROTONIX) 40 MG tablet Take 1 tablet (40 mg total) daily by mouth. (Patient taking differently: Take 40 mg by  mouth daily. Dr Allen Norris) 30 tablet 3  . pioglitazone (ACTOS) 15 MG tablet Take 1 tablet (15 mg total) daily by mouth. 30 tablet 1  . promethazine (PHENERGAN) 25 MG tablet Take 1 tablet (25 mg total) by mouth every 8 (eight) hours as needed for nausea or vomiting. 20 tablet 0  . sertraline (ZOLOFT) 100 MG tablet TAKE ONE TABLET BY MOUTH ONCE DAILY. PATIENT TO TAKE A TOTAL OF 150 MG DAILY. 30 tablet 6  . sertraline (ZOLOFT) 50 MG tablet Take 1 tablet (50 mg total) by mouth daily. 30 tablet 6   No facility-administered medications prior to visit.     Review of Systems  Constitutional: Negative for chills, diaphoresis, fever, malaise/fatigue and weight loss.  HENT: Negative for congestion, ear discharge, ear pain, hoarse voice, sinus pressure,  sneezing and sore throat.   Eyes: Negative for blurred vision.  Respiratory: Negative for cough, sputum production, shortness of breath and wheezing.   Cardiovascular: Negative for chest pain, palpitations and leg swelling.  Gastrointestinal: Negative for abdominal pain, blood in stool, constipation, diarrhea, heartburn, melena and nausea.  Genitourinary: Negative for dysuria, frequency, hematuria and urgency.  Musculoskeletal: Negative for back pain, joint pain, myalgias and neck pain.  Skin: Negative for rash.  Neurological: Negative for dizziness, tingling, sensory change, focal weakness and headaches.  Endo/Heme/Allergies: Negative for environmental allergies and polydipsia. Does not bruise/bleed easily.  Psychiatric/Behavioral: Negative for depression and suicidal ideas. The patient is not nervous/anxious and does not have insomnia.      Objective  Vitals:   04/04/17 0908  BP: 120/60  Pulse: 84  Weight: 203 lb (92.1 kg)  Height: 5\' 8"  (1.727 m)    Physical Exam  Constitutional: She is well-developed, well-nourished, and in no distress. No distress.  HENT:  Head: Normocephalic and atraumatic.  Right Ear: External ear normal.  Left Ear: External ear normal.  Nose: Nose normal.  Mouth/Throat: Oropharynx is clear and moist.  Eyes: Conjunctivae and EOM are normal. Pupils are equal, round, and reactive to light. Right eye exhibits no discharge. Left eye exhibits no discharge.  Neck: Normal range of motion. Neck supple. No JVD present. No thyromegaly present.  Cardiovascular: Normal rate, regular rhythm, normal heart sounds and intact distal pulses. Exam reveals no gallop and no friction rub.  No murmur heard. Pulmonary/Chest: Effort normal and breath sounds normal. She has no wheezes. She has no rales.  Abdominal: Soft. Bowel sounds are normal. She exhibits no mass. There is no tenderness. There is no guarding.  Musculoskeletal: Normal range of motion. She exhibits no edema.   Lymphadenopathy:    She has no cervical adenopathy.  Neurological: She is alert.  Skin: Skin is warm and dry. She is not diaphoretic.  Psychiatric: Mood and affect normal.  Nursing note and vitals reviewed.     Assessment & Plan  Problem List Items Addressed This Visit      Endocrine   Type 2 diabetes mellitus with complication ([place])    Other Visit Diagnoses    Acute maxillary sinusitis, recurrence not specified    -  Primary   Need for 23-polyvalent pneumococcal polysaccharide vaccine       Relevant Orders   Pneumococcal polysaccharide vaccine 23-valent greater than or equal to 2yo subcutaneous/IM (Completed)      No orders of the defined types were placed in this encounter.     Dr. [facility]  04/04/17

## 2017-04-07 ENCOUNTER — Other Ambulatory Visit: Payer: Self-pay

## 2017-04-10 NOTE — Discharge Instructions (Signed)
General Anesthesia, Adult, Care After °These instructions provide you with information about caring for yourself after your procedure. Your health care provider may also give you more specific instructions. Your treatment has been planned according to current medical practices, but problems sometimes occur. Call your health care provider if you have any problems or questions after your procedure. °What can I expect after the procedure? °After the procedure, it is common to have: °· Vomiting. °· A sore throat. °· Mental slowness. ° °It is common to feel: °· Nauseous. °· Cold or shivery. °· Sleepy. °· Tired. °· Sore or achy, even in parts of your body where you did not have surgery. ° °Follow these instructions at home: °For at least 24 hours after the procedure: °· Do not: °? Participate in activities where you could fall or become injured. °? Drive. °? Use heavy machinery. °? Drink alcohol. °? Take sleeping pills or medicines that cause drowsiness. °? Make important decisions or sign legal documents. °? Take care of children on your own. °· Rest. °Eating and drinking °· If you vomit, drink water, juice, or soup when you can drink without vomiting. °· Drink enough fluid to keep your urine clear or pale yellow. °· Make sure you have little or no nausea before eating solid foods. °· Follow the diet recommended by your health care provider. °General instructions °· Have a responsible adult stay with you until you are awake and alert. °· Return to your normal activities as told by your health care provider. Ask your health care provider what activities are safe for you. °· Take over-the-counter and prescription medicines only as told by your health care provider. °· If you smoke, do not smoke without supervision. °· Keep all follow-up visits as told by your health care provider. This is important. °Contact a health care provider if: °· You continue to have nausea or vomiting at home, and medicines are not helpful. °· You  cannot drink fluids or start eating again. °· You cannot urinate after 8-12 hours. °· You develop a skin rash. °· You have fever. °· You have increasing redness at the site of your procedure. °Get help right away if: °· You have difficulty breathing. °· You have chest pain. °· You have unexpected bleeding. °· You feel that you are having a life-threatening or urgent problem. °This information is not intended to replace advice given to you by your health care provider. Make sure you discuss any questions you have with your health care provider. °Document Released: 06/24/2000 Document Revised: 08/21/2015 Document Reviewed: 03/02/2015 °Elsevier Interactive Patient Education © 2018 [employer] ° °

## 2017-04-11 ENCOUNTER — Ambulatory Visit: Payer: BC Managed Care – PPO | Admitting: Anesthesiology

## 2017-04-11 ENCOUNTER — Ambulatory Visit
Admission: RE | Admit: 2017-04-11 | Discharge: 2017-04-11 | Disposition: A | Discharge status: Home / Self Care | Payer: BC Managed Care – PPO | Source: Ambulatory Visit | Attending: Gastroenterology | Admitting: Gastroenterology

## 2017-04-11 ENCOUNTER — Encounter
Admission: RE | Disposition: A | Discharge status: Home / Self Care | Payer: Self-pay | Source: Ambulatory Visit | Attending: Gastroenterology

## 2017-04-11 DIAGNOSIS — I10 Essential (primary) hypertension: Secondary | ICD-10-CM | POA: Insufficient documentation

## 2017-04-11 DIAGNOSIS — Z7984 Long term (current) use of oral hypoglycemic drugs: Secondary | ICD-10-CM | POA: Insufficient documentation

## 2017-04-11 DIAGNOSIS — F329 Major depressive disorder, single episode, unspecified: Secondary | ICD-10-CM | POA: Diagnosis not present

## 2017-04-11 DIAGNOSIS — K219 Gastro-esophageal reflux disease without esophagitis: Secondary | ICD-10-CM | POA: Diagnosis not present

## 2017-04-11 DIAGNOSIS — Z79899 Other long term (current) drug therapy: Secondary | ICD-10-CM | POA: Insufficient documentation

## 2017-04-11 DIAGNOSIS — E7800 Pure hypercholesterolemia, unspecified: Secondary | ICD-10-CM | POA: Diagnosis not present

## 2017-04-11 DIAGNOSIS — K640 First degree hemorrhoids: Secondary | ICD-10-CM | POA: Insufficient documentation

## 2017-04-11 DIAGNOSIS — D124 Benign neoplasm of descending colon: Secondary | ICD-10-CM

## 2017-04-11 DIAGNOSIS — R12 Heartburn: Secondary | ICD-10-CM

## 2017-04-11 DIAGNOSIS — R194 Change in bowel habit: Secondary | ICD-10-CM

## 2017-04-11 DIAGNOSIS — R1013 Epigastric pain: Secondary | ICD-10-CM | POA: Diagnosis not present

## 2017-04-11 DIAGNOSIS — K635 Polyp of colon: Secondary | ICD-10-CM | POA: Diagnosis not present

## 2017-04-11 DIAGNOSIS — E119 Type 2 diabetes mellitus without complications: Secondary | ICD-10-CM | POA: Insufficient documentation

## 2017-04-11 DIAGNOSIS — Z860100 Personal history of colon polyps, unspecified: Secondary | ICD-10-CM

## 2017-04-11 HISTORY — PX: POLYPECTOMY: SHX149

## 2017-04-11 HISTORY — DX: Gastro-esophageal reflux disease without esophagitis: K21.9

## 2017-04-11 HISTORY — PX: COLONOSCOPY WITH PROPOFOL: SHX5780

## 2017-04-11 HISTORY — PX: ESOPHAGOGASTRODUODENOSCOPY (EGD) WITH PROPOFOL: SHX5813

## 2017-04-11 HISTORY — DX: Family history of other specified conditions: Z84.89

## 2017-04-11 LAB — GLUCOSE, CAPILLARY
GLUCOSE-CAPILLARY: 131 mg/dL — AB (ref 65–99)
GLUCOSE-CAPILLARY: 120 mg/dL — AB (ref 65–99)

## 2017-04-11 SURGERY — COLONOSCOPY WITH PROPOFOL
Anesthesia: General | Wound class: Contaminated

## 2017-04-11 MED ORDER — PROPOFOL 10 MG/ML IV BOLUS
INTRAVENOUS | Status: DC | PRN
  Administered 2017-04-11 (×2): 30 mg via INTRAVENOUS
  Administered 2017-04-11: 20 mg via INTRAVENOUS
  Administered 2017-04-11: 30 mg via INTRAVENOUS
  Administered 2017-04-11: 20 mg via INTRAVENOUS
  Administered 2017-04-11: 30 mg via INTRAVENOUS
  Administered 2017-04-11: 20 mg via INTRAVENOUS
  Administered 2017-04-11: 150 mg via INTRAVENOUS
  Administered 2017-04-11: 40 mg via INTRAVENOUS
  Administered 2017-04-11: 20 mg via INTRAVENOUS

## 2017-04-11 MED ORDER — SODIUM CHLORIDE 0.9 % IV SOLN: INTRAVENOUS | Status: DC

## 2017-04-11 MED ORDER — LIDOCAINE HCL (CARDIAC) 20 MG/ML IV SOLN
INTRAVENOUS | Status: DC | PRN
  Administered 2017-04-11: 50 mg via INTRAVENOUS

## 2017-04-11 MED ORDER — LACTATED RINGERS IV SOLN
10.0000 mL/h | INTRAVENOUS | Status: DC
  Administered 2017-04-11: 10 mL/h via INTRAVENOUS

## 2017-04-11 MED ORDER — ACETAMINOPHEN 325 MG PO TABS: 650.0000 mg | ORAL_TABLET | Freq: Once | ORAL | Status: DC | PRN

## 2017-04-11 MED ORDER — ONDANSETRON HCL 4 MG/2ML IJ SOLN: 4.0000 mg | Freq: Once | INTRAMUSCULAR | Status: DC | PRN

## 2017-04-11 MED ORDER — GLYCOPYRROLATE 0.2 MG/ML IJ SOLN
INTRAMUSCULAR | Status: DC | PRN
  Administered 2017-04-11: 0.1 mg via INTRAVENOUS

## 2017-04-11 MED ORDER — ACETAMINOPHEN 160 MG/5ML PO SOLN: 325.0000 mg | ORAL | Status: DC | PRN

## 2017-04-11 SURGICAL SUPPLY — 36 items
BALLN DILATOR 10-12 8 (BALLOONS)
BALLN DILATOR 12-15 8 (BALLOONS)
BALLN DILATOR 15-18 8 (BALLOONS)
BALLN DILATOR CRE 0-12 8 (BALLOONS)
BALLN DILATOR ESOPH 8 10 CRE (MISCELLANEOUS) IMPLANT
BALLOON DILATOR 12-15 8 (BALLOONS) IMPLANT
BALLOON DILATOR 15-18 8 (BALLOONS) IMPLANT
BALLOON DILATOR CRE 0-12 8 (BALLOONS) IMPLANT
BLOCK BITE 60FR ADLT L/F GRN (MISCELLANEOUS) ×4 IMPLANT
CANISTER SUCT 1200ML W/VALVE (MISCELLANEOUS) ×4 IMPLANT
CLIP HMST 235XBRD CATH ROT (MISCELLANEOUS) IMPLANT
CLIP RESOLUTION 360 11X235 (MISCELLANEOUS)
ELECT REM PT RETURN 9FT ADLT (ELECTROSURGICAL)
ELECTRODE REM PT RTRN 9FT ADLT (ELECTROSURGICAL) IMPLANT
FCP ESCP3.2XJMB 240X2.8X (MISCELLANEOUS)
FORCEPS BIOP RAD 4 LRG CAP 4 (CUTTING FORCEPS) ×4 IMPLANT
FORCEPS BIOP RJ4 240 W/NDL (MISCELLANEOUS)
FORCEPS ESCP3.2XJMB 240X2.8X (MISCELLANEOUS) IMPLANT
GOWN CVR UNV OPN BCK APRN NK (MISCELLANEOUS) ×4 IMPLANT
GOWN ISOL THUMB LOOP REG UNIV (MISCELLANEOUS) ×4
INJECTOR VARIJECT VIN23 (MISCELLANEOUS) IMPLANT
KIT DEFENDO VALVE AND CONN (KITS) IMPLANT
KIT ENDO PROCEDURE OLY (KITS) ×4 IMPLANT
MARKER SPOT ENDO TATTOO 5ML (MISCELLANEOUS) IMPLANT
PROBE APC STR FIRE (PROBE) IMPLANT
RETRIEVER NET PLAT FOOD (MISCELLANEOUS) IMPLANT
RETRIEVER NET ROTH 2.5X230 LF (MISCELLANEOUS) IMPLANT
SNARE SHORT THROW 13M SML OVAL (MISCELLANEOUS) IMPLANT
SNARE SHORT THROW 30M LRG OVAL (MISCELLANEOUS) IMPLANT
SNARE SNG USE RND 15MM (INSTRUMENTS) IMPLANT
SPOT EX ENDOSCOPIC TATTOO (MISCELLANEOUS)
SYR INFLATION 60ML (SYRINGE) IMPLANT
TRAP ETRAP POLY (MISCELLANEOUS) IMPLANT
VARIJECT INJECTOR VIN23 (MISCELLANEOUS)
WATER STERILE IRR 250ML POUR (IV SOLUTION) ×4 IMPLANT
WIRE CRE 18-20MM 8CM F G (MISCELLANEOUS) IMPLANT

## 2017-04-11 NOTE — Anesthesia Procedure Notes (Signed)
Date/Time: 04/11/2017 10:21 AM Performed by: Cameron Ali, CRNA Pre-anesthesia Checklist: Patient identified, Emergency Drugs available, Suction available, Timeout performed and Patient being monitored Patient Re-evaluated:Patient Re-evaluated prior to induction Oxygen Delivery Method: Nasal cannula Placement Confirmation: positive ETCO2

## 2017-04-11 NOTE — Op Note (Addendum)
[facility] Patient Name: Betty Walters Procedure Date: 04/11/2017 10:12 AM MRN: [id number] Account #: [id number] Date of Birth: 10-26-1960 Admit Type: Outpatient Age: 57 Room: [facility] OR ROOM 01 Gender: Female Note Status: Supervisor Override Procedure:            Upper GI endoscopy Indications:          Dyspepsia, Heartburn Providers:            Lucilla Lame MD, MD Referring MD:         Juline Patch, MD (Referring MD) Medicines:            Propofol per Anesthesia Complications:        No immediate complications. Procedure:            Pre-Anesthesia Assessment:                       - Prior to the procedure, a History and Physical was                        performed, and patient medications and allergies were                        reviewed. The patient's tolerance of previous                        anesthesia was also reviewed. The risks and benefits of                        the procedure and the sedation options and risks were                        discussed with the patient. All questions were                        answered, and informed consent was obtained. Prior                        Anticoagulants: The patient has taken no previous                        anticoagulant or antiplatelet agents. ASA Grade                        Assessment: II - A patient with mild systemic disease.                        After reviewing the risks and benefits, the patient was                        deemed in satisfactory condition to undergo the                        procedure.                       After obtaining informed consent, the endoscope was                        passed under direct vision. Throughout the procedure,  the patient's blood pressure, pulse, and oxygen                        saturations were monitored continuously. The Olympus                        [id number]-HQ190 Endoscope (S#. [id number]) was introduced            through the mouth, and advanced to the second part of                        duodenum. The upper GI endoscopy was accomplished                        without difficulty. The patient tolerated the procedure                        well. Findings:      The examined esophagus was normal. Two biopsies were obtained in the       middle third of the esophagus with cold forceps for histology.      The stomach was normal.      The examined duodenum was normal. Impression:           - Normal esophagus.                       - Normal stomach.                       - Normal examined duodenum.                       - Two biopsies were obtained in the middle third of the                        esophagus. Recommendation:       - Discharge patient to home.                       - Resume previous diet.                       - Continue present medications. Procedure Code(s):    --- Professional ---                       [phone], Esophagogastroduodenoscopy, flexible, transoral;                        with biopsy, single or multiple Diagnosis Code(s):    --- Professional ---                       R12, Heartburn                       R10.13, Epigastric pain CPT copyright 2018 American Medical Association. All rights reserved. The codes documented in this report are preliminary and upon coder review may  be revised to meet current compliance requirements. Lucilla Lame MD, MD 04/11/2017 10:31:43 AM This report has been signed electronically. Number of Addenda: 0 Note Initiated On: 04/11/2017 10:12 AM Total Procedure Duration: 0 hours 2 minutes 30 seconds       [facility]

## 2017-04-11 NOTE — H&P (Signed)
Lucilla Lame, MD [address] [place], [place] [address] Phone:[phone] Fax : [phone]  Primary Care Physician:  Juline Patch, MD Primary Gastroenterologist:  Dr. Allen Norris  Pre-Procedure History & Physical: HPI:  Betty Walters is a 57 y.o. female is here for an endoscopy and colonoscopy.   Past Medical History:  Diagnosis Date  . Concussion May 2016  . Depression   . Diabetes mellitus without complication ([place])   . Family history of adverse reaction to anesthesia    Father - slow to wake  . GERD (gastroesophageal reflux disease)   . High cholesterol   . Hypertension   . Yeast infection     Past Surgical History:  Procedure Laterality Date  . CESAREAN SECTION    . GALLBLADDER SURGERY    . KNEE SURGERY    . WRIST SURGERY      Prior to Admission medications   Medication Sig Start Date End Date Taking? Authorizing Provider  amoxicillin (AMOXIL) 500 MG capsule Take 1 capsule (500 mg total) by mouth 3 (three) times daily. 03/28/17  Yes Juline Patch, MD  dicyclomine (BENTYL) 20 MG tablet Take 1 tablet (20 mg total) by mouth every 6 (six) hours as needed. 02/25/17  Yes Juline Patch, MD  liraglutide 18 MG/3ML SOPN Inject 0.3 mLs (1.8 mg total) daily into the skin. Patient taking differently: Inject 1.8 mg into the skin daily. Dispense 3 pens at a time 02/12/17  Yes Juline Patch, MD  lisinopril (PRINIVIL,ZESTRIL) 20 MG tablet TAKE ONE TABLET BY MOUTH ONCE DAILY. NEEDS TO SCHEDULE APPOINTMENT FOR FURTHER REFILLS 09/20/16  Yes Juline Patch, MD  lovastatin (MEVACOR) 20 MG tablet TAKE ONE TABLET BY MOUTH IN THE MORNING---SCHEDULE APPOINTMENT TO BE SEEN 09/20/16  Yes Juline Patch, MD  metFORMIN (GLUCOPHAGE) 1000 MG tablet Take 1 tablet (1,000 mg total) 2 (two) times daily by mouth. 02/10/17  Yes Juline Patch, MD  pantoprazole (PROTONIX) 40 MG tablet Take 1 tablet (40 mg total) daily by mouth. Patient taking differently: Take 40 mg by mouth daily.  Dr Allen Norris 02/10/17  Yes Juline Patch, MD  pioglitazone (ACTOS) 15 MG tablet Take 1 tablet (15 mg total) daily by mouth. 02/12/17  Yes Juline Patch, MD  promethazine (PHENERGAN) 25 MG tablet Take 1 tablet (25 mg total) by mouth every 8 (eight) hours as needed for nausea or vomiting. 01/15/17  Yes Juline Patch, MD  sertraline (ZOLOFT) 100 MG tablet TAKE ONE TABLET BY MOUTH ONCE DAILY. PATIENT TO TAKE A TOTAL OF 150 MG DAILY. 09/20/16  Yes Juline Patch, MD  sertraline (ZOLOFT) 50 MG tablet Take 1 tablet (50 mg total) by mouth daily. 09/20/16  Yes Juline Patch, MD    Allergies as of 03/13/2017 - Review Complete 03/12/2017  Allergen Reaction Noted  . Amoxicillin-pot clavulanate Nausea And Vomiting 01/26/2014    Family History  Problem Relation Age of Onset  . Cancer Mother   . Diabetes Father     Social History   Socioeconomic History  . Marital status: Married    Spouse name: Not on file  . Number of children: Not on file  . Years of education: Not on file  . Highest education level: Not on file  Social Needs  . Financial resource strain: Not on file  . Food insecurity - worry: Not on file  . Food insecurity - inability: Not on file  . Transportation needs - medical: Not on  file  . Transportation needs - non-medical: Not on file  Occupational History  . Not on file  Tobacco Use  . Smoking status: Never Smoker  . Smokeless tobacco: Never Used  Substance and Sexual Activity  . Alcohol use: Yes    Alcohol/week: 0.0 oz    Comment: rare - Holidays  . Drug use: No  . Sexual activity: Yes  Other Topics Concern  . Not on file  Social History Narrative  . Not on file    Review of Systems: See HPI, otherwise negative ROS  Physical Exam: BP (!) 150/76   Pulse 84   Temp 97.6 F (36.4 C) (Tympanic)   Resp 17   Ht 5\' 8"  (1.727 m)   Wt 194 lb (88 kg)   SpO2 100%   BMI 29.50 kg/m  General:   Alert,  pleasant and cooperative in NAD Head:  Normocephalic and  atraumatic. Neck:  Supple; no masses or thyromegaly. Lungs:  Clear throughout to auscultation.    Heart:  Regular rate and rhythm. Abdomen:  Soft, nontender and nondistended. Normal bowel sounds, without guarding, and without rebound.   Neurologic:  Alert and  oriented x4;  grossly normal neurologically.  Impression/Plan: Betty Walters is here for an endoscopy and colonoscopy to be performed for GERD and change in bowel habits  Risks, benefits, limitations, and alternatives regarding  endoscopy and colonoscopy have been reviewed with the patient.  Questions have been answered.  All parties agreeable.   Lucilla Lame, MD  04/11/2017, 9:34 AM

## 2017-04-11 NOTE — Anesthesia Preprocedure Evaluation (Signed)
Anesthesia Evaluation  Patient identified by MRN, date of birth, ID band Patient awake    Reviewed: Allergy & Precautions, NPO status , Patient's Chart, lab work & pertinent test results  History of Anesthesia Complications Negative for: history of anesthetic complications  Airway Mallampati: I  TM Distance: >3 FB Neck ROM: Full    Dental no notable dental hx.    Pulmonary neg pulmonary ROS,    Pulmonary exam normal breath sounds clear to auscultation       Cardiovascular Exercise Tolerance: Good hypertension, Normal cardiovascular exam Rhythm:Regular Rate:Normal     Neuro/Psych PSYCHIATRIC DISORDERS Depression negative neurological ROS     GI/Hepatic   Endo/Other  diabetes, Type 2  Renal/GU negative Renal ROS     Musculoskeletal   Abdominal   Peds  Hematology negative hematology ROS (+)   Anesthesia Other Findings   Reproductive/Obstetrics                             Anesthesia Physical Anesthesia Plan  ASA: II  Anesthesia Plan: General   Post-op Pain Management:    Induction: Intravenous  PONV Risk Score and Plan: 2 and Propofol infusion  Airway Management Planned: Natural Airway  Additional Equipment:   Intra-op Plan:   Post-operative Plan:   Informed Consent: I have reviewed the patients History and Physical, chart, labs and discussed the procedure including the risks, benefits and alternatives for the proposed anesthesia with the patient or authorized representative who has indicated his/her understanding and acceptance.     Plan Discussed with: CRNA  Anesthesia Plan Comments:         Anesthesia Quick Evaluation

## 2017-04-11 NOTE — Anesthesia Postprocedure Evaluation (Signed)
Anesthesia Post Note  Patient: Jessenya Berdan Derouin  Procedure(s) Performed: COLONOSCOPY WITH colon (N/A ) ESOPHAGOGASTRODUODENOSCOPY (EGD) WITH PROPOFOL (N/A ) POLYPECTOMY INTESTINAL  Patient location during evaluation: PACU Anesthesia Type: General Level of consciousness: awake and alert, oriented and patient cooperative Pain management: pain level controlled Vital Signs Assessment: post-procedure vital signs reviewed and stable Respiratory status: spontaneous breathing, nonlabored ventilation and respiratory function stable Cardiovascular status: blood pressure returned to baseline and stable Postop Assessment: adequate PO intake Anesthetic complications: no    Darrin Nipper

## 2017-04-11 NOTE — Op Note (Signed)
[facility] Patient Name: Betty Walters Procedure Date: 04/11/2017 10:31 AM MRN: [id number] Account #: [id number] Date of Birth: 1960-12-09 Admit Type: Outpatient Age: 57 Room: [facility] OR ROOM 01 Gender: Female Note Status: Finalized Procedure:            Colonoscopy Indications:          Incidental change in bowel habits noted Providers:            Lucilla Lame MD, MD Referring MD:         Juline Patch, MD (Referring MD) Medicines:            Propofol per Anesthesia Complications:        No immediate complications. Procedure:            Pre-Anesthesia Assessment:                       - Prior to the procedure, a History and Physical was                        performed, and patient medications and allergies were                        reviewed. The patient's tolerance of previous                        anesthesia was also reviewed. The risks and benefits of                        the procedure and the sedation options and risks were                        discussed with the patient. All questions were                        answered, and informed consent was obtained. Prior                        Anticoagulants: The patient has taken no previous                        anticoagulant or antiplatelet agents. ASA Grade                        Assessment: II - A patient with mild systemic disease.                        After reviewing the risks and benefits, the patient was                        deemed in satisfactory condition to undergo the                        procedure.                       After obtaining informed consent, the colonoscope was                        passed under direct vision. Throughout the procedure,  the patient's blood pressure, pulse, and oxygen                        saturations were monitored continuously. The [place] [id number]) was introduced through the                      anus and advanced to the the cecum, identified by                        appendiceal orifice and ileocecal valve. The                        colonoscopy was performed without difficulty. The                        patient tolerated the procedure well. The quality of                        the bowel preparation was excellent. Findings:      The perianal and digital rectal examinations were normal.      A 4 mm polyp was found in the descending colon. The polyp was sessile.       The polyp was removed with a cold biopsy forceps. Resection and       retrieval were complete.      Non-bleeding internal hemorrhoids were found during retroflexion. The       hemorrhoids were Grade I (internal hemorrhoids that do not prolapse).      Random biopsies were obtained with cold forceps for histology randomly       in the entire colon. Impression:           - One 4 mm polyp in the descending colon, removed with                        a cold biopsy forceps. Resected and retrieved.                       - Non-bleeding internal hemorrhoids.                       - Random biopsies were obtained in the entire colon. Recommendation:       - Discharge patient to home.                       - Resume previous diet.                       - Continue present medications.                       - Await pathology results. Procedure Code(s):    --- Professional ---                       [phone], Colonoscopy, flexible; with biopsy, single or                        multiple Diagnosis Code(s):    --- Professional ---  D12.4, Benign neoplasm of descending colon CPT copyright 2016 American Medical Association. All rights reserved. The codes documented in this report are preliminary and upon coder review may  be revised to meet current compliance requirements. Lucilla Lame MD, MD 04/11/2017 10:48:33 AM This report has been signed electronically. Number of Addenda: 0 Note Initiated On:  04/11/2017 10:31 AM Scope Withdrawal Time: 0 hours 8 minutes 52 seconds  Total Procedure Duration: 0 hours 11 minutes 49 seconds       [facility]

## 2017-04-11 NOTE — Transfer of Care (Signed)
Immediate Anesthesia Transfer of Care Note  Patient: Betty Walters  Procedure(s) Performed: COLONOSCOPY WITH colon (N/A ) ESOPHAGOGASTRODUODENOSCOPY (EGD) WITH PROPOFOL (N/A ) POLYPECTOMY INTESTINAL  Patient Location: PACU  Anesthesia Type: General  Level of Consciousness: awake, alert  and patient cooperative  Airway and Oxygen Therapy: Patient Spontanous Breathing and Patient connected to supplemental oxygen  Post-op Assessment: Post-op Vital signs reviewed, Patient's Cardiovascular Status Stable, Respiratory Function Stable, Patent Airway and No signs of Nausea or vomiting  Post-op Vital Signs: Reviewed and stable  Complications: No apparent anesthesia complications

## 2017-04-12 ENCOUNTER — Other Ambulatory Visit: Payer: Self-pay | Admitting: Family Medicine

## 2017-04-12 DIAGNOSIS — E119 Type 2 diabetes mellitus without complications: Secondary | ICD-10-CM

## 2017-04-14 ENCOUNTER — Encounter: Payer: Self-pay | Admitting: Gastroenterology

## 2017-04-23 ENCOUNTER — Ambulatory Visit: Payer: BC Managed Care – PPO | Admitting: Family Medicine

## 2017-04-23 ENCOUNTER — Telehealth: Payer: Self-pay

## 2017-04-23 NOTE — Telephone Encounter (Signed)
Patient left a voice message that she is returning your call. Please call [phone] work

## 2017-04-23 NOTE — Telephone Encounter (Signed)
-----   Message from Lucilla Lame, MD sent at 04/16/2017  7:53 AM EST ----- Please have the patient come in for a follow up.

## 2017-04-23 NOTE — Telephone Encounter (Signed)
Left vm to have pt schedule a EGD/Colonoscopy results follow up. Offered Monday, Feb 4th or Thurs, Feb 7th in [place].

## 2017-04-24 ENCOUNTER — Encounter: Payer: Self-pay | Admitting: Family Medicine

## 2017-04-24 ENCOUNTER — Ambulatory Visit: Payer: BC Managed Care – PPO | Admitting: Family Medicine

## 2017-04-24 VITALS — BP 110/80 | HR 72 | Ht 68.0 in | Wt 203.0 lb

## 2017-04-24 DIAGNOSIS — F330 Major depressive disorder, recurrent, mild: Secondary | ICD-10-CM | POA: Diagnosis not present

## 2017-04-24 DIAGNOSIS — E119 Type 2 diabetes mellitus without complications: Secondary | ICD-10-CM

## 2017-04-24 MED ORDER — PIOGLITAZONE HCL 15 MG PO TABS: 15.0000 mg | ORAL_TABLET | Freq: Every day | ORAL | 1 refills | Status: DC

## 2017-04-24 MED ORDER — METFORMIN HCL 1000 MG PO TABS: 1000.0000 mg | ORAL_TABLET | Freq: Two times a day (BID) | ORAL | 6 refills | Status: DC

## 2017-04-24 MED ORDER — LIRAGLUTIDE 18 MG/3ML ~~LOC~~ SOPN: 1.8000 mg | PEN_INJECTOR | Freq: Every day | SUBCUTANEOUS | 5 refills | Status: DC

## 2017-04-24 NOTE — Progress Notes (Signed)
Name: Betty Walters   MRN: [id number]    DOB: 1960/09/14   Date:04/24/2017       Progress Note  Subjective  Chief Complaint  Chief Complaint  Patient presents with  . Diabetes    added Actos 2 months ago- recheck A1C.     Diabetes  She presents for her follow-up diabetic visit. She has type 2 diabetes mellitus. Her disease course has been stable. There are no hypoglycemic associated symptoms. Pertinent negatives for hypoglycemia include no dizziness, headaches or nervousness/anxiousness. Pertinent negatives for diabetes include no blurred vision, no chest pain, no fatigue, no foot paresthesias, no foot ulcerations, no polydipsia, no polyphagia, no polyuria, no visual change, no weakness and no weight loss. There are no hypoglycemic complications. Symptoms are stable. There are no diabetic complications. Risk factors for coronary artery disease include diabetes mellitus, dyslipidemia and hypertension. Current diabetic treatment includes oral agent (dual therapy). She is compliant with treatment most of the time. Her weight is stable. She is following a generally unhealthy diet. Her home blood glucose trend is fluctuating dramatically. Her breakfast blood glucose is taken between 8-9 am. Her breakfast blood glucose range is generally 140-180 mg/dl. An ACE inhibitor/angiotensin II receptor blocker is being taken.    No problem-specific Assessment & Plan notes found for this encounter.   Past Medical History:  Diagnosis Date  . Concussion May 2016  . Depression   . Diabetes mellitus without complication ([place])   . Family history of adverse reaction to anesthesia    Father - slow to wake  . GERD (gastroesophageal reflux disease)   . High cholesterol   . Hypertension   . Yeast infection     Past Surgical History:  Procedure Laterality Date  . CESAREAN SECTION    . COLONOSCOPY WITH PROPOFOL N/A 04/11/2017   Procedure: COLONOSCOPY WITH colon;  Surgeon: Lucilla Lame, MD;  [facility];  Service: Endoscopy;  Laterality: N/A;  . ESOPHAGOGASTRODUODENOSCOPY (EGD) WITH PROPOFOL N/A 04/11/2017   Procedure: ESOPHAGOGASTRODUODENOSCOPY (EGD) WITH PROPOFOL;  Surgeon: Lucilla Lame, MD;  Location: [facility];  Service: Endoscopy;  Laterality: N/A;  Diabetic - oral meds  . GALLBLADDER SURGERY    . KNEE SURGERY    . POLYPECTOMY  04/11/2017   Procedure: POLYPECTOMY INTESTINAL;  Surgeon: Lucilla Lame, MD;  Location: [facility];  Service: Endoscopy;;  . WRIST SURGERY      Family History  Problem Relation Age of Onset  . Cancer Mother   . Diabetes Father     Social History   Socioeconomic History  . Marital status: Married    Spouse name: Not on file  . Number of children: Not on file  . Years of education: Not on file  . Highest education level: Not on file  Social Needs  . Financial resource strain: Not on file  . Food insecurity - worry: Not on file  . Food insecurity - inability: Not on file  . Transportation needs - medical: Not on file  . Transportation needs - non-medical: Not on file  Occupational History  . Not on file  Tobacco Use  . Smoking status: Never Smoker  . Smokeless tobacco: Never Used  Substance and Sexual Activity  . Alcohol use: Yes    Alcohol/week: 0.0 oz    Comment: rare - Holidays  . Drug use: No  . Sexual activity: Yes  Other Topics Concern  . Not on file  Social History Narrative  . Not on file  Allergies  Allergen Reactions  . Amoxicillin-Pot Clavulanate Nausea And Vomiting    (OK with Amoxicillin alone)    Outpatient Medications Prior to Visit  Medication Sig Dispense Refill  . lisinopril (PRINIVIL,ZESTRIL) 20 MG tablet TAKE ONE TABLET BY MOUTH ONCE DAILY. NEEDS TO SCHEDULE APPOINTMENT FOR FURTHER REFILLS 30 tablet 6  . lovastatin (MEVACOR) 20 MG tablet TAKE ONE TABLET BY MOUTH IN THE MORNING---SCHEDULE APPOINTMENT TO BE SEEN 30 tablet 6  . pantoprazole (PROTONIX) 40 MG tablet Take 1 tablet (40 mg  total) daily by mouth. (Patient taking differently: Take 40 mg by mouth daily. Dr Allen Norris) 30 tablet 3  . sertraline (ZOLOFT) 100 MG tablet TAKE ONE TABLET BY MOUTH ONCE DAILY. PATIENT TO TAKE A TOTAL OF 150 MG DAILY. 30 tablet 6  . sertraline (ZOLOFT) 50 MG tablet Take 1 tablet (50 mg total) by mouth daily. 30 tablet 6  . liraglutide 18 MG/3ML SOPN Inject 0.3 mLs (1.8 mg total) daily into the skin. (Patient taking differently: Inject 1.8 mg into the skin daily. Dispense 3 pens at a time) 3 mL 4  . metFORMIN (GLUCOPHAGE) 1000 MG tablet Take 1 tablet (1,000 mg total) 2 (two) times daily by mouth. 60 tablet 6  . pioglitazone (ACTOS) 15 MG tablet TAKE 1 TABLET BY MOUTH ONCE DAILY 30 tablet 1  . dicyclomine (BENTYL) 20 MG tablet Take 1 tablet (20 mg total) by mouth every 6 (six) hours as needed. (Patient not taking: Reported on 04/24/2017) 30 tablet 0  . amoxicillin (AMOXIL) 500 MG capsule Take 1 capsule (500 mg total) by mouth 3 (three) times daily. 30 capsule 0  . promethazine (PHENERGAN) 25 MG tablet Take 1 tablet (25 mg total) by mouth every 8 (eight) hours as needed for nausea or vomiting. 20 tablet 0   No facility-administered medications prior to visit.     Review of Systems  Constitutional: Negative for chills, fatigue, fever, malaise/fatigue and weight loss.  HENT: Negative for ear discharge, ear pain and sore throat.   Eyes: Negative for blurred vision.  Respiratory: Negative for cough, sputum production, shortness of breath and wheezing.   Cardiovascular: Negative for chest pain, palpitations and leg swelling.  Gastrointestinal: Negative for abdominal pain, blood in stool, constipation, diarrhea, heartburn, melena and nausea.  Genitourinary: Negative for dysuria, frequency, hematuria and urgency.  Musculoskeletal: Negative for back pain, joint pain, myalgias and neck pain.  Skin: Negative for rash.  Neurological: Negative for dizziness, tingling, sensory change, focal weakness, weakness and  headaches.  Endo/Heme/Allergies: Negative for environmental allergies, polydipsia and polyphagia. Does not bruise/bleed easily.  Psychiatric/Behavioral: Negative for depression and suicidal ideas. The patient is not nervous/anxious and does not have insomnia.      Objective  Vitals:   04/24/17 1601  BP: 110/80  Pulse: 72  Weight: 203 lb (92.1 kg)  Height: 5\' 8"  (1.727 m)    Physical Exam  Constitutional: She is well-developed, well-nourished, and in no distress. No distress.  HENT:  Head: Normocephalic and atraumatic.  Right Ear: External ear normal.  Left Ear: External ear normal.  Nose: Nose normal.  Mouth/Throat: Oropharynx is clear and moist.  Eyes: Conjunctivae and EOM are normal. Pupils are equal, round, and reactive to light. Right eye exhibits no discharge. Left eye exhibits no discharge.  Neck: Normal range of motion. Neck supple. No JVD present. No thyromegaly present.  Cardiovascular: Normal rate, regular rhythm, normal heart sounds and intact distal pulses. Exam reveals no gallop and no friction rub.  No murmur  heard. Pulmonary/Chest: Effort normal and breath sounds normal. She has no wheezes. She has no rales.  Abdominal: Soft. Bowel sounds are normal. She exhibits no mass. There is no tenderness. There is no guarding.  Musculoskeletal: Normal range of motion. She exhibits no edema.  Lymphadenopathy:    She has no cervical adenopathy.  Neurological: She is alert. She has normal reflexes.  Skin: Skin is warm and dry. She is not diaphoretic.  Psychiatric: Mood and affect normal.  Nursing note and vitals reviewed.     Assessment & Plan  Problem List Items Addressed This Visit      Endocrine   Diabetes mellitus, type 2 ([place]) - Primary   Relevant Medications   pioglitazone (ACTOS) 15 MG tablet   metFORMIN (GLUCOPHAGE) 1000 MG tablet   liraglutide (VICTOZA) 18 MG/3ML SOPN   Other Relevant Orders   Hemoglobin A1c     Other   Clinical depression       Meds ordered this encounter  Medications  . pioglitazone (ACTOS) 15 MG tablet    Sig: Take 1 tablet (15 mg total) by mouth daily.    Dispense:  30 tablet    Refill:  1    Please consider 90 day supplies to promote better adherence  . metFORMIN (GLUCOPHAGE) 1000 MG tablet    Sig: Take 1 tablet (1,000 mg total) by mouth 2 (two) times daily.    Dispense:  60 tablet    Refill:  6  . liraglutide (VICTOZA) 18 MG/3ML SOPN    Sig: Inject 0.3 mLs (1.8 mg total) into the skin daily.    Dispense:  3 pen    Refill:  5      Dr. [facility]  04/24/17

## 2017-04-25 LAB — HEMOGLOBIN A1C
ESTIMATED AVERAGE GLUCOSE: 151 mg/dL
Hgb A1c MFr Bld: 6.9 % — ABNORMAL HIGH (ref 4.8–5.6)

## 2017-05-05 ENCOUNTER — Encounter: Payer: Self-pay | Admitting: Gastroenterology

## 2017-05-05 ENCOUNTER — Ambulatory Visit (INDEPENDENT_AMBULATORY_CARE_PROVIDER_SITE_OTHER): Payer: BC Managed Care – PPO | Admitting: Gastroenterology

## 2017-05-05 ENCOUNTER — Other Ambulatory Visit: Payer: Self-pay

## 2017-05-05 VITALS — BP 110/54 | HR 85 | Ht 68.0 in | Wt 208.0 lb

## 2017-05-05 DIAGNOSIS — K219 Gastro-esophageal reflux disease without esophagitis: Secondary | ICD-10-CM

## 2017-05-05 DIAGNOSIS — K582 Mixed irritable bowel syndrome: Secondary | ICD-10-CM

## 2017-05-05 NOTE — Progress Notes (Signed)
Primary Care Physician: Juline Patch, MD  Primary Gastroenterologist:  Dr. Lucilla Lame  No chief complaint on file.   HPI: Betty Walters is a 57 y.o. female here for follow-up after having an EGD and colonoscopy for a change in bowel habits and dyspepsia.  The patient was also reported heartburn.  The patient had a normal upper endoscopy and biopsies of the midesophagus showed her to have signs of chronic reflux.  The patient also had a colonoscopy with random biopsies taken that did not show any pathological changes to explain her change in bowel habits.  The patient is now here for follow-up of her procedures and pathology and to optimize her medication to decrease her irritable bowel syndrome symptoms and heartburn.  Current Outpatient Medications  Medication Sig Dispense Refill  . dicyclomine (BENTYL) 20 MG tablet Take 1 tablet (20 mg total) by mouth every 6 (six) hours as needed. (Patient not taking: Reported on 04/24/2017) 30 tablet 0  . liraglutide (VICTOZA) 18 MG/3ML SOPN Inject 0.3 mLs (1.8 mg total) into the skin daily. 3 pen 5  . lisinopril (PRINIVIL,ZESTRIL) 20 MG tablet TAKE ONE TABLET BY MOUTH ONCE DAILY. NEEDS TO SCHEDULE APPOINTMENT FOR FURTHER REFILLS 30 tablet 6  . lovastatin (MEVACOR) 20 MG tablet TAKE ONE TABLET BY MOUTH IN THE MORNING---SCHEDULE APPOINTMENT TO BE SEEN 30 tablet 6  . metFORMIN (GLUCOPHAGE) 1000 MG tablet Take 1 tablet (1,000 mg total) by mouth 2 (two) times daily. 60 tablet 6  . pantoprazole (PROTONIX) 40 MG tablet Take 1 tablet (40 mg total) daily by mouth. (Patient taking differently: Take 40 mg by mouth daily. Dr Allen Norris) 30 tablet 3  . pioglitazone (ACTOS) 15 MG tablet Take 1 tablet (15 mg total) by mouth daily. 30 tablet 1  . sertraline (ZOLOFT) 100 MG tablet TAKE ONE TABLET BY MOUTH ONCE DAILY. PATIENT TO TAKE A TOTAL OF 150 MG DAILY. 30 tablet 6  . sertraline (ZOLOFT) 50 MG tablet Take 1 tablet (50 mg total) by mouth daily. 30 tablet 6   No  current facility-administered medications for this visit.     Allergies as of 05/05/2017 - Review Complete 04/24/2017  Allergen Reaction Noted  . Amoxicillin-pot clavulanate Nausea And Vomiting 01/26/2014    ROS:  General: Negative for anorexia, weight loss, fever, chills, fatigue, weakness. ENT: Negative for hoarseness, difficulty swallowing , nasal congestion. CV: Negative for chest pain, angina, palpitations, dyspnea on exertion, peripheral edema.  Respiratory: Negative for dyspnea at rest, dyspnea on exertion, cough, sputum, wheezing.  GI: See history of present illness. GU:  Negative for dysuria, hematuria, urinary incontinence, urinary frequency, nocturnal urination.  Endo: Negative for unusual weight change.    Physical Examination:   There were no vitals taken for this visit.  General: Well-nourished, well-developed in no acute distress.  Eyes: No icterus. Conjunctivae pink. Mouth: Oropharyngeal mucosa moist and pink , no lesions erythema or exudate. Lungs: Clear to auscultation bilaterally. Non-labored. Heart: Regular rate and rhythm, no murmurs rubs or gallops.  Abdomen: Bowel sounds are normal, nontender, nondistended, no hepatosplenomegaly or masses, no abdominal bruits or hernia , no rebound or guarding.   Extremities: No lower extremity edema. No clubbing or deformities. Neuro: Alert and oriented x 3.  Grossly intact. Skin: Warm and dry, no jaundice.   Psych: Alert and cooperative, normal mood and affect.  Labs:    Imaging Studies: No results found.  Assessment and Plan:   Betty Walters is a 58 y.o. y/o female  who had an EGD and colonoscopy that showed mid esophageal changes consistent with chronic reflux and normal biopsies throughout the colon. The patient had been started on Protonix back in November and despite taking the medication had findings consistent with reflux in the midesophagus.  The patient will be started on a trial of Dexilant to see if that  helps her symptoms any better.  The patient continues to have dyspepsia with burping and reports alternating diarrhea and constipation.  The patient has been given samples of Dexilant and for VSL #3 probiotics.  The patient has been explained the plan and agrees with it.    Lucilla Lame, MD. Marval Regal   Note: This dictation was prepared with Dragon dictation along with smaller phrase technology. Any transcriptional errors that result from this process are unintentional.

## 2017-05-19 ENCOUNTER — Other Ambulatory Visit: Payer: Self-pay | Admitting: Family Medicine

## 2017-05-19 DIAGNOSIS — E782 Mixed hyperlipidemia: Secondary | ICD-10-CM

## 2017-05-19 DIAGNOSIS — F3341 Major depressive disorder, recurrent, in partial remission: Secondary | ICD-10-CM

## 2017-05-19 DIAGNOSIS — I10 Essential (primary) hypertension: Secondary | ICD-10-CM

## 2017-05-20 ENCOUNTER — Telehealth: Payer: Self-pay | Admitting: Gastroenterology

## 2017-05-20 ENCOUNTER — Other Ambulatory Visit: Payer: Self-pay

## 2017-05-20 MED ORDER — DEXLANSOPRAZOLE 60 MG PO CPDR: 60.0000 mg | DELAYED_RELEASE_CAPSULE | Freq: Every day | ORAL | 6 refills | Status: DC

## 2017-05-20 NOTE — Telephone Encounter (Signed)
Rx for Dexilant 60mg  sent to pt's pharmacy. Samples have also been placed upfront for pick up.

## 2017-05-20 NOTE — Telephone Encounter (Signed)
Patient called and was given samples of Dexilant 60 mg.This has really helped her & she would now like a script called into her pharmacy at [facility] on [address].Please call her once this has been called in @ [phone].

## 2017-05-29 ENCOUNTER — Ambulatory Visit: Payer: BC Managed Care – PPO | Admitting: Family Medicine

## 2017-05-29 ENCOUNTER — Encounter: Payer: Self-pay | Admitting: Family Medicine

## 2017-05-29 VITALS — BP 138/80 | HR 76 | Temp 98.7°F | Ht 68.0 in | Wt 203.0 lb

## 2017-05-29 DIAGNOSIS — J0100 Acute maxillary sinusitis, unspecified: Secondary | ICD-10-CM | POA: Diagnosis not present

## 2017-05-29 MED ORDER — AZITHROMYCIN 250 MG PO TABS: ORAL_TABLET | ORAL | 0 refills | Status: DC

## 2017-05-29 NOTE — Progress Notes (Signed)
Name: Betty Walters   MRN: [id number]    DOB: 10-11-1960   Date:05/29/2017       Progress Note  Subjective  Chief Complaint  Chief Complaint  Patient presents with  . Sinusitis    had round of Amox on Jan 4- is having sore throat, R) ear pain, cough, nose is running    Sinusitis  This is a new problem. The current episode started in the past 7 days. The problem has been waxing and waning since onset. There has been no fever. The pain is mild. Associated symptoms include congestion, ear pain, sinus pressure and a sore throat. Pertinent negatives include no chills, coughing, diaphoresis, headaches, hoarse voice, neck pain, shortness of breath, sneezing or swollen glands. Past treatments include oral decongestants. The treatment provided mild relief.    No problem-specific Assessment & Plan notes found for this encounter.   Past Medical History:  Diagnosis Date  . Concussion May 2016  . Depression   . Diabetes mellitus without complication ([place])   . Family history of adverse reaction to anesthesia    Father - slow to wake  . GERD (gastroesophageal reflux disease)   . High cholesterol   . Hypertension   . Yeast infection     Past Surgical History:  Procedure Laterality Date  . CESAREAN SECTION    . COLONOSCOPY WITH PROPOFOL N/A 04/11/2017   Procedure: COLONOSCOPY WITH colon;  Surgeon: Lucilla Lame, MD;  Location: [facility];  Service: Endoscopy;  Laterality: N/A;  . ESOPHAGOGASTRODUODENOSCOPY (EGD) WITH PROPOFOL N/A 04/11/2017   Procedure: ESOPHAGOGASTRODUODENOSCOPY (EGD) WITH PROPOFOL;  Surgeon: Lucilla Lame, MD;  Location: [facility];  Service: Endoscopy;  Laterality: N/A;  Diabetic - oral meds  . GALLBLADDER SURGERY    . KNEE SURGERY    . POLYPECTOMY  04/11/2017   Procedure: POLYPECTOMY INTESTINAL;  Surgeon: Lucilla Lame, MD;  Location: [place];  Service: Endoscopy;;  . WRIST SURGERY      Family History  Problem Relation Age of Onset  .  Cancer Mother   . Diabetes Father     Social History   Socioeconomic History  . Marital status: Married    Spouse name: Not on file  . Number of children: Not on file  . Years of education: Not on file  . Highest education level: Not on file  Social Needs  . Financial resource strain: Not on file  . Food insecurity - worry: Not on file  . Food insecurity - inability: Not on file  . Transportation needs - medical: Not on file  . Transportation needs - non-medical: Not on file  Occupational History  . Not on file  Tobacco Use  . Smoking status: Never Smoker  . Smokeless tobacco: Never Used  Substance and Sexual Activity  . Alcohol use: Yes    Alcohol/week: 0.0 oz    Comment: rare - Holidays  . Drug use: No  . Sexual activity: Yes  Other Topics Concern  . Not on file  Social History Narrative  . Not on file    Allergies  Allergen Reactions  . Amoxicillin-Pot Clavulanate Nausea And Vomiting    (OK with Amoxicillin alone)    Outpatient Medications Prior to Visit  Medication Sig Dispense Refill  . dexlansoprazole (DEXILANT) 60 MG capsule Take 1 capsule (60 mg total) by mouth daily. 30 capsule 6  . liraglutide (VICTOZA) 18 MG/3ML SOPN Inject 0.3 mLs (1.8 mg total) into the skin daily. 3 pen 5  .  lisinopril (PRINIVIL,ZESTRIL) 20 MG tablet One tab daily 30 tablet 6  . lovastatin (MEVACOR) 20 MG tablet One tab daily 30 tablet 6  . metFORMIN (GLUCOPHAGE) 1000 MG tablet Take 1 tablet (1,000 mg total) by mouth 2 (two) times daily. 60 tablet 6  . pioglitazone (ACTOS) 15 MG tablet Take 1 tablet (15 mg total) by mouth daily. 30 tablet 1  . sertraline (ZOLOFT) 100 MG tablet TAKE ONE TABLET BY MOUTH ONCE DAILY. PATIENT TO TAKE A TOTAL OF 150 MG DAILY. 30 tablet 6  . sertraline (ZOLOFT) 50 MG tablet TAKE 1 TABLET BY MOUTH ONCE DAILY 30 tablet 5  . dicyclomine (BENTYL) 20 MG tablet Take 1 tablet (20 mg total) by mouth every 6 (six) hours as needed. (Patient not taking: Reported on  04/24/2017) 30 tablet 0  . pantoprazole (PROTONIX) 40 MG tablet Take 1 tablet (40 mg total) daily by mouth. (Patient taking differently: Take 40 mg by mouth daily. Dr Allen Norris) 30 tablet 3   No facility-administered medications prior to visit.     Review of Systems  Constitutional: Negative for chills, diaphoresis, fever, malaise/fatigue and weight loss.  HENT: Positive for congestion, ear pain, sinus pressure and sore throat. Negative for ear discharge, hoarse voice and sneezing.   Eyes: Negative for blurred vision.  Respiratory: Negative for cough, sputum production, shortness of breath and wheezing.   Cardiovascular: Negative for chest pain, palpitations and leg swelling.  Gastrointestinal: Negative for abdominal pain, blood in stool, constipation, diarrhea, heartburn, melena and nausea.  Genitourinary: Negative for dysuria, frequency, hematuria and urgency.  Musculoskeletal: Negative for back pain, joint pain, myalgias and neck pain.  Skin: Negative for rash.  Neurological: Negative for dizziness, tingling, sensory change, focal weakness and headaches.  Endo/Heme/Allergies: Negative for environmental allergies and polydipsia. Does not bruise/bleed easily.  Psychiatric/Behavioral: Negative for depression and suicidal ideas. The patient is not nervous/anxious and does not have insomnia.      Objective  Vitals:   05/29/17 1507  BP: 138/80  Pulse: 76  Temp: 98.7 F (37.1 C)  TempSrc: Oral  Weight: 203 lb (92.1 kg)  Height: 5\' 8"  (1.727 m)    Physical Exam  Constitutional: She is well-developed, well-nourished, and in no distress. No distress.  HENT:  Head: Normocephalic and atraumatic.  Right Ear: External ear and ear canal normal. Tympanic membrane is retracted.  Left Ear: Tympanic membrane, external ear and ear canal normal.  Nose: Right sinus exhibits maxillary sinus tenderness. Left sinus exhibits maxillary sinus tenderness.  Mouth/Throat: Uvula is midline, oropharynx is  clear and moist and mucous membranes are normal.  Eyes: Conjunctivae and EOM are normal. Pupils are equal, round, and reactive to light. Right eye exhibits no discharge. Left eye exhibits no discharge.  Neck: Normal range of motion. Neck supple. No JVD present. No thyromegaly present.  Cardiovascular: Normal rate, regular rhythm, normal heart sounds and intact distal pulses. Exam reveals no gallop and no friction rub.  No murmur heard. Pulmonary/Chest: Effort normal and breath sounds normal. She has no wheezes. She has no rales.  Abdominal: Soft. Bowel sounds are normal. She exhibits no mass. There is no tenderness. There is no guarding.  Musculoskeletal: Normal range of motion. She exhibits no edema.  Lymphadenopathy:       Head (right side): Submandibular adenopathy present.       Head (left side): Submandibular adenopathy present.    She has no cervical adenopathy.    She has no axillary adenopathy.  Neurological: She is alert.  Skin: Skin is warm and dry. She is not diaphoretic.  Psychiatric: Mood and affect normal.  Nursing note and vitals reviewed.     Assessment & Plan  Problem List Items Addressed This Visit    None    Visit Diagnoses    Acute maxillary sinusitis, recurrence not specified    -  Primary   Relevant Medications   azithromycin (ZITHROMAX) 250 MG tablet      Meds ordered this encounter  Medications  . azithromycin (ZITHROMAX) 250 MG tablet    Sig: 2 today then 1 a day for 4 days    Dispense:  6 tablet    Refill:  0      Dr. [facility]  05/29/17

## 2017-06-08 ENCOUNTER — Other Ambulatory Visit: Payer: Self-pay | Admitting: Family Medicine

## 2017-06-08 DIAGNOSIS — F3341 Major depressive disorder, recurrent, in partial remission: Secondary | ICD-10-CM

## 2017-08-06 ENCOUNTER — Encounter: Payer: Self-pay | Admitting: Family Medicine

## 2017-08-06 ENCOUNTER — Ambulatory Visit: Payer: BC Managed Care – PPO | Admitting: Family Medicine

## 2017-08-06 VITALS — BP 120/80 | HR 100 | Ht 68.0 in | Wt 200.0 lb

## 2017-08-06 DIAGNOSIS — J0100 Acute maxillary sinusitis, unspecified: Secondary | ICD-10-CM

## 2017-08-06 DIAGNOSIS — J029 Acute pharyngitis, unspecified: Secondary | ICD-10-CM | POA: Diagnosis not present

## 2017-08-06 MED ORDER — AZITHROMYCIN 250 MG PO TABS: ORAL_TABLET | ORAL | 0 refills | Status: DC

## 2017-08-06 MED ORDER — GUAIFENESIN-CODEINE 100-10 MG/5ML PO SYRP: 5.0000 mL | ORAL_SOLUTION | Freq: Three times a day (TID) | ORAL | 0 refills | Status: DC | PRN

## 2017-08-06 NOTE — Progress Notes (Signed)
Name: Betty Walters   MRN: [id number]    DOB: 1961/01/10   Date:08/06/2017       Progress Note  Subjective  Chief Complaint  Chief Complaint  Patient presents with  . Sinusitis    cough and cong- yellowish production. Taking Delsym for cough- not helping    Sinusitis  This is a new problem. The current episode started in the past 7 days. The problem has been gradually worsening since onset. There has been no fever. She is experiencing no pain. Associated symptoms include chills, congestion, coughing, diaphoresis, ear pain, headaches, a hoarse voice, sinus pressure, a sore throat and swollen glands. Pertinent negatives include no neck pain, shortness of breath or sneezing. Past treatments include oral decongestants (delsym). The treatment provided no relief.    No problem-specific Assessment & Plan notes found for this encounter.   Past Medical History:  Diagnosis Date  . Concussion May 2016  . Depression   . Diabetes mellitus without complication ([place])   . Family history of adverse reaction to anesthesia    Father - slow to wake  . GERD (gastroesophageal reflux disease)   . High cholesterol   . Hypertension   . Yeast infection     Past Surgical History:  Procedure Laterality Date  . CESAREAN SECTION    . COLONOSCOPY WITH PROPOFOL N/A 04/11/2017   Procedure: COLONOSCOPY WITH colon;  Surgeon: Lucilla Lame, MD;  Location: [facility] [facility];  Service: Endoscopy;  Laterality: N/A;  . ESOPHAGOGASTRODUODENOSCOPY (EGD) WITH PROPOFOL N/A 04/11/2017   Procedure: ESOPHAGOGASTRODUODENOSCOPY (EGD) WITH PROPOFOL;  Surgeon: Lucilla Lame, MD;  Location: [facility];  Service: Endoscopy;  Laterality: N/A;  Diabetic - oral meds  . GALLBLADDER SURGERY    . KNEE SURGERY    . POLYPECTOMY  04/11/2017   Procedure: POLYPECTOMY INTESTINAL;  Surgeon: Lucilla Lame, MD;  Location: [facility];  Service: Endoscopy;;  . WRIST SURGERY      Family History  Problem Relation Age of Onset   . Cancer Mother   . Diabetes Father     Social History   Socioeconomic History  . Marital status: Married    Spouse name: Not on file  . Number of children: Not on file  . Years of education: Not on file  . Highest education level: Not on file  Occupational History  . Not on file  Social Needs  . Financial resource strain: Not on file  . Food insecurity:    Worry: Not on file    Inability: Not on file  . Transportation needs:    Medical: Not on file    Non-medical: Not on file  Tobacco Use  . Smoking status: Never Smoker  . Smokeless tobacco: Never Used  Substance and Sexual Activity  . Alcohol use: Yes    Alcohol/week: 0.0 oz    Comment: rare - Holidays  . Drug use: No  . Sexual activity: Yes  Lifestyle  . Physical activity:    Days per week: Not on file    Minutes per session: Not on file  . Stress: Not on file  Relationships  . Social connections:    Talks on phone: Not on file    Gets together: Not on file    Attends religious service: Not on file    Active member of club or organization: Not on file    Attends meetings of clubs or organizations: Not on file    Relationship status: Not on file  . Intimate partner  violence:    Fear of current or ex partner: Not on file    Emotionally abused: Not on file    Physically abused: Not on file    Forced sexual activity: Not on file  Other Topics Concern  . Not on file  Social History Narrative  . Not on file    Allergies  Allergen Reactions  . Amoxicillin-Pot Clavulanate Nausea And Vomiting    (OK with Amoxicillin alone)    Outpatient Medications Prior to Visit  Medication Sig Dispense Refill  . dexlansoprazole (DEXILANT) 60 MG capsule Take 1 capsule (60 mg total) by mouth daily. 30 capsule 6  . liraglutide (VICTOZA) 18 MG/3ML SOPN Inject 0.3 mLs (1.8 mg total) into the skin daily. 3 pen 5  . lisinopril (PRINIVIL,ZESTRIL) 20 MG tablet One tab daily 30 tablet 6  . lovastatin (MEVACOR) 20 MG tablet One  tab daily 30 tablet 6  . metFORMIN (GLUCOPHAGE) 1000 MG tablet Take 1 tablet (1,000 mg total) by mouth 2 (two) times daily. 60 tablet 6  . pioglitazone (ACTOS) 15 MG tablet Take 1 tablet (15 mg total) by mouth daily. 30 tablet 1  . sertraline (ZOLOFT) 100 MG tablet TAKE 1 TABLET BY MOUTH ONCE DAILY. PATIENT TO TAKE A TOTAL OF 150 MG DAILY. 30 tablet 6  . sertraline (ZOLOFT) 50 MG tablet TAKE 1 TABLET BY MOUTH ONCE DAILY 30 tablet 5  . dicyclomine (BENTYL) 20 MG tablet Take 1 tablet (20 mg total) by mouth every 6 (six) hours as needed. (Patient not taking: Reported on 04/24/2017) 30 tablet 0  . azithromycin (ZITHROMAX) 250 MG tablet 2 today then 1 a day for 4 days 6 tablet 0   No facility-administered medications prior to visit.     Review of Systems  Constitutional: Positive for chills and diaphoresis. Negative for fever, malaise/fatigue and weight loss.  HENT: Positive for congestion, ear pain, hoarse voice, sinus pressure and sore throat. Negative for ear discharge and sneezing.   Eyes: Negative for blurred vision.  Respiratory: Positive for cough. Negative for sputum production, shortness of breath and wheezing.   Cardiovascular: Negative for chest pain, palpitations and leg swelling.  Gastrointestinal: Negative for abdominal pain, blood in stool, constipation, diarrhea, heartburn, melena and nausea.  Genitourinary: Negative for dysuria, frequency, hematuria and urgency.  Musculoskeletal: Negative for back pain, joint pain, myalgias and neck pain.  Skin: Negative for rash.  Neurological: Positive for headaches. Negative for dizziness, tingling, sensory change and focal weakness.  Endo/Heme/Allergies: Negative for environmental allergies and polydipsia. Does not bruise/bleed easily.  Psychiatric/Behavioral: Negative for depression and suicidal ideas. The patient is not nervous/anxious and does not have insomnia.      Objective  Vitals:   08/06/17 1119  BP: 120/80  Pulse: 100   Weight: 200 lb (90.7 kg)  Height: 5\' 8"  (1.727 m)    Physical Exam  Constitutional: She is oriented to person, place, and time. She appears well-developed and well-nourished.  HENT:  Head: Normocephalic.  Right Ear: Hearing, tympanic membrane, external ear and ear canal normal.  Left Ear: Hearing, tympanic membrane, external ear and ear canal normal.  Nose: Right sinus exhibits maxillary sinus tenderness. Right sinus exhibits no frontal sinus tenderness. Left sinus exhibits maxillary sinus tenderness. Left sinus exhibits no frontal sinus tenderness.  Mouth/Throat: Posterior oropharyngeal erythema present. No oropharyngeal exudate or posterior oropharyngeal edema.  Eyes: Pupils are equal, round, and reactive to light. Conjunctivae and EOM are normal. Lids are everted and swept, no foreign bodies found.  Left eye exhibits no hordeolum. No foreign body present in the left eye. Right conjunctiva is not injected. Left conjunctiva is not injected. No scleral icterus.  Neck: Normal range of motion. Neck supple. No JVD present. No tracheal deviation present. No thyromegaly present.  Cardiovascular: Normal rate, regular rhythm, normal heart sounds and intact distal pulses. Exam reveals no gallop and no friction rub.  No murmur heard. Pulmonary/Chest: Effort normal and breath sounds normal. No respiratory distress. She has no wheezes. She has no rales.  Abdominal: Soft. Bowel sounds are normal. She exhibits no mass. There is no hepatosplenomegaly. There is no tenderness. There is no rebound and no guarding.  Musculoskeletal: Normal range of motion. She exhibits no edema or tenderness.  Lymphadenopathy:    She has no cervical adenopathy.  Neurological: She is alert and oriented to person, place, and time. She has normal strength. She displays normal reflexes. No cranial nerve deficit.  Skin: Skin is warm. No rash noted.  Psychiatric: She has a normal mood and affect. Her mood appears not anxious. She  does not exhibit a depressed mood.  Nursing note and vitals reviewed.     Assessment & Plan  Problem List Items Addressed This Visit    None    Visit Diagnoses    Acute maxillary sinusitis, recurrence not specified    -  Primary   Relevant Medications   azithromycin (ZITHROMAX) 250 MG tablet   guaiFENesin-codeine (ROBITUSSIN AC) 100-10 MG/5ML syrup   Pharyngitis, unspecified etiology          Meds ordered this encounter  Medications  . azithromycin (ZITHROMAX) 250 MG tablet    Sig: 2 today then 1 a day for 4 days    Dispense:  6 tablet    Refill:  0  . guaiFENesin-codeine (ROBITUSSIN AC) 100-10 MG/5ML syrup    Sig: Take 5 mLs by mouth 3 (three) times daily as needed for cough.    Dispense:  150 mL    Refill:  0      Dr. Mac[facility]  08/06/17

## 2017-08-07 ENCOUNTER — Telehealth: Payer: Self-pay | Admitting: Gastroenterology

## 2017-08-07 NOTE — Telephone Encounter (Signed)
Pt is coming by today to pick up Samples for dexalant and probiotic

## 2017-08-11 ENCOUNTER — Other Ambulatory Visit: Payer: Self-pay | Admitting: Family Medicine

## 2017-08-11 DIAGNOSIS — E119 Type 2 diabetes mellitus without complications: Secondary | ICD-10-CM

## 2017-08-18 ENCOUNTER — Other Ambulatory Visit: Payer: Self-pay

## 2017-08-18 ENCOUNTER — Telehealth: Payer: Self-pay

## 2017-08-18 MED ORDER — DOXYCYCLINE HYCLATE 100 MG PO TABS: 100.0000 mg | ORAL_TABLET | Freq: Two times a day (BID) | ORAL | 0 refills | Status: DC

## 2017-08-18 NOTE — Telephone Encounter (Signed)
Pt called in today- no better/ yellow production and cong- sent in Doxy. Need to see if not better after this round of antibiotic

## 2017-10-03 ENCOUNTER — Encounter: Payer: Self-pay | Admitting: Family Medicine

## 2017-10-03 ENCOUNTER — Ambulatory Visit
Admission: RE | Admit: 2017-10-03 | Discharge: 2017-10-03 | Disposition: A | Discharge status: Home / Self Care | Payer: BC Managed Care – PPO | Source: Ambulatory Visit | Attending: Family Medicine | Admitting: Family Medicine

## 2017-10-03 ENCOUNTER — Ambulatory Visit (INDEPENDENT_AMBULATORY_CARE_PROVIDER_SITE_OTHER): Payer: BC Managed Care – PPO | Admitting: Family Medicine

## 2017-10-03 VITALS — BP 120/64 | HR 72 | Ht 68.0 in | Wt 210.0 lb

## 2017-10-03 DIAGNOSIS — S60512A Abrasion of left hand, initial encounter: Secondary | ICD-10-CM

## 2017-10-03 DIAGNOSIS — W19XXXA Unspecified fall, initial encounter: Secondary | ICD-10-CM | POA: Insufficient documentation

## 2017-10-03 DIAGNOSIS — S0012XA Contusion of left eyelid and periocular area, initial encounter: Secondary | ICD-10-CM | POA: Diagnosis not present

## 2017-10-03 DIAGNOSIS — N342 Other urethritis: Secondary | ICD-10-CM

## 2017-10-03 DIAGNOSIS — S80812A Abrasion, left lower leg, initial encounter: Secondary | ICD-10-CM | POA: Diagnosis not present

## 2017-10-03 DIAGNOSIS — S20212A Contusion of left front wall of thorax, initial encounter: Secondary | ICD-10-CM

## 2017-10-03 LAB — POCT URINALYSIS DIPSTICK
Bilirubin, UA: NEGATIVE
Glucose, UA: NEGATIVE
Ketones, UA: NEGATIVE
LEUKOCYTES UA: NEGATIVE
NITRITE UA: NEGATIVE
PH UA: 6 (ref 5.0–8.0)
PROTEIN UA: NEGATIVE
RBC UA: NEGATIVE
SPEC GRAV UA: 1.02 (ref 1.010–1.025)
UROBILINOGEN UA: 0.2 U/dL

## 2017-10-03 MED ORDER — CEPHALEXIN 500 MG PO CAPS: 500.0000 mg | ORAL_CAPSULE | Freq: Four times a day (QID) | ORAL | 0 refills | Status: DC

## 2017-10-03 MED ORDER — FLUCONAZOLE 150 MG PO TABS: 150.0000 mg | ORAL_TABLET | Freq: Once | ORAL | 0 refills | Status: AC

## 2017-10-03 MED ORDER — TRAMADOL HCL 50 MG PO TABS: 50.0000 mg | ORAL_TABLET | Freq: Three times a day (TID) | ORAL | 0 refills | Status: DC | PRN

## 2017-10-03 NOTE — Progress Notes (Signed)
Name: Betty Walters   MRN: [id number]    DOB: 10-07-1960   Date:10/03/2017       Progress Note  Subjective  Chief Complaint  Chief Complaint  Patient presents with  . Fall    pt fell in parking lot- thinks sandals may have caused it. Hit L) side of face/eye on concrete. Sore on L) side of body. Scarped L) knee and pinky finger  . Urinary Tract Infection    pressure in vagina/ urine frequency    Fall  The accident occurred less than 1 hour ago (?uneven sidewalk). The fall occurred while walking. She landed on concrete. The volume of blood lost was minimal. The point of impact was the head, left wrist and left knee (torso). Pain location: torso/ left MP joint. The pain is at a severity of 3/10. The pain is mild. Pertinent negatives include no abdominal pain, fever, headaches, hematuria, nausea, tingling or vomiting.  Urinary Tract Infection   This is a new problem. The current episode started yesterday. The problem occurs intermittently. The problem has been gradually worsening. The quality of the pain is described as burning. The pain is at a severity of 4/10. The pain is mild. There has been no fever. Associated symptoms include frequency and urgency. Pertinent negatives include no chills, discharge, flank pain, hematuria, hesitancy, nausea, sweats or vomiting. There is no history of recurrent UTIs.    No problem-specific Assessment & Plan notes found for this encounter.   Past Medical History:  Diagnosis Date  . Concussion May 2016  . Depression   . Diabetes mellitus without complication ([place])   . Family history of adverse reaction to anesthesia    Father - slow to wake  . GERD (gastroesophageal reflux disease)   . High cholesterol   . Hypertension   . Yeast infection     Past Surgical History:  Procedure Laterality Date  . CESAREAN SECTION    . COLONOSCOPY WITH PROPOFOL N/A 04/11/2017   Procedure: COLONOSCOPY WITH colon;  Surgeon: Lucilla Lame, MD;  Location: [facility];  Service: Endoscopy;  Laterality: N/A;  . ESOPHAGOGASTRODUODENOSCOPY (EGD) WITH PROPOFOL N/A 04/11/2017   Procedure: ESOPHAGOGASTRODUODENOSCOPY (EGD) WITH PROPOFOL;  Surgeon: Lucilla Lame, MD;  Location: [facility];  Service: Endoscopy;  Laterality: N/A;  Diabetic - oral meds  . GALLBLADDER SURGERY    . KNEE SURGERY    . POLYPECTOMY  04/11/2017   Procedure: POLYPECTOMY INTESTINAL;  Surgeon: Lucilla Lame, MD;  Location: [facility];  Service: Endoscopy;;  . WRIST SURGERY      Family History  Problem Relation Age of Onset  . Cancer Mother   . Diabetes Father     Social History   Socioeconomic History  . Marital status: Married    Spouse name: Not on file  . Number of children: Not on file  . Years of education: Not on file  . Highest education level: Not on file  Occupational History  . Not on file  Social Needs  . Financial resource strain: Not on file  . Food insecurity:    Worry: Not on file    Inability: Not on file  . Transportation needs:    Medical: Not on file    Non-medical: Not on file  Tobacco Use  . Smoking status: Never Smoker  . Smokeless tobacco: Never Used  Substance and Sexual Activity  . Alcohol use: Yes    Alcohol/week: 0.0 oz    Comment: rare - Holidays  .  Drug use: No  . Sexual activity: Yes  Lifestyle  . Physical activity:    Days per week: Not on file    Minutes per session: Not on file  . Stress: Not on file  Relationships  . Social connections:    Talks on phone: Not on file    Gets together: Not on file    Attends religious service: Not on file    Active member of club or organization: Not on file    Attends meetings of clubs or organizations: Not on file    Relationship status: Not on file  . Intimate partner violence:    Fear of current or ex partner: Not on file    Emotionally abused: Not on file    Physically abused: Not on file    Forced sexual activity: Not on file  Other Topics Concern  . Not on file    Social History Narrative  . Not on file    Allergies  Allergen Reactions  . Amoxicillin-Pot Clavulanate Nausea And Vomiting    (OK with Amoxicillin alone)    Outpatient Medications Prior to Visit  Medication Sig Dispense Refill  . dexlansoprazole (DEXILANT) 60 MG capsule Take 1 capsule (60 mg total) by mouth daily. 30 capsule 6  . dicyclomine (BENTYL) 20 MG tablet Take 1 tablet (20 mg total) by mouth every 6 (six) hours as needed. (Patient not taking: Reported on 04/24/2017) 30 tablet 0  . doxycycline (VIBRA-TABS) 100 MG tablet Take 1 tablet (100 mg total) by mouth 2 (two) times daily. 20 tablet 0  . guaiFENesin-codeine (ROBITUSSIN AC) 100-10 MG/5ML syrup Take 5 mLs by mouth 3 (three) times daily as needed for cough. 150 mL 0  . liraglutide (VICTOZA) 18 MG/3ML SOPN Inject 0.3 mLs (1.8 mg total) into the skin daily. 3 pen 5  . lisinopril (PRINIVIL,ZESTRIL) 20 MG tablet One tab daily 30 tablet 6  . lovastatin (MEVACOR) 20 MG tablet One tab daily 30 tablet 6  . metFORMIN (GLUCOPHAGE) 1000 MG tablet Take 1 tablet (1,000 mg total) by mouth 2 (two) times daily. 60 tablet 6  . pioglitazone (ACTOS) 15 MG tablet TAKE 1 TABLET BY MOUTH ONCE DAILY 30 tablet 1  . sertraline (ZOLOFT) 100 MG tablet TAKE 1 TABLET BY MOUTH ONCE DAILY. PATIENT TO TAKE A TOTAL OF 150 MG DAILY. 30 tablet 6  . sertraline (ZOLOFT) 50 MG tablet TAKE 1 TABLET BY MOUTH ONCE DAILY 30 tablet 5   No facility-administered medications prior to visit.     Review of Systems  Constitutional: Negative for chills, fever, malaise/fatigue and weight loss.  HENT: Negative for ear discharge, ear pain and sore throat.   Eyes: Negative for blurred vision.  Respiratory: Negative for cough, sputum production, shortness of breath and wheezing.   Cardiovascular: Negative for chest pain, palpitations and leg swelling.  Gastrointestinal: Negative for abdominal pain, blood in stool, constipation, diarrhea, heartburn, melena, nausea and vomiting.   Genitourinary: Positive for frequency and urgency. Negative for dysuria, flank pain, hematuria and hesitancy.  Musculoskeletal: Negative for back pain, joint pain, myalgias and neck pain.  Skin: Negative for rash.  Neurological: Negative for dizziness, tingling, sensory change, focal weakness and headaches.  Endo/Heme/Allergies: Negative for environmental allergies and polydipsia. Does not bruise/bleed easily.  Psychiatric/Behavioral: Negative for depression and suicidal ideas. The patient is not nervous/anxious and does not have insomnia.      Objective  Vitals:   10/03/17 1050  BP: 120/64  Pulse: 72  Weight: 210 lb (  95.3 kg)  Height: 5\' 8"  (1.727 m)    Physical Exam  Constitutional: She is oriented to person, place, and time. She appears well-developed and well-nourished. No distress.  HENT:  Head: Normocephalic. Head is with abrasion.    Right Ear: External ear normal.  Left Ear: External ear normal.  Nose: Nose normal.  Mouth/Throat: Oropharynx is clear and moist.  Abrasion left periorbital  Eyes: Pupils are equal, round, and reactive to light. Conjunctivae and EOM are normal. Lids are everted and swept, no foreign bodies found. Right eye exhibits no discharge. Left eye exhibits no discharge and no hordeolum. No foreign body present in the left eye. Right conjunctiva is not injected. Left conjunctiva is not injected. No scleral icterus.  Neck: Normal range of motion. Neck supple. No JVD present. No tracheal deviation present. No thyromegaly present.  Cardiovascular: Normal rate, regular rhythm, normal heart sounds and intact distal pulses. Exam reveals no gallop and no friction rub.  No murmur heard. Pulmonary/Chest: Effort normal and breath sounds normal. No respiratory distress. She has no wheezes. She has no rales. She exhibits tenderness.  Tender left 3rd/4th lateral ribs and intercostal space  Abdominal: Soft. Bowel sounds are normal. She exhibits no mass. There is no  hepatosplenomegaly. There is no tenderness. There is no rebound and no guarding.  Musculoskeletal: Normal range of motion. She exhibits no edema.       Left hand: She exhibits tenderness and bony tenderness.       Hands:      Left lower leg: She exhibits tenderness.  Tender lateral left pip/MP /abrasion tender left tibia/   Lymphadenopathy:    She has no cervical adenopathy.  Neurological: She is alert and oriented to person, place, and time. She has normal strength and normal reflexes. She displays normal reflexes. No cranial nerve deficit or sensory deficit.  Reflex Scores:      Tricep reflexes are 2+ on the right side and 2+ on the left side.      Bicep reflexes are 2+ on the right side and 2+ on the left side.      Brachioradialis reflexes are 2+ on the right side and 2+ on the left side.      Patellar reflexes are 2+ on the right side and 2+ on the left side.      Achilles reflexes are 2+ on the right side and 2+ on the left side. Skin: Skin is warm and dry. No rash noted. She is not diaphoretic.  Psychiatric: She has a normal mood and affect. Her mood appears not anxious. She does not exhibit a depressed mood.  Nursing note and vitals reviewed.     Assessment & Plan  Problem List Items Addressed This Visit    None    Visit Diagnoses    Urethritis    -  Primary   urine was negative for blood or leuks. Treated with Keflex and diflucan   Relevant Medications   fluconazole (DIFLUCAN) 150 MG tablet   cephALEXin (KEFLEX) 500 MG capsule   Other Relevant Orders   POCT Urinalysis Dipstick (Completed)   Fall, initial encounter       xray of L) hand ordered. Cleaned face and finger/ dressing applied with antibacterial ointment and bandages   Relevant Orders   DG Hand Complete Left (Completed)   Contusion of left periocular region, initial encounter       dressing applied containing antibacterial ointment and bandage after cleaning area   Contusion of rib  on left side, initial  encounter       prescribed Tramadol for severe pain/ otherwise take otc med   Relevant Medications   traMADol (ULTRAM) 50 MG tablet   Abrasion, lower leg, anterior, left, initial encounter       prescribed Tramadol for severe pain/ otherwise take otc med for pain   Abrasion of left hand, initial encounter       xray of L) hand ordered. Cleaned abrasion on finger and then applied dressing with antibacterial ointment   Relevant Medications   traMADol (ULTRAM) 50 MG tablet   Other Relevant Orders   DG Hand Complete Left (Completed)      Meds ordered this encounter  Medications  . fluconazole (DIFLUCAN) 150 MG tablet    Sig: Take 1 tablet (150 mg total) by mouth once for 1 dose.    Dispense:  1 tablet    Refill:  0  . cephALEXin (KEFLEX) 500 MG capsule    Sig: Take 1 capsule (500 mg total) by mouth 4 (four) times daily.    Dispense:  6 capsule    Refill:  0  . traMADol (ULTRAM) 50 MG tablet    Sig: Take 1 tablet (50 mg total) by mouth every 8 (eight) hours as needed.    Dispense:  15 tablet    Refill:  0      Dr. Deborah [facility]  10/03/17

## 2017-10-14 ENCOUNTER — Other Ambulatory Visit: Payer: Self-pay | Admitting: Family Medicine

## 2017-10-14 DIAGNOSIS — E119 Type 2 diabetes mellitus without complications: Secondary | ICD-10-CM

## 2017-10-22 ENCOUNTER — Ambulatory Visit: Payer: Self-pay | Admitting: Podiatry

## 2017-10-27 ENCOUNTER — Encounter: Payer: Self-pay | Admitting: Podiatry

## 2017-10-27 ENCOUNTER — Ambulatory Visit (INDEPENDENT_AMBULATORY_CARE_PROVIDER_SITE_OTHER): Payer: Worker's Compensation | Admitting: Podiatry

## 2017-10-27 ENCOUNTER — Telehealth: Payer: Self-pay | Admitting: Podiatry

## 2017-10-27 DIAGNOSIS — M779 Enthesopathy, unspecified: Secondary | ICD-10-CM

## 2017-10-27 DIAGNOSIS — M778 Other enthesopathies, not elsewhere classified: Secondary | ICD-10-CM

## 2017-10-27 NOTE — Telephone Encounter (Signed)
This is [name] with [facility]. I'm calling about a workers' compensation referral I received from Dr. Milinda Pointer on Betty Walters. We received a request to schedule her for an MRI of the Left Foot without contrast. However, we are in need of the doctor's order. Please fax that over to [facility] at [phone]. Also, if that can please be faxed over to the provider because this patient is already scheduled for her exam. She's scheduled at [facility] and their fax number is [phone]. If you have any questions, please call us back at [facility] at [phone] and when you call in please provide the pt's reference number which is [id number]. Thank you.

## 2017-10-27 NOTE — Progress Notes (Signed)
She presents today for follow-up of her painful second metatarsal phalangeal joint of her left foot.  She states that is just becoming painful again.  She says I am just getting to the point I cannot stand it anymore.  States that the orthotics really are not helping.  Objective: Vital signs are stable alert and oriented x3.  Pulses are palpable.  Severe pain on palpation second metatarsophalangeal joint and on end range of motion.  Assessment: Capsulitis plantar fascial plate tear second metatarsophalangeal joint left foot.  Plan: Discussed etiology pathology conservative or surgical therapies at this point time we are going to go ahead and get an MRI of the left forefoot.  This will be for surgical repair of the second metatarsal phalangeal joint.

## 2017-11-03 NOTE — Telephone Encounter (Signed)
Order has been faxed to [facility]

## 2017-11-14 ENCOUNTER — Other Ambulatory Visit: Payer: Self-pay | Admitting: Family Medicine

## 2017-11-14 DIAGNOSIS — E119 Type 2 diabetes mellitus without complications: Secondary | ICD-10-CM

## 2017-11-24 ENCOUNTER — Telehealth: Payer: Self-pay | Admitting: Family Medicine

## 2017-11-26 ENCOUNTER — Ambulatory Visit: Payer: Self-pay | Admitting: Podiatry

## 2017-11-26 ENCOUNTER — Telehealth: Payer: Self-pay | Admitting: Podiatry

## 2017-11-26 NOTE — Telephone Encounter (Signed)
Returned call and left message to call back. 

## 2017-11-26 NOTE — Telephone Encounter (Signed)
Pt has called the office again stating that she wants to know why here case worker knows her MRI results and she doesn't. Please give her a call

## 2017-11-26 NOTE — Telephone Encounter (Signed)
This is Betty Walters had called me a few minutes ago, but I missed the call. If she could call me back at [phone]. Thanks.

## 2017-11-26 NOTE — Telephone Encounter (Signed)
Please call patient she is in a lot of pain and she wants to know what to do until her MRI results come back.

## 2017-11-27 ENCOUNTER — Other Ambulatory Visit: Payer: Self-pay | Admitting: Family Medicine

## 2017-11-27 DIAGNOSIS — E119 Type 2 diabetes mellitus without complications: Secondary | ICD-10-CM

## 2017-11-27 DIAGNOSIS — F3341 Major depressive disorder, recurrent, in partial remission: Secondary | ICD-10-CM

## 2017-11-27 NOTE — Telephone Encounter (Signed)
I returned patient call and informed her that Dr. Milinda Pointer is sending MRI disc for overread and that she can drop off disc at front desk.  I informed her that we will call her once overread is back.   She verbalized understanding

## 2017-12-08 ENCOUNTER — Telehealth: Payer: Self-pay | Admitting: *Deleted

## 2017-12-08 NOTE — Telephone Encounter (Signed)
[facility] states they do not have a MRI disc for this pt.

## 2017-12-08 NOTE — Telephone Encounter (Signed)
Patient never brought MRI disc into office.  I called patient and left message for patient to call back and let me know if and when she will bring disc by so I can sent to [facility]

## 2017-12-08 NOTE — Telephone Encounter (Signed)
Patient dropped off disc, it has been mailed to [employer].

## 2017-12-08 NOTE — Telephone Encounter (Signed)
Valetta Close - Case manager asked if the overread results were available for pt's appt.

## 2017-12-10 ENCOUNTER — Ambulatory Visit: Payer: Self-pay | Admitting: Podiatry

## 2017-12-11 ENCOUNTER — Telehealth: Payer: Self-pay | Admitting: *Deleted

## 2017-12-11 NOTE — Telephone Encounter (Signed)
[facility] states she has received MRI of pt, looks to have been mailed out 12/08/2017 and there is no paperwork in the envelope. I told Gabriel Cirri I would send clinicals and completed [facility] form. Faxed [employer] completed form and LOV to [employer].

## 2017-12-15 ENCOUNTER — Other Ambulatory Visit: Payer: Self-pay | Admitting: Family Medicine

## 2017-12-15 DIAGNOSIS — E119 Type 2 diabetes mellitus without complications: Secondary | ICD-10-CM

## 2017-12-15 DIAGNOSIS — F3341 Major depressive disorder, recurrent, in partial remission: Secondary | ICD-10-CM

## 2017-12-15 DIAGNOSIS — E782 Mixed hyperlipidemia: Secondary | ICD-10-CM

## 2017-12-15 DIAGNOSIS — I10 Essential (primary) hypertension: Secondary | ICD-10-CM

## 2017-12-22 ENCOUNTER — Ambulatory Visit: Payer: BC Managed Care – PPO | Admitting: Family Medicine

## 2017-12-22 ENCOUNTER — Encounter: Payer: Self-pay | Admitting: Family Medicine

## 2017-12-22 VITALS — BP 110/80 | HR 76 | Ht 68.0 in | Wt 213.0 lb

## 2017-12-22 DIAGNOSIS — K219 Gastro-esophageal reflux disease without esophagitis: Secondary | ICD-10-CM

## 2017-12-22 DIAGNOSIS — Z23 Encounter for immunization: Secondary | ICD-10-CM

## 2017-12-22 DIAGNOSIS — E119 Type 2 diabetes mellitus without complications: Secondary | ICD-10-CM | POA: Diagnosis not present

## 2017-12-22 DIAGNOSIS — I10 Essential (primary) hypertension: Secondary | ICD-10-CM

## 2017-12-22 DIAGNOSIS — E782 Mixed hyperlipidemia: Secondary | ICD-10-CM

## 2017-12-22 DIAGNOSIS — F3341 Major depressive disorder, recurrent, in partial remission: Secondary | ICD-10-CM | POA: Diagnosis not present

## 2017-12-22 MED ORDER — LOVASTATIN 20 MG PO TABS: ORAL_TABLET | ORAL | 1 refills | Status: DC

## 2017-12-22 MED ORDER — METFORMIN HCL 1000 MG PO TABS: 1000.0000 mg | ORAL_TABLET | Freq: Two times a day (BID) | ORAL | 1 refills | Status: DC

## 2017-12-22 MED ORDER — SERTRALINE HCL 100 MG PO TABS: ORAL_TABLET | ORAL | 1 refills | Status: DC

## 2017-12-22 MED ORDER — DEXLANSOPRAZOLE 60 MG PO CPDR: 60.0000 mg | DELAYED_RELEASE_CAPSULE | Freq: Every day | ORAL | 6 refills | Status: DC

## 2017-12-22 MED ORDER — SERTRALINE HCL 50 MG PO TABS: ORAL_TABLET | ORAL | 1 refills | Status: DC

## 2017-12-22 MED ORDER — PIOGLITAZONE HCL 15 MG PO TABS: ORAL_TABLET | ORAL | 1 refills | Status: DC

## 2017-12-22 MED ORDER — LISINOPRIL 20 MG PO TABS: ORAL_TABLET | ORAL | 1 refills | Status: DC

## 2017-12-22 MED ORDER — LIRAGLUTIDE 18 MG/3ML ~~LOC~~ SOPN: PEN_INJECTOR | SUBCUTANEOUS | 1 refills | Status: DC

## 2017-12-22 NOTE — Progress Notes (Signed)
Date:  12/22/2017   Name:  Betty Walters   DOB:  03-20-1961   MRN:  [id number]   Chief Complaint: Diabetes; Hypertension; Hyperlipidemia; Gastroesophageal Reflux; Depression; and Flu Vaccine Diabetes  She presents for her follow-up diabetic visit. She has type 2 diabetes mellitus. Her disease course has been stable. There are no hypoglycemic associated symptoms. Pertinent negatives for hypoglycemia include no confusion, dizziness, headaches, hunger, mood changes, nervousness/anxiousness, pallor, seizures, sleepiness, speech difficulty, sweats or tremors. Associated symptoms include fatigue. Pertinent negatives for diabetes include no blurred vision, no chest pain, no foot paresthesias, no foot ulcerations, no polydipsia, no polyphagia, no polyuria, no visual change, no weakness and no weight loss. There are no hypoglycemic complications. Symptoms are stable. Pertinent negatives for diabetic complications include no CVA, PVD or retinopathy. There are no known risk factors for coronary artery disease. Current diabetic treatment includes oral agent (triple therapy). She is compliant with treatment some of the time. She is following a generally unhealthy diet. She rarely participates in exercise. Her breakfast blood glucose is taken between 8-9 am. Her breakfast blood glucose range is generally 180-200 mg/dl. An ACE inhibitor/angiotensin II receptor blocker is being taken. She does not see a podiatrist.Eye exam is not current.  Hypertension  This is a chronic problem. The current episode started more than 1 year ago. The problem is unchanged. The problem is controlled. Pertinent negatives include no anxiety, blurred vision, chest pain, headaches, malaise/fatigue, neck pain, orthopnea, palpitations, peripheral edema, PND, shortness of breath or sweats. There are no associated agents to hypertension. Risk factors for coronary artery disease include stress. Past treatments include ACE inhibitors. The current  treatment provides moderate improvement. There are no compliance problems.  There is no history of angina, kidney disease, CAD/MI, CVA, heart failure, left ventricular hypertrophy, PVD or retinopathy. There is no history of chronic renal disease, a hypertension causing med or renovascular disease.  Hyperlipidemia  This is a chronic problem. The current episode started more than 1 year ago. The problem is controlled. Recent lipid tests were reviewed and are normal. Exacerbating diseases include diabetes and obesity. She has no history of chronic renal disease. Pertinent negatives include no chest pain, focal sensory loss, focal weakness, leg pain, myalgias or shortness of breath. Current antihyperlipidemic treatment includes statins.  Gastroesophageal Reflux  She reports no abdominal pain, no belching, no chest pain, no choking, no coughing, no dysphagia, no early satiety, no globus sensation, no heartburn, no hoarse voice, no nausea, no sore throat, no stridor or no wheezing. This is a chronic problem. The current episode started more than 1 year ago. The problem occurs frequently. The problem has been waxing and waning. The symptoms are aggravated by certain foods. Associated symptoms include fatigue. Pertinent negatives include no anemia, melena, muscle weakness, orthopnea or weight loss. There are no known risk factors. She has tried a PPI for the symptoms. The treatment provided moderate relief.  Depression         This is a chronic problem.  The current episode started more than 1 year ago.   Associated symptoms include fatigue, insomnia and irritable.  Associated symptoms include no decreased concentration, no helplessness, no hopelessness, no restlessness, no decreased interest, no appetite change, no body aches, no myalgias, no headaches, no indigestion and not sad.  Past treatments include SSRIs - Selective serotonin reuptake inhibitors.  Compliance with treatment is good.  Previous treatment provided  moderate relief.   Pertinent negatives include no anxiety.  Review of Systems  Constitutional: Positive for fatigue. Negative for appetite change, chills, fever, malaise/fatigue, unexpected weight change and weight loss.  HENT: Negative for congestion, ear discharge, ear pain, hoarse voice, rhinorrhea, sinus pressure, sneezing and sore throat.   Eyes: Negative for blurred vision, photophobia, pain, discharge, redness and itching.  Respiratory: Negative for cough, choking, shortness of breath, wheezing and stridor.   Cardiovascular: Negative for chest pain, palpitations, orthopnea and PND.  Gastrointestinal: Negative for abdominal pain, blood in stool, constipation, diarrhea, dysphagia, heartburn, melena, nausea and vomiting.  Endocrine: Negative for cold intolerance, heat intolerance, polydipsia, polyphagia and polyuria.  Genitourinary: Negative for dysuria, flank pain, frequency, hematuria, menstrual problem, pelvic pain, urgency, vaginal bleeding and vaginal discharge.  Musculoskeletal: Negative for arthralgias, back pain, myalgias, muscle weakness and neck pain.  Skin: Negative for pallor and rash.  Allergic/Immunologic: Negative for environmental allergies and food allergies.  Neurological: Negative for dizziness, tremors, focal weakness, seizures, speech difficulty, weakness, light-headedness, numbness and headaches.  Hematological: Negative for adenopathy. Does not bruise/bleed easily.  Psychiatric/Behavioral: Positive for depression. Negative for confusion, decreased concentration and dysphoric mood. The patient has insomnia. The patient is not nervous/anxious.     Patient Active Problem List   Diagnosis Date Noted  . Heartburn   . Dyspepsia   . Change in bowel habits   . Benign neoplasm of descending colon   . Chronic pain of toes of both feet 09/20/2016  . Type 2 diabetes mellitus with complication ([place]) 17/61/6073  . Allergic rhinitis due to pollen 06/21/2015  . Concussion  with coma 10/21/2014  . Clinical depression 09/28/2014  . Diabetes mellitus, type 2 ([place]) 09/28/2014  . HLD (hyperlipidemia) 09/28/2014  . Essential hypertension 09/28/2014  . Adiposity 09/28/2014    Allergies  Allergen Reactions  . Amoxicillin-Pot Clavulanate Nausea And Vomiting    (OK with Amoxicillin alone)    Past Surgical History:  Procedure Laterality Date  . CESAREAN SECTION    . COLONOSCOPY WITH PROPOFOL N/A 04/11/2017   Procedure: COLONOSCOPY WITH colon;  Surgeon: Lucilla Lame, MD;  Location: [facility];  Service: Endoscopy;  Laterality: N/A;  . ESOPHAGOGASTRODUODENOSCOPY (EGD) WITH PROPOFOL N/A 04/11/2017   Procedure: ESOPHAGOGASTRODUODENOSCOPY (EGD) WITH PROPOFOL;  Surgeon: Lucilla Lame, MD;  Location: [facility];  Service: Endoscopy;  Laterality: N/A;  Diabetic - oral meds  . GALLBLADDER SURGERY    . KNEE SURGERY    . POLYPECTOMY  04/11/2017   Procedure: POLYPECTOMY INTESTINAL;  Surgeon: Lucilla Lame, MD;  Location: [facility];  Service: Endoscopy;;  . WRIST SURGERY      Social History   Tobacco Use  . Smoking status: Never Smoker  . Smokeless tobacco: Never Used  Substance Use Topics  . Alcohol use: Yes    Alcohol/week: 0.0 standard drinks    Comment: rare - Holidays  . Drug use: No     Medication list has been reviewed and updated.  Current Meds  Medication Sig  . dexlansoprazole (DEXILANT) 60 MG capsule Take 1 capsule (60 mg total) by mouth daily.  Marland Kitchen liraglutide (VICTOZA) 18 MG/3ML SOPN INJECT 0.3ML (1.8MG ) INTO THE SKIN DAILY  . lisinopril (PRINIVIL,ZESTRIL) 20 MG tablet TAKE 1 TABLET BY MOUTH ONCE DAILY  . lovastatin (MEVACOR) 20 MG tablet TAKE 1 TABLET BY MOUTH ONCE DAILY  . Melatonin 3 MG TABS Take by mouth.  . metFORMIN (GLUCOPHAGE) 1000 MG tablet Take 1 tablet (1,000 mg total) by mouth 2 (two) times daily.  . pioglitazone (ACTOS) 15 MG tablet TAKE  1 TABLET BY MOUTH ONCE DAILY. APPOINTMENT NEEDED IN AUGUST FOR FURTHER  REFILLS.  Marland Kitchen sertraline (ZOLOFT) 100 MG tablet TAKE 1 TABLET BY MOUTH ONCE DAILY. PATIENT TO TAKE A TOTAL OF 150 MG DAILY.  Marland Kitchen sertraline (ZOLOFT) 50 MG tablet TAKE 1 TABLET BY MOUTH ONCE DAILY *NEED TO SCHEDULE APPOINTMENT FOR MEDICINE REFILLS*  . [DISCONTINUED] dexlansoprazole (DEXILANT) 60 MG capsule Take 1 capsule (60 mg total) by mouth daily.  . [DISCONTINUED] glipiZIDE (GLUCOTROL XL) 10 MG 24 hr tablet Take by mouth.  . [DISCONTINUED] lisinopril (PRINIVIL,ZESTRIL) 20 MG tablet TAKE 1 TABLET BY MOUTH ONCE DAILY  . [DISCONTINUED] lovastatin (MEVACOR) 20 MG tablet TAKE 1 TABLET BY MOUTH ONCE DAILY  . [DISCONTINUED] metFORMIN (GLUCOPHAGE) 1000 MG tablet Take 1 tablet (1,000 mg total) by mouth 2 (two) times daily.  . [DISCONTINUED] pioglitazone (ACTOS) 15 MG tablet TAKE 1 TABLET BY MOUTH ONCE DAILY. APPOINTMENT NEEDED IN AUGUST FOR FURTHER REFILLS.  . [DISCONTINUED] sertraline (ZOLOFT) 100 MG tablet TAKE 1 TABLET BY MOUTH ONCE DAILY. PATIENT TO TAKE A TOTAL OF 150 MG DAILY.  . [DISCONTINUED] sertraline (ZOLOFT) 50 MG tablet TAKE 1 TABLET BY MOUTH ONCE DAILY *NEED TO SCHEDULE APPOINTMENT FOR MEDICINE REFILLS*  . [DISCONTINUED] VICTOZA 18 MG/3ML SOPN INJECT 0.3ML (1.8MG ) INTO THE SKIN DAILY    PHQ 2/9 Scores 12/22/2017 04/24/2017 09/28/2014  PHQ - 2 Score 0 0 2  PHQ- 9 Score 3 2 6     Physical Exam  Constitutional: She is oriented to person, place, and time. She appears well-developed and well-nourished. She is irritable.  HENT:  Head: Normocephalic.  Right Ear: External ear normal.  Left Ear: External ear normal.  Mouth/Throat: Oropharynx is clear and moist.  Eyes: Pupils are equal, round, and reactive to light. Conjunctivae and EOM are normal. Lids are everted and swept, no foreign bodies found. Left eye exhibits no hordeolum. No foreign body present in the left eye. Right conjunctiva is not injected. Left conjunctiva is not injected. No scleral icterus.  Neck: Normal range of motion. Neck  supple. No JVD present. No tracheal deviation present. No thyromegaly present.  Cardiovascular: Normal rate, regular rhythm, normal heart sounds and intact distal pulses. Exam reveals no gallop and no friction rub.  No murmur heard. Pulmonary/Chest: Effort normal and breath sounds normal. No respiratory distress. She has no wheezes. She has no rales.  Abdominal: Soft. Bowel sounds are normal. She exhibits no mass. There is no hepatosplenomegaly. There is no tenderness. There is no rebound and no guarding.  Musculoskeletal: Normal range of motion. She exhibits no edema or tenderness.  Lymphadenopathy:    She has no cervical adenopathy.  Neurological: She is alert and oriented to person, place, and time. She has normal strength. She displays normal reflexes. No cranial nerve deficit.  Skin: Skin is warm. No rash noted.  Psychiatric: She has a normal mood and affect. Her mood appears not anxious. She does not exhibit a depressed mood.  Nursing note and vitals reviewed.   BP 110/80   Pulse 76   Ht 5\' 8"  (1.727 m)   Wt 213 lb (96.6 kg)   BMI 32.39 kg/m   Assessment and Plan: 1. Essential hypertension Chronic Controlled Continue lisinopril 20 mg. - lisinopril (PRINIVIL,ZESTRIL) 20 MG tablet; TAKE 1 TABLET BY MOUTH ONCE DAILY  Dispense: 90 tablet; Refill: 1 - Renal Function Panel  2. Mixed hyperlipidemia Chronic Controlled Continue mevacor and check lipid panel. - lovastatin (MEVACOR) 20 MG tablet; TAKE 1 TABLET BY MOUTH ONCE DAILY  Dispense: 90 tablet; Refill: 1 - Lipid panel  3. Recurrent major depressive disorder, in partial remission (HCC) Chronic Stable Cont current dosing sertraline 150 mg (100mg  + 50 mg). PHQ noted. - sertraline (ZOLOFT) 50 MG tablet; TAKE 1 TABLET BY MOUTH ONCE DAILY *NEED TO SCHEDULE APPOINTMENT FOR MEDICINE REFILLS*  Dispense: 90 tablet; Refill: 1 - sertraline (ZOLOFT) 100 MG tablet; TAKE 1 TABLET BY MOUTH ONCE DAILY. PATIENT TO TAKE A TOTAL OF 150 MG DAILY.   Dispense: 90 tablet; Refill: 1  4. Type 2 diabetes mellitus without complication, without long-term current use of insulin (HCC)  - metFORMIN (GLUCOPHAGE) 1000 MG tablet; Take 1 tablet (1,000 mg total) by mouth 2 (two) times daily.  Dispense: 180 tablet; Refill: 1 - liraglutide (VICTOZA) 18 MG/3ML SOPN; INJECT 0.3ML (1.8MG ) INTO THE SKIN DAILY  Dispense: 3 pen; Refill: 1 - pioglitazone (ACTOS) 15 MG tablet; TAKE 1 TABLET BY MOUTH ONCE DAILY. APPOINTMENT NEEDED IN AUGUST FOR FURTHER REFILLS.  Dispense: 90 tablet; Refill: 1 - Hemoglobin A1c - Renal Function Panel - Microalbumin / creatinine urine ratio  5. Gastroesophageal reflux disease, esophagitis presence not specified Chronic Controlled Continue Dexilant. - dexlansoprazole (DEXILANT) 60 MG capsule; Take 1 capsule (60 mg total) by mouth daily.  Dispense: 30 capsule; Refill: 6  6. Flu vaccine need Discussed and Administered - Flu Vaccine QUAD 6+ mos PF IM (Fluarix Quad PF)    Dr. Otilio Miu [facility]  12/22/2017

## 2017-12-23 ENCOUNTER — Encounter: Payer: Self-pay | Admitting: Podiatry

## 2017-12-23 LAB — MICROALBUMIN / CREATININE URINE RATIO
CREATININE, UR: 124.1 mg/dL
MICROALB/CREAT RATIO: 6 mg/g{creat} (ref 0.0–30.0)
MICROALBUM., U, RANDOM: 7.4 ug/mL

## 2017-12-23 LAB — LIPID PANEL
Chol/HDL Ratio: 4.6 ratio — ABNORMAL HIGH (ref 0.0–4.4)
Cholesterol, Total: 222 mg/dL — ABNORMAL HIGH (ref 100–199)
HDL: 48 mg/dL (ref >39)
LDL CALC: 138 mg/dL — AB (ref 0–99)
Triglycerides: 181 mg/dL — ABNORMAL HIGH (ref 0–149)
VLDL Cholesterol Cal: 36 mg/dL (ref 5–40)

## 2017-12-23 LAB — RENAL FUNCTION PANEL
Albumin: 4.4 g/dL (ref 3.5–5.5)
BUN/Creatinine Ratio: 24 — ABNORMAL HIGH (ref 9–23)
BUN: 17 mg/dL (ref 6–24)
CO2: 23 mmol/L (ref 20–29)
CREATININE: 0.71 mg/dL (ref 0.57–1.00)
Calcium: 9.6 mg/dL (ref 8.7–10.2)
Chloride: 101 mmol/L (ref 96–106)
GFR calc Af Amer: 112 mL/min/{1.73_m2} (ref >59)
GFR, EST NON AFRICAN AMERICAN: 97 mL/min/{1.73_m2} (ref >59)
Glucose: 168 mg/dL — ABNORMAL HIGH (ref 65–99)
Phosphorus: 3.8 mg/dL (ref 2.5–4.5)
Potassium: 4.5 mmol/L (ref 3.5–5.2)
SODIUM: 140 mmol/L (ref 134–144)

## 2017-12-23 LAB — HEMOGLOBIN A1C
ESTIMATED AVERAGE GLUCOSE: 154 mg/dL
HEMOGLOBIN A1C: 7 % — AB (ref 4.8–5.6)

## 2017-12-31 ENCOUNTER — Encounter: Payer: Self-pay | Admitting: Podiatry

## 2017-12-31 ENCOUNTER — Encounter

## 2017-12-31 ENCOUNTER — Ambulatory Visit (INDEPENDENT_AMBULATORY_CARE_PROVIDER_SITE_OTHER): Payer: Worker's Compensation | Admitting: Podiatry

## 2017-12-31 DIAGNOSIS — S99922D Unspecified injury of left foot, subsequent encounter: Secondary | ICD-10-CM | POA: Diagnosis not present

## 2017-12-31 DIAGNOSIS — M2042 Other hammer toe(s) (acquired), left foot: Secondary | ICD-10-CM | POA: Diagnosis not present

## 2017-12-31 NOTE — Patient Instructions (Signed)
Pre-Operative Instructions  Congratulations, you have decided to take an important step towards improving your quality of life.  You can be assured that the doctors and staff at [facility] will be with you every step of the way.  Here are some important things you should know:  1. Plan to be at the surgery center/hospital at least 1 (one) hour prior to your scheduled time, unless otherwise directed by the surgical center/hospital staff.  You must have a responsible adult accompany you, remain during the surgery and drive you home.  Make sure you have directions to the surgical center/hospital to ensure you arrive on time. 2. If you are having surgery at [facility] or [facility] hospitals, you will need a copy of your medical history and physical form from your family physician within one month prior to the date of surgery. We will give you a form for your primary physician to complete.  3. We make every effort to accommodate the date you request for surgery.  However, there are times where surgery dates or times have to be moved.  We will contact you as soon as possible if a change in schedule is required.   4. No aspirin/ibuprofen for one week before surgery.  If you are on aspirin, any non-steroidal anti-inflammatory medications (Mobic, Aleve, Ibuprofen) should not be taken seven (7) days prior to your surgery.  You make take Tylenol for pain prior to surgery.  5. Medications - If you are taking daily heart and blood pressure medications, seizure, reflux, allergy, asthma, anxiety, pain or diabetes medications, make sure you notify the surgery center/hospital before the day of surgery so they can tell you which medications you should take or avoid the day of surgery. 6. No food or drink after midnight the night before surgery unless directed otherwise by surgical center/hospital staff. 7. No alcoholic beverages 24-hours prior to surgery.  No smoking 24-hours prior or 24-hours after  surgery. 8. Wear loose pants or shorts. They should be loose enough to fit over bandages, boots, and casts. 9. Don't wear slip-on shoes. Sneakers are preferred. 10. Bring your boot with you to the surgery center/hospital.  Also bring crutches or a walker if your physician has prescribed it for you.  If you do not have this equipment, it will be provided for you after surgery. 11. If you have not been contacted by the surgery center/hospital by the day before your surgery, call to confirm the date and time of your surgery. 12. Leave-time from work may vary depending on the type of surgery you have.  Appropriate arrangements should be made prior to surgery with your employer. 13. Prescriptions will be provided immediately following surgery by your doctor.  Fill these as soon as possible after surgery and take the medication as directed. Pain medications will not be refilled on weekends and must be approved by the doctor. 14. Remove nail polish on the operative foot and avoid getting pedicures prior to surgery. 15. Wash the night before surgery.  The night before surgery wash the foot and leg well with water and the antibacterial soap provided. Be sure to pay special attention to beneath the toenails and in between the toes.  Wash for at least three (3) minutes. Rinse thoroughly with water and dry well with a towel.  Perform this wash unless told not to do so by your physician.  Enclosed: 1 Ice pack (please put in freezer the night before surgery)   1 Hibiclens skin cleaner     Pre-op instructions  If you have any questions regarding the instructions, please do not hesitate to call our office.  [place]: [address], [place], [place] [address] -- [phone]  [place]: [address] [place], [place] [address] -- [phone]  [place]: [address]  [place], [place] [address] -- [phone]  [place]: [address], [address], [place],  [address] -- [phone]  Website:  [email or web address] 

## 2017-12-31 NOTE — Progress Notes (Signed)
She presents today for MRI review.  States that her foot is the same if not worse.  She states that December 18 she will be having a anterior cruciate ligament replaced by Dr. Para March.  Objective: Vital signs are stable she is alert and oriented x3.  She presents with her caseworker today.  MRI states that she has a tear of the plantar plate.  At this point I feel is necessary to explained to her that this is more of a surgical option rather than a conservative therapy.  Assessment: Tear of the plantar plate with medial deviation of the second toe left.  Plan: Discussed etiology pathology conservative or surgical therapies at this point we consented her for a decompression osteotomy with me of the second metatarsal.  At this point we will realign the joint and be able to obtain tissue to repair that lateral ligament.  We will also perform a hammertoe repair because of the contraction of the toe at the second metatarsal phalangeal joint would result in recurrence of similar condition that she already has.  We consented her today for hammertoe repair and a second metatarsal osteotomy with screws and pins she understands and is amenable to it we did discuss the possible postop complications which might may include but not limited to postop pain bleeding swelling infection recurrence need further surgery overcorrection under correction loss of digit loss of limb loss of life.  I expressed to her that it would be of benefit to get this done and healed prior to having her knee surgery because I feel that carrying a 5 pound boot after a fresh knee surgery may be painful.  Also she will need physical therapy and if the surgery is performed near or about the time of the knee surgery then she will not be able to adequately rehabbed the knee.  She signed all the consent forms follow-up with her in the near future for surgery.  She will be out of work for 8 to 12 weeks at that point

## 2018-01-02 ENCOUNTER — Telehealth: Payer: Self-pay | Admitting: Podiatry

## 2018-01-02 NOTE — Telephone Encounter (Signed)
This is Betty Walters, case manager for Betty Walters. I need the medical records from her visits with Dr. Milinda Pointer starting from March 2017 - August 2019. I have them August 2019 on but I need March 2017 - August 2019 and I also need the most recent office visit note from 02 October. My fax number is [phone] and my phone number is [phone]. I'm trying to get her set up for a second opinion but I would like to move this along as quickly as possible because Dr. Milinda Pointer is recommending she have surgery by the end of October. Thank you.

## 2018-01-26 DIAGNOSIS — S93529A Sprain of metatarsophalangeal joint of unspecified toe(s), initial encounter: Secondary | ICD-10-CM | POA: Insufficient documentation

## 2018-01-26 HISTORY — DX: Sprain of metatarsophalangeal joint of unspecified toe(s), initial encounter: S93.529A

## 2018-01-27 ENCOUNTER — Telehealth: Payer: Self-pay | Admitting: Gastroenterology

## 2018-01-27 NOTE — Telephone Encounter (Signed)
PT is calling she was given samples of probiotic  She would like rx called in to [address] in pill version not powder

## 2018-01-28 NOTE — Telephone Encounter (Signed)
Pt notified VSL #3 is an OTC probiotic and not available by rx. Pt will stop by to get some samples and then check her pharmacy.

## 2018-02-10 ENCOUNTER — Other Ambulatory Visit: Payer: Self-pay

## 2018-02-12 ENCOUNTER — Other Ambulatory Visit: Payer: Self-pay

## 2018-02-12 ENCOUNTER — Telehealth: Payer: Self-pay

## 2018-02-12 MED ORDER — AZITHROMYCIN 250 MG PO TABS: ORAL_TABLET | ORAL | 0 refills | Status: DC

## 2018-02-12 NOTE — Telephone Encounter (Signed)
Pt called in wanting antibiotic sent in because daughter has a baby that she will be helping with/ can't come in today- sent in [place] use as directed- we will see if not better

## 2018-04-03 ENCOUNTER — Telehealth: Payer: Self-pay | Admitting: Gastroenterology

## 2018-04-03 NOTE — Telephone Encounter (Signed)
Pt is calling she would like to see if she can get Samples of Dexilint she states she will be out of work until February due to knee surgery and the rx cost here $30 please call pt

## 2018-04-03 NOTE — Telephone Encounter (Signed)
Will you call pt and advise her I can provide her samples for 2 weeks but I cannot supply her with 30 days worth as we do not get much of the Dexilant anymore as it is going generic this year. She can stop by [facility] or [place] for these samples.

## 2018-05-25 ENCOUNTER — Encounter: Payer: BC Managed Care – PPO | Admitting: Family Medicine

## 2018-06-02 ENCOUNTER — Other Ambulatory Visit: Payer: Self-pay | Admitting: Family Medicine

## 2018-06-02 DIAGNOSIS — E119 Type 2 diabetes mellitus without complications: Secondary | ICD-10-CM

## 2018-08-05 ENCOUNTER — Encounter: Payer: Self-pay | Admitting: Family Medicine

## 2018-08-05 ENCOUNTER — Other Ambulatory Visit: Payer: Self-pay

## 2018-08-05 ENCOUNTER — Ambulatory Visit: Payer: BC Managed Care – PPO | Admitting: Family Medicine

## 2018-08-05 VITALS — BP 130/62 | HR 68 | Ht 68.0 in | Wt 213.0 lb

## 2018-08-05 DIAGNOSIS — E782 Mixed hyperlipidemia: Secondary | ICD-10-CM

## 2018-08-05 DIAGNOSIS — E119 Type 2 diabetes mellitus without complications: Secondary | ICD-10-CM

## 2018-08-05 DIAGNOSIS — I10 Essential (primary) hypertension: Secondary | ICD-10-CM

## 2018-08-05 DIAGNOSIS — K219 Gastro-esophageal reflux disease without esophagitis: Secondary | ICD-10-CM

## 2018-08-05 DIAGNOSIS — F3341 Major depressive disorder, recurrent, in partial remission: Secondary | ICD-10-CM

## 2018-08-05 MED ORDER — SERTRALINE HCL 50 MG PO TABS: ORAL_TABLET | ORAL | 1 refills | Status: DC

## 2018-08-05 MED ORDER — LISINOPRIL 20 MG PO TABS: ORAL_TABLET | ORAL | 1 refills | Status: DC

## 2018-08-05 MED ORDER — LIRAGLUTIDE 18 MG/3ML ~~LOC~~ SOPN: PEN_INJECTOR | SUBCUTANEOUS | 0 refills | Status: DC

## 2018-08-05 MED ORDER — METFORMIN HCL 1000 MG PO TABS: 1000.0000 mg | ORAL_TABLET | Freq: Two times a day (BID) | ORAL | 1 refills | Status: DC

## 2018-08-05 MED ORDER — LOVASTATIN 20 MG PO TABS: ORAL_TABLET | ORAL | 1 refills | Status: DC

## 2018-08-05 MED ORDER — SERTRALINE HCL 100 MG PO TABS: ORAL_TABLET | ORAL | 1 refills | Status: DC

## 2018-08-05 MED ORDER — PIOGLITAZONE HCL 15 MG PO TABS: ORAL_TABLET | ORAL | 1 refills | Status: DC

## 2018-08-05 NOTE — Progress Notes (Signed)
Date:  08/05/2018   Name:  Betty Walters   DOB:  12/13/1960   MRN:  [id number]   Chief Complaint: Diabetes; Hypertension; Hyperlipidemia; and Depression (PHQ9=10)  Diabetes  She presents for her follow-up diabetic visit. She has type 2 diabetes mellitus. Her disease course has been stable. There are no hypoglycemic associated symptoms. Pertinent negatives for hypoglycemia include no dizziness, headaches, nervousness/anxiousness or sweats. There are no diabetic associated symptoms. Pertinent negatives for diabetes include no blurred vision, no chest pain, no fatigue, no foot paresthesias, no foot ulcerations, no polydipsia, no polyphagia, no polyuria, no visual change, no weakness and no weight loss. There are no hypoglycemic complications. Symptoms are stable. There are no diabetic complications. Pertinent negatives for diabetic complications include no CVA, PVD or retinopathy. Risk factors for coronary artery disease include diabetes mellitus, dyslipidemia, hypertension and obesity. She is compliant with treatment all of the time. Her weight is stable. She is following a generally healthy (eating at home) diet. Meal planning includes avoidance of concentrated sweets and carbohydrate counting. Her breakfast blood glucose is taken between 8-9 am. Her breakfast blood glucose range is generally 140-180 mg/dl. An ACE inhibitor/angiotensin II receptor blocker is being taken. Eye exam is current.  Hypertension  This is a chronic problem. The current episode started more than 1 year ago. The problem is unchanged. The problem is controlled. Pertinent negatives include no anxiety, blurred vision, chest pain, headaches, malaise/fatigue, neck pain, orthopnea, palpitations, peripheral edema, PND, shortness of breath or sweats. There are no associated agents to hypertension. Risk factors for coronary artery disease include dyslipidemia. Past treatments include ACE inhibitors. The current treatment provides moderate  improvement. There are no compliance problems.  There is no history of angina, kidney disease, CAD/MI, CVA, heart failure, left ventricular hypertrophy, PVD or retinopathy. There is no history of chronic renal disease, a hypertension causing med or renovascular disease.  Hyperlipidemia  This is a chronic problem. The current episode started more than 1 year ago. The problem is controlled. Recent lipid tests were reviewed and are normal. She has no history of chronic renal disease, diabetes, hypothyroidism, liver disease, obesity or nephrotic syndrome. There are no known factors aggravating her hyperlipidemia. Pertinent negatives include no chest pain, focal sensory loss, focal weakness, leg pain, myalgias or shortness of breath. Current antihyperlipidemic treatment includes statins. The current treatment provides no improvement of lipids. There are no compliance problems.  Risk factors for coronary artery disease include hypertension, diabetes mellitus and dyslipidemia.  Depression         This is a chronic problem.  The current episode started more than 1 year ago.   The onset quality is gradual.   The problem occurs daily.  The problem has been gradually improving since onset.  Associated symptoms include no decreased concentration, no fatigue, no helplessness, no hopelessness, does not have insomnia, not irritable, no restlessness, no decreased interest, no appetite change, no body aches, no myalgias, no headaches, not sad and no suicidal ideas.     Exacerbated by: surgical rehab.  Past treatments include SSRIs - Selective serotonin reuptake inhibitors.  Compliance with treatment is good.  Previous treatment provided mild relief.   Pertinent negatives include no hypothyroidism and no anxiety.   Review of Systems  Constitutional: Negative.  Negative for appetite change, chills, fatigue, fever, malaise/fatigue, unexpected weight change and weight loss.  HENT: Negative for congestion, ear discharge, ear  pain, rhinorrhea, sinus pressure, sneezing and sore throat.   Eyes: Negative for  blurred vision, photophobia, pain, discharge, redness and itching.  Respiratory: Negative for cough, shortness of breath, wheezing and stridor.   Cardiovascular: Negative for chest pain, palpitations, orthopnea and PND.  Gastrointestinal: Negative for abdominal pain, blood in stool, constipation, diarrhea, nausea and vomiting.  Endocrine: Negative for cold intolerance, heat intolerance, polydipsia, polyphagia and polyuria.  Genitourinary: Negative for dysuria, flank pain, frequency, hematuria, menstrual problem, pelvic pain, urgency, vaginal bleeding and vaginal discharge.  Musculoskeletal: Negative for arthralgias, back pain, myalgias and neck pain.  Skin: Negative for rash.  Allergic/Immunologic: Negative for environmental allergies and food allergies.  Neurological: Negative for dizziness, focal weakness, weakness, light-headedness, numbness and headaches.  Hematological: Negative for adenopathy. Does not bruise/bleed easily.  Psychiatric/Behavioral: Positive for depression. Negative for decreased concentration, dysphoric mood and suicidal ideas. The patient is not nervous/anxious and does not have insomnia.     Patient Active Problem List   Diagnosis Date Noted  . Heartburn   . Dyspepsia   . Change in bowel habits   . Benign neoplasm of descending colon   . Chronic pain of toes of both feet 09/20/2016  . Type 2 diabetes mellitus with complication ([place]) 31/51/7616  . Allergic rhinitis due to pollen 06/21/2015  . Concussion with coma 10/21/2014  . Clinical depression 09/28/2014  . Diabetes mellitus, type 2 ([place]) 09/28/2014  . HLD (hyperlipidemia) 09/28/2014  . Essential hypertension 09/28/2014  . Adiposity 09/28/2014    Allergies  Allergen Reactions  . Amoxicillin-Pot Clavulanate Nausea And Vomiting    (OK with Amoxicillin alone)    Past Surgical History:  Procedure Laterality Date  . CESAREAN  SECTION    . COLONOSCOPY WITH PROPOFOL N/A 04/11/2017   Procedure: COLONOSCOPY WITH colon;  Surgeon: Lucilla Lame, MD;  Location: [facility];  Service: Endoscopy;  Laterality: N/A;  . ESOPHAGOGASTRODUODENOSCOPY (EGD) WITH PROPOFOL N/A 04/11/2017   Procedure: ESOPHAGOGASTRODUODENOSCOPY (EGD) WITH PROPOFOL;  Surgeon: Lucilla Lame, MD;  Location: [facility];  Service: Endoscopy;  Laterality: N/A;  Diabetic - oral meds  . GALLBLADDER SURGERY    . KNEE SURGERY    . POLYPECTOMY  04/11/2017   Procedure: POLYPECTOMY INTESTINAL;  Surgeon: Lucilla Lame, MD;  Location: [facility];  Service: Endoscopy;;  . WRIST SURGERY      Social History   Tobacco Use  . Smoking status: Never Smoker  . Smokeless tobacco: Never Used  Substance Use Topics  . Alcohol use: Yes    Alcohol/week: 0.0 standard drinks    Comment: rare - Holidays  . Drug use: No     Medication list has been reviewed and updated.  Current Meds  Medication Sig  . celecoxib (CELEBREX) 200 MG capsule TAKE 1 CAPSULE BY MOUTH ONCE DAILY AS NEEDED  . dexlansoprazole (DEXILANT) 60 MG capsule Take 1 capsule (60 mg total) by mouth daily.  Marland Kitchen liraglutide (VICTOZA) 18 MG/3ML SOPN INJECT 1.8MG  SUBCUTANEOUSLY ONCE DAILY  . lisinopril (PRINIVIL,ZESTRIL) 20 MG tablet TAKE 1 TABLET BY MOUTH ONCE DAILY  . lovastatin (MEVACOR) 20 MG tablet TAKE 1 TABLET BY MOUTH ONCE DAILY  . Melatonin 3 MG TABS Take by mouth.  . metFORMIN (GLUCOPHAGE) 1000 MG tablet Take 1 tablet (1,000 mg total) by mouth 2 (two) times daily.  . pioglitazone (ACTOS) 15 MG tablet TAKE 1 TABLET BY MOUTH ONCE DAILY. APPOINTMENT NEEDED IN AUGUST FOR FURTHER REFILLS.  Marland Kitchen sertraline (ZOLOFT) 100 MG tablet TAKE 1 TABLET BY MOUTH ONCE DAILY. PATIENT TO TAKE A TOTAL OF 150 MG DAILY.  Marland Kitchen sertraline (ZOLOFT)  50 MG tablet TAKE 1 TABLET BY MOUTH ONCE DAILY *NEED TO SCHEDULE APPOINTMENT FOR MEDICINE REFILLS*    PHQ 2/9 Scores 08/05/2018 12/22/2017 04/24/2017 09/28/2014  PHQ -  2 Score 4 0 0 2  PHQ- 9 Score 10 3 2 6     BP Readings from Last 3 Encounters:  08/05/18 130/62  12/22/17 110/80  10/03/17 120/64    Physical Exam Vitals signs and nursing note reviewed.  Constitutional:      General: She is not irritable.She is not in acute distress.    Appearance: She is not diaphoretic.  HENT:     Head: Normocephalic and atraumatic.     Right Ear: Tympanic membrane, ear canal and external ear normal.     Left Ear: Tympanic membrane, ear canal and external ear normal.     Nose: Nose normal. No congestion or rhinorrhea.     Mouth/Throat:     Pharynx: Oropharynx is clear.  Eyes:     General:        Right eye: No discharge.        Left eye: No discharge.     Conjunctiva/sclera: Conjunctivae normal.     Pupils: Pupils are equal, round, and reactive to light.  Neck:     Musculoskeletal: Normal range of motion and neck supple.     Thyroid: No thyromegaly.     Vascular: No JVD.  Cardiovascular:     Rate and Rhythm: Normal rate and regular rhythm.     Heart sounds: Normal heart sounds. No murmur. No friction rub. No gallop.   Pulmonary:     Effort: Pulmonary effort is normal.     Breath sounds: Normal breath sounds. No wheezing, rhonchi or rales.  Chest:     Chest wall: No tenderness.  Abdominal:     General: Bowel sounds are normal.     Palpations: Abdomen is soft. There is no mass.     Tenderness: There is no abdominal tenderness. There is no right CVA tenderness, guarding or rebound.     Hernia: No hernia is present.  Musculoskeletal: Normal range of motion.  Feet:     Right foot:     Protective Sensation: 6 sites tested. 6 sites sensed.     Skin integrity: No ulcer, blister or skin breakdown.     Left foot:     Protective Sensation: 6 sites tested. 6 sites sensed.     Skin integrity: No ulcer, blister or skin breakdown.  Lymphadenopathy:     Cervical: No cervical adenopathy.  Skin:    General: Skin is warm and dry.  Neurological:     Mental  Status: She is alert.     Deep Tendon Reflexes: Reflexes are normal and symmetric.     Wt Readings from Last 3 Encounters:  08/05/18 213 lb (96.6 kg)  12/22/17 213 lb (96.6 kg)  10/03/17 210 lb (95.3 kg)    BP 130/62   Pulse 68   Ht 5\' 8"  (1.727 m)   Wt 213 lb (96.6 kg)   BMI 32.39 kg/m   Assessment and Plan: 1. Essential hypertension Chronic.  Controlled.  Continue lisinopril 20 mg once a day.  Will check a renal function panel to assess GFR. - lisinopril (ZESTRIL) 20 MG tablet; TAKE 1 TABLET BY MOUTH ONCE DAILY  Dispense: 90 tablet; Refill: 1 - Hemoglobin A1c - Renal Function Panel  2. Gastroesophageal reflux disease, esophagitis presence not specified Chronic.  Controlled.  Continue Dexilant.  3. Mixed hyperlipidemia Chronic.  Controlled.  Continue lovastatin 20 mg will check lipid panel. - lovastatin (MEVACOR) 20 MG tablet; TAKE 1 TABLET BY MOUTH ONCE DAILY  Dispense: 90 tablet; Refill: 1 - Lipid panel  4. Recurrent major depressive disorder, in partial remission (HCC) pH Q9 score 10.  Continue sertraline 150 mg once a day. - sertraline (ZOLOFT) 50 MG tablet; TAKE 1 TABLET BY MOUTH ONCE DAILY *NEED TO SCHEDULE APPOINTMENT FOR MEDICINE REFILLS*  Dispense: 90 tablet; Refill: 1 - sertraline (ZOLOFT) 100 MG tablet; TAKE 1 TABLET BY MOUTH ONCE DAILY. PATIENT TO TAKE A TOTAL OF 150 MG DAILY.  Dispense: 90 tablet; Refill: 1  5. Type 2 diabetes mellitus without complication, without long-term current use of insulin (HCC) Chronic.  Controlled.  Continue metformin 1 g twice a day Actos 15 mg once a day and Victoza 1.8 mg subcutaneously once a day.  Will check an A1c. - metFORMIN (GLUCOPHAGE) 1000 MG tablet; Take 1 tablet (1,000 mg total) by mouth 2 (two) times daily.  Dispense: 180 tablet; Refill: 1 - pioglitazone (ACTOS) 15 MG tablet; TAKE 1 TABLET BY MOUTH ONCE DAILY. APPOINTMENT NEEDED IN AUGUST FOR FURTHER REFILLS.  Dispense: 90 tablet; Refill: 1 - liraglutide (VICTOZA) 18  MG/3ML SOPN; INJECT 1.8MG  SUBCUTANEOUSLY ONCE DAILY  Dispense: 9 mL; Refill: 0 - Hemoglobin A1c - Renal Function Panel

## 2018-08-05 NOTE — Patient Instructions (Signed)
Carbohydrate Counting for Diabetes Mellitus, Adult  Carbohydrate counting is a method of keeping track of how many carbohydrates you eat. Eating carbohydrates naturally increases the amount of sugar (glucose) in the blood. Counting how many carbohydrates you eat helps keep your blood glucose within normal limits, which helps you manage your diabetes (diabetes mellitus). It is important to know how many carbohydrates you can safely have in each meal. This is different for every person. A diet and nutrition specialist (registered dietitian) can help you make a meal plan and calculate how many carbohydrates you should have at each meal and snack. Carbohydrates are found in the following foods:  Grains, such as breads and cereals.  Dried beans and soy products.  Starchy vegetables, such as potatoes, peas, and corn.  Fruit and fruit juices.  Milk and yogurt.  Sweets and snack foods, such as cake, cookies, candy, chips, and soft drinks. How do I count carbohydrates? There are two ways to count carbohydrates in food. You can use either of the methods or a combination of both. Reading "Nutrition Facts" on packaged food The "Nutrition Facts" list is included on the labels of almost all packaged foods and beverages in the [place] It includes:  The serving size.  Information about nutrients in each serving, including the grams (g) of carbohydrate per serving. To use the "Nutrition Facts":  Decide how many servings you will have.  Multiply the number of servings by the number of carbohydrates per serving.  The resulting number is the total amount of carbohydrates that you will be having. Learning standard serving sizes of other foods When you eat carbohydrate foods that are not packaged or do not include "Nutrition Facts" on the label, you need to measure the servings in order to count the amount of carbohydrates:  Measure the foods that you will eat with a food scale or measuring cup, if needed.   Decide how many standard-size servings you will eat.  Multiply the number of servings by 15. Most carbohydrate-rich foods have about 15 g of carbohydrates per serving. ? For example, if you eat 8 oz (170 g) of strawberries, you will have eaten 2 servings and 30 g of carbohydrates (2 servings x 15 g = 30 g).  For foods that have more than one food mixed, such as soups and casseroles, you must count the carbohydrates in each food that is included. The following list contains standard serving sizes of common carbohydrate-rich foods. Each of these servings has about 15 g of carbohydrates:   hamburger bun or  English muffin.   oz (15 mL) syrup.   oz (14 g) jelly.  1 slice of bread.  1 six-inch tortilla.  3 oz (85 g) cooked rice or pasta.  4 oz (113 g) cooked dried beans.  4 oz (113 g) starchy vegetable, such as peas, corn, or potatoes.  4 oz (113 g) hot cereal.  4 oz (113 g) mashed potatoes or  of a large baked potato.  4 oz (113 g) canned or frozen fruit.  4 oz (120 mL) fruit juice.  4-6 crackers.  6 chicken nuggets.  6 oz (170 g) unsweetened dry cereal.  6 oz (170 g) plain fat-free yogurt or yogurt sweetened with artificial sweeteners.  8 oz (240 mL) milk.  8 oz (170 g) fresh fruit or one small piece of fruit.  24 oz (680 g) popped popcorn. Example of carbohydrate counting Sample meal  3 oz (85 g) chicken breast.  6 oz (170 g)   brown rice.  4 oz (113 g) corn.  8 oz (240 mL) milk.  8 oz (170 g) strawberries with sugar-free whipped topping. Carbohydrate calculation 1. Identify the foods that contain carbohydrates: ? Rice. ? Corn. ? Milk. ? Strawberries. 2. Calculate how many servings you have of each food: ? 2 servings rice. ? 1 serving corn. ? 1 serving milk. ? 1 serving strawberries. 3. Multiply each number of servings by 15 g: ? 2 servings rice x 15 g = 30 g. ? 1 serving corn x 15 g = 15 g. ? 1 serving milk x 15 g = 15 g. ? 1 serving  strawberries x 15 g = 15 g. 4. Add together all of the amounts to find the total grams of carbohydrates eaten: ? 30 g + 15 g + 15 g + 15 g = 75 g of carbohydrates total. Summary  Carbohydrate counting is a method of keeping track of how many carbohydrates you eat.  Eating carbohydrates naturally increases the amount of sugar (glucose) in the blood.  Counting how many carbohydrates you eat helps keep your blood glucose within normal limits, which helps you manage your diabetes.  A diet and nutrition specialist (registered dietitian) can help you make a meal plan and calculate how many carbohydrates you should have at each meal and snack. This information is not intended to replace advice given to you by your health care provider. Make sure you discuss any questions you have with your health care provider. Document Released: 03/18/2005 Document Revised: 09/25/2016 Document Reviewed: 08/30/2015 Elsevier Interactive Patient Education  2019 [employer] Inc.  

## 2018-08-06 LAB — RENAL FUNCTION PANEL
Albumin: 4.5 g/dL (ref 3.8–4.9)
BUN/Creatinine Ratio: 22 (ref 9–23)
BUN: 18 mg/dL (ref 6–24)
CO2: 19 mmol/L — ABNORMAL LOW (ref 20–29)
Calcium: 9.8 mg/dL (ref 8.7–10.2)
Chloride: 99 mmol/L (ref 96–106)
Creatinine, Ser: 0.81 mg/dL (ref 0.57–1.00)
GFR calc Af Amer: 95 mL/min/{1.73_m2} (ref >59)
GFR calc non Af Amer: 82 mL/min/{1.73_m2} (ref >59)
Glucose: 152 mg/dL — ABNORMAL HIGH (ref 65–99)
Phosphorus: 3.9 mg/dL (ref 3.0–4.3)
Potassium: 4.6 mmol/L (ref 3.5–5.2)
Sodium: 135 mmol/L (ref 134–144)

## 2018-08-06 LAB — LIPID PANEL
Chol/HDL Ratio: 4.6 ratio — ABNORMAL HIGH (ref 0.0–4.4)
Cholesterol, Total: 240 mg/dL — ABNORMAL HIGH (ref 100–199)
HDL: 52 mg/dL (ref >39)
LDL Calculated: 145 mg/dL — ABNORMAL HIGH (ref 0–99)
Triglycerides: 213 mg/dL — ABNORMAL HIGH (ref 0–149)
VLDL Cholesterol Cal: 43 mg/dL — ABNORMAL HIGH (ref 5–40)

## 2018-08-06 LAB — HEMOGLOBIN A1C
Est. average glucose Bld gHb Est-mCnc: 154 mg/dL
Hgb A1c MFr Bld: 7 % — ABNORMAL HIGH (ref 4.8–5.6)

## 2018-09-03 ENCOUNTER — Other Ambulatory Visit: Payer: Self-pay | Admitting: Family Medicine

## 2018-09-03 DIAGNOSIS — E119 Type 2 diabetes mellitus without complications: Secondary | ICD-10-CM

## 2018-10-12 ENCOUNTER — Telehealth: Payer: Self-pay

## 2018-10-12 ENCOUNTER — Telehealth: Payer: Self-pay | Admitting: *Deleted

## 2018-10-12 ENCOUNTER — Ambulatory Visit: Payer: BC Managed Care – PPO | Admitting: Family Medicine

## 2018-10-12 ENCOUNTER — Other Ambulatory Visit: Payer: BC Managed Care – PPO

## 2018-10-12 DIAGNOSIS — Z20822 Contact with and (suspected) exposure to covid-19: Secondary | ICD-10-CM

## 2018-10-12 NOTE — Telephone Encounter (Signed)
Pt scheduled for covid testing today @ [facility] @ 1:45. Instructions given and order placed

## 2018-10-12 NOTE — Telephone Encounter (Signed)
Pt called in today with concerns for covid. Wanted to be tested. Symptoms are sore throat, no fever, malaise/ fatigue- put in order for test, also informed the pt that she and her husband are to be in quarantine for the 14 day period. Avoid being around grandkids and family. She voiced understanding and said, "We know"

## 2018-10-12 NOTE — Telephone Encounter (Signed)
-----   Message from Fredderick Severance sent at 10/12/2018 11:31 AM EDT ----- Pt prefers cell number [phone]. Having symptoms of cough, malaise/ fatigue, sore throat, runny nose, no fever- started Saturday. Been advised that you will contact her. Husband Neilah Fulwider) will be coming as well- Dr Gaspar Cola pt. They will put in his order

## 2018-10-16 LAB — NOVEL CORONAVIRUS, NAA: SARS-CoV-2, NAA: NOT DETECTED

## 2018-11-12 ENCOUNTER — Other Ambulatory Visit: Payer: Self-pay | Admitting: Family Medicine

## 2018-11-12 DIAGNOSIS — K219 Gastro-esophageal reflux disease without esophagitis: Secondary | ICD-10-CM

## 2019-01-05 IMAGING — CT CT ABD-PELV W/ CM
2 of 5 series · 16 of 46 positions shown, 18 images · IV contrast (APPLIED)
Comparison: 02/09/2015

CLINICAL DATA: Right upper quadrant pain with nausea and vomiting
for 2 weeks.

EXAM:
CT ABDOMEN AND PELVIS WITH CONTRAST
TECHNIQUE: Multidetector CT imaging of the abdomen and pelvis was performed
using the standard protocol following bolus administration of
intravenous contrast.
CONTRAST:  100mL 4RZTM0-XGG IOPAMIDOL (4RZTM0-XGG) INJECTION 61%

[Series 2: routine abd/pel with · axial · 0.84mm/px · z∈[-913,-468]mm · 13 of 101 slices shown, 15 images]
[im 6/101  soft-tissue]
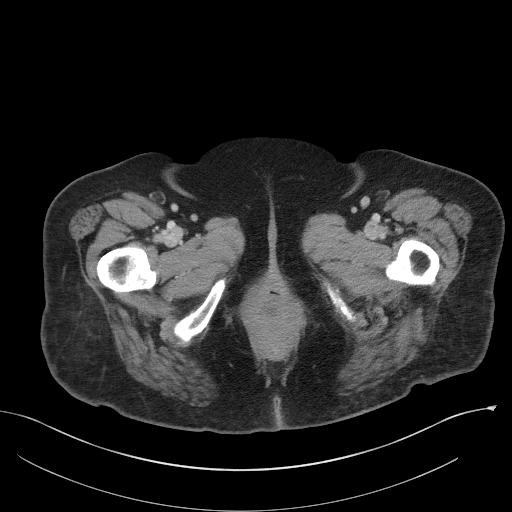
[im 6/101  bone]
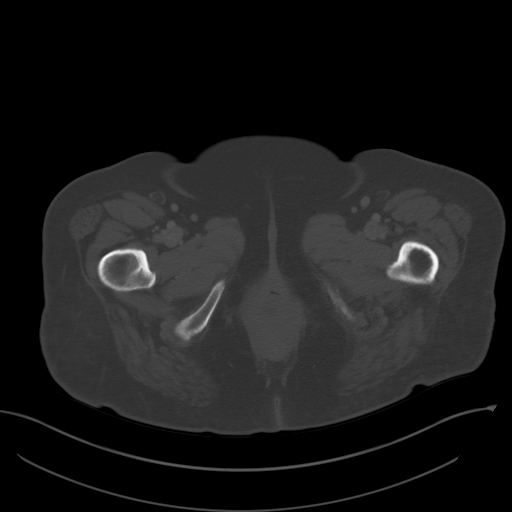
[im 16/101  soft-tissue]
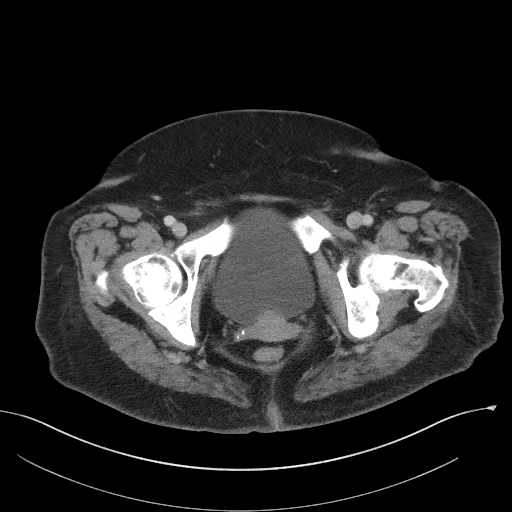
[im 22/101  soft-tissue]
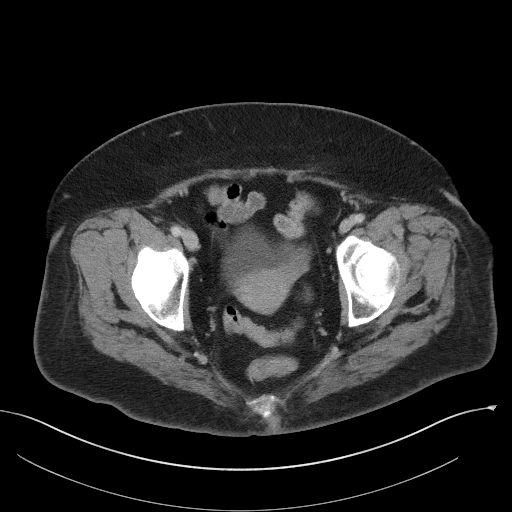
[im 27/101  soft-tissue]
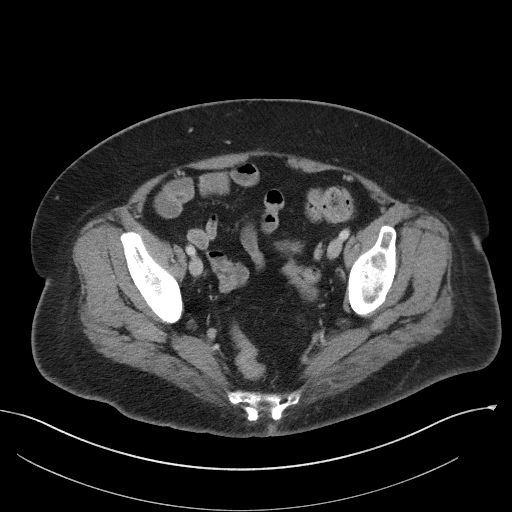
[im 37/101  soft-tissue]
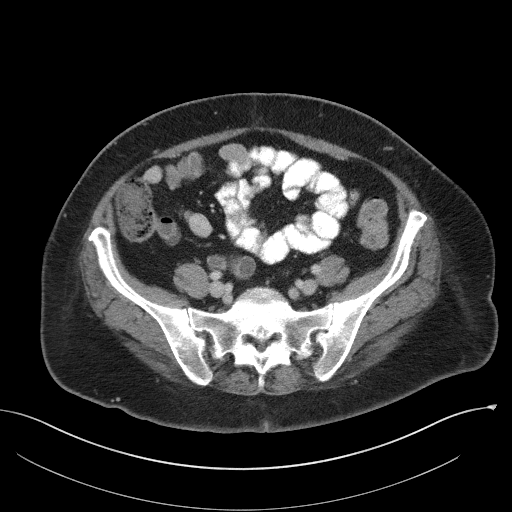
[im 43/101  soft-tissue]
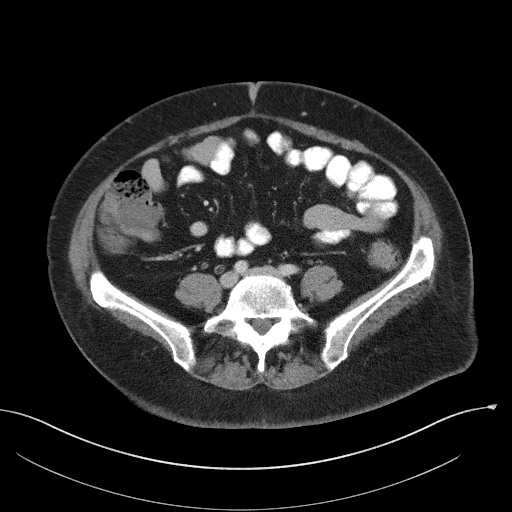
[im 53/101  soft-tissue]
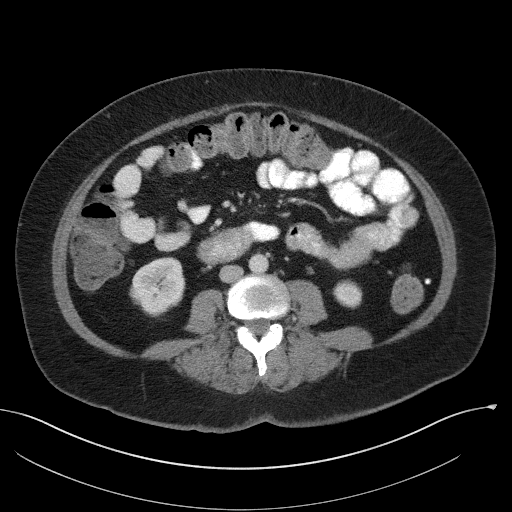
[im 58/101  soft-tissue]
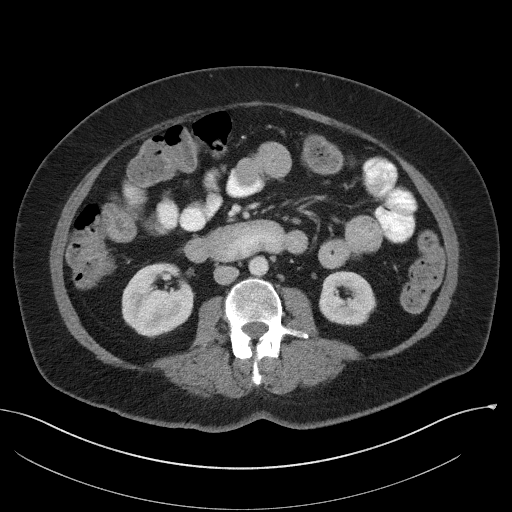
[im 64/101  soft-tissue]
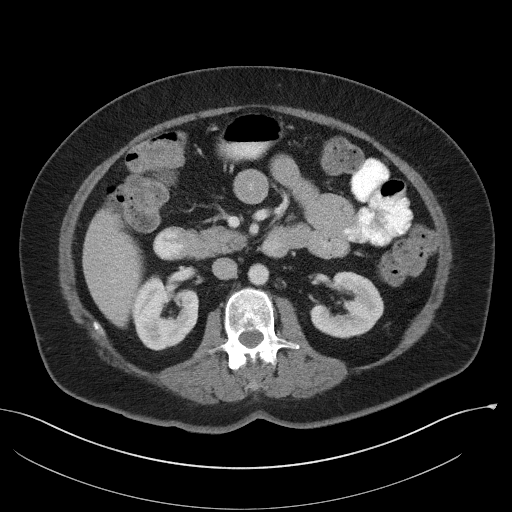
[im 64/101  bone]
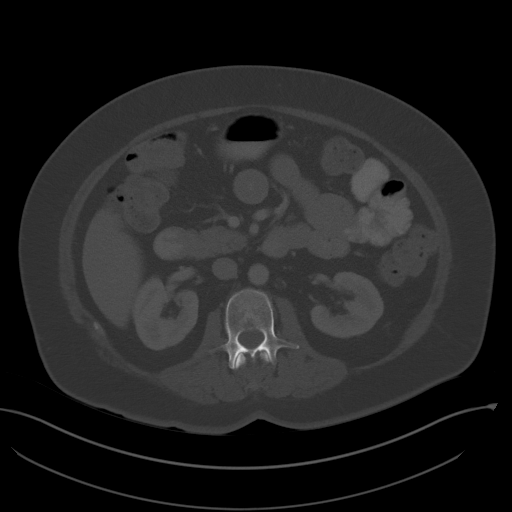
[im 74/101  soft-tissue]
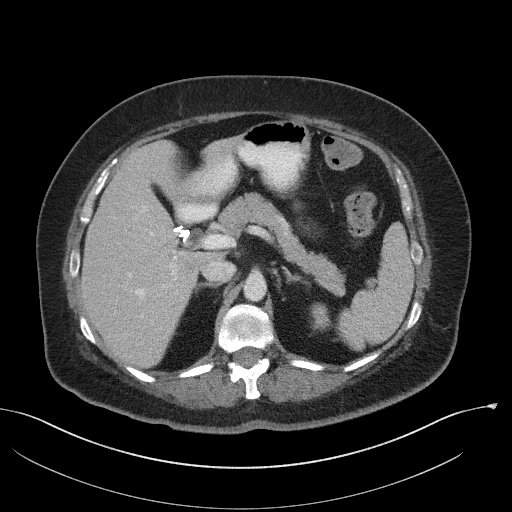
[im 79/101  soft-tissue]
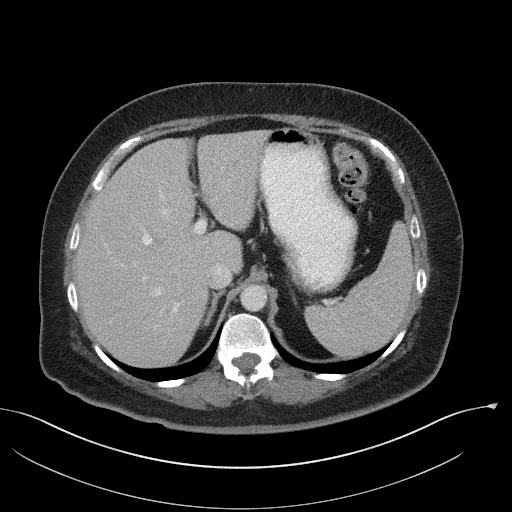
[im 85/101  soft-tissue]
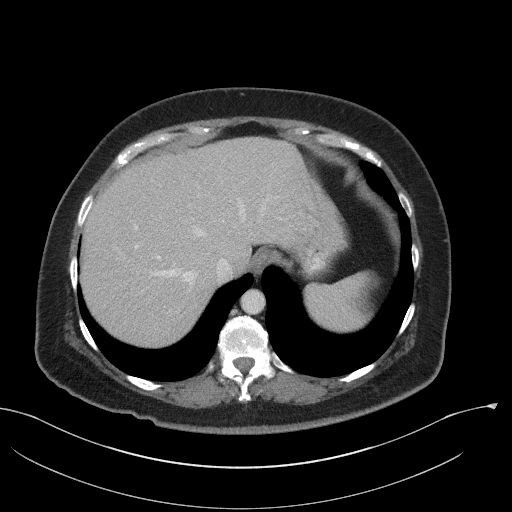
[im 95/101  soft-tissue]
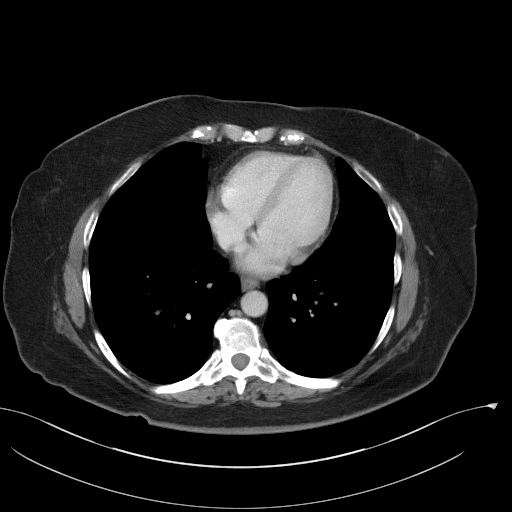

[Series 6: coronal st · coronal · 0.79mm/px · 3 of 96 slices shown]
[im 32/96  soft-tissue]
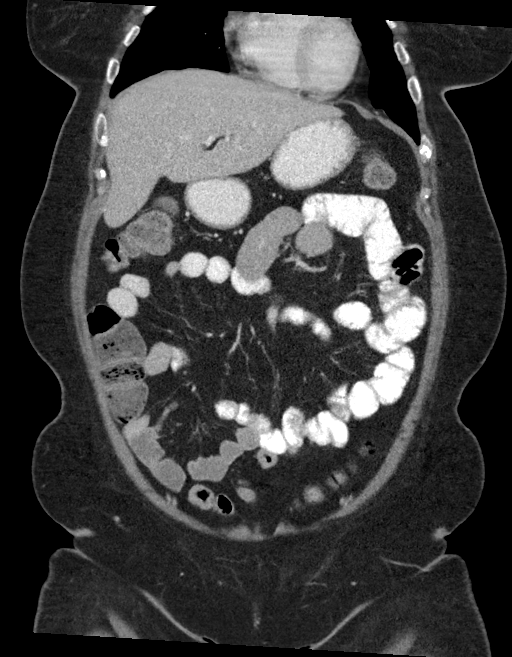
[im 43/96  soft-tissue]
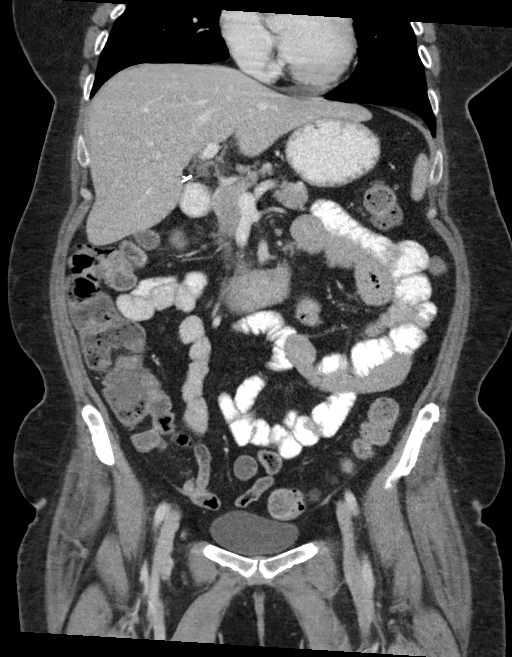
[im 53/96  soft-tissue]
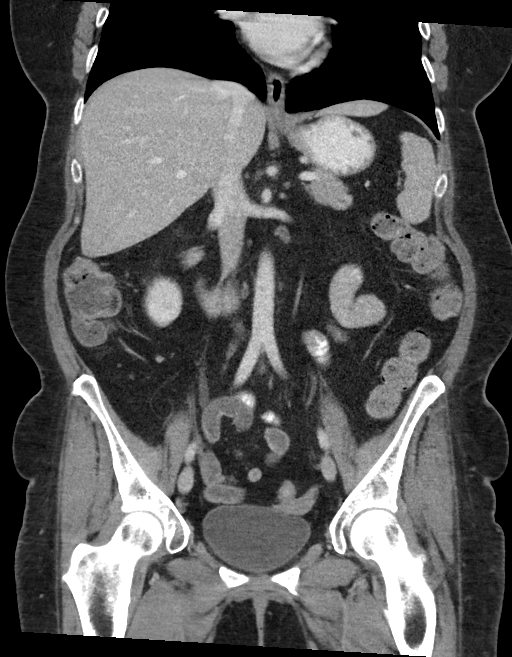

[16 of 46 positions shown; findings below may reference images not displayed]

FINDINGS: Lower chest: Lung bases are clear.

Hepatobiliary: Mild diffuse fatty infiltration of the liver. No
focal liver lesions. Surgical absence of the gallbladder. No bile
duct dilatation.

Pancreas: Unremarkable. No pancreatic ductal dilatation or
surrounding inflammatory changes.

Spleen: Normal in size without focal abnormality.

Adrenals/Urinary Tract: Adrenal glands are unremarkable. Kidneys are
normal, without renal calculi, focal lesion, or hydronephrosis. No
wall thickening or filling defect in the bladder.

Stomach/Bowel: Stomach is within normal limits. Appendix is not
identified. No evidence of bowel wall thickening, distention, or
inflammatory changes.

Vascular/Lymphatic: No significant vascular findings are present. No
enlarged abdominal or pelvic lymph nodes.

Reproductive: Uterus and bilateral adnexa are unremarkable.

Other: There is laxity of the pelvic floor musculature with a small
cystocele present. No free air or free fluid in the abdomen.

Musculoskeletal: No acute or significant osseous findings.
IMPRESSION: 1. Mild fatty infiltration of the liver.  No bile duct dilatation.
2. Laxity of the pelvic floor musculature with small cystocele.
3. No bowel obstruction or inflammation.

## 2019-01-14 ENCOUNTER — Other Ambulatory Visit: Payer: Self-pay | Admitting: Family Medicine

## 2019-01-14 DIAGNOSIS — K219 Gastro-esophageal reflux disease without esophagitis: Secondary | ICD-10-CM

## 2019-02-12 ENCOUNTER — Other Ambulatory Visit: Payer: Self-pay | Admitting: Family Medicine

## 2019-02-12 ENCOUNTER — Telehealth: Payer: Self-pay | Admitting: Family Medicine

## 2019-02-12 DIAGNOSIS — K219 Gastro-esophageal reflux disease without esophagitis: Secondary | ICD-10-CM

## 2019-02-12 DIAGNOSIS — I10 Essential (primary) hypertension: Secondary | ICD-10-CM

## 2019-02-12 DIAGNOSIS — E782 Mixed hyperlipidemia: Secondary | ICD-10-CM

## 2019-02-12 DIAGNOSIS — F3341 Major depressive disorder, recurrent, in partial remission: Secondary | ICD-10-CM

## 2019-02-12 DIAGNOSIS — E119 Type 2 diabetes mellitus without complications: Secondary | ICD-10-CM

## 2019-02-12 NOTE — Telephone Encounter (Signed)
Patient request refills for   DEXILANT 60 MG capsule  lisinopril (ZESTRIL) 20 MG tablet metFORMIN (GLUCOPHAGE) 1000 MG tablet pioglitazone (ACTOS) 15 MG tablet   sertraline (ZOLOFT) 100 MG tablet  sertraline (ZOLOFT) 50 MG tablet   [facility] [id number] - [place] ([address]), [place]

## 2019-02-16 NOTE — Telephone Encounter (Signed)
Her meds were sent in for a 30 day supply- she might want 90, but needs to be seen first

## 2019-03-17 ENCOUNTER — Other Ambulatory Visit: Payer: Self-pay

## 2019-03-17 DIAGNOSIS — E782 Mixed hyperlipidemia: Secondary | ICD-10-CM

## 2019-03-17 DIAGNOSIS — I10 Essential (primary) hypertension: Secondary | ICD-10-CM

## 2019-03-17 DIAGNOSIS — F3341 Major depressive disorder, recurrent, in partial remission: Secondary | ICD-10-CM

## 2019-03-17 DIAGNOSIS — E119 Type 2 diabetes mellitus without complications: Secondary | ICD-10-CM

## 2019-03-17 MED ORDER — METFORMIN HCL 1000 MG PO TABS: 1000.0000 mg | ORAL_TABLET | Freq: Two times a day (BID) | ORAL | 0 refills | Status: DC

## 2019-03-17 MED ORDER — LOVASTATIN 20 MG PO TABS: 20.0000 mg | ORAL_TABLET | Freq: Every day | ORAL | 0 refills | Status: DC

## 2019-03-17 MED ORDER — SERTRALINE HCL 50 MG PO TABS: ORAL_TABLET | ORAL | 0 refills | Status: DC

## 2019-03-17 MED ORDER — SERTRALINE HCL 100 MG PO TABS: ORAL_TABLET | ORAL | 0 refills | Status: DC

## 2019-03-17 MED ORDER — PIOGLITAZONE HCL 15 MG PO TABS: ORAL_TABLET | ORAL | 0 refills | Status: DC

## 2019-03-17 MED ORDER — LISINOPRIL 20 MG PO TABS: 20.0000 mg | ORAL_TABLET | Freq: Every day | ORAL | 0 refills | Status: DC

## 2019-03-24 ENCOUNTER — Ambulatory Visit: Payer: BC Managed Care – PPO | Admitting: Family Medicine

## 2019-03-24 ENCOUNTER — Encounter: Payer: Self-pay | Admitting: Family Medicine

## 2019-03-24 ENCOUNTER — Other Ambulatory Visit: Payer: Self-pay

## 2019-03-24 VITALS — BP 130/68 | HR 80 | Ht 68.0 in | Wt 234.0 lb

## 2019-03-24 DIAGNOSIS — E119 Type 2 diabetes mellitus without complications: Secondary | ICD-10-CM

## 2019-03-24 DIAGNOSIS — E782 Mixed hyperlipidemia: Secondary | ICD-10-CM | POA: Diagnosis not present

## 2019-03-24 DIAGNOSIS — K219 Gastro-esophageal reflux disease without esophagitis: Secondary | ICD-10-CM

## 2019-03-24 DIAGNOSIS — I10 Essential (primary) hypertension: Secondary | ICD-10-CM

## 2019-03-24 DIAGNOSIS — F3341 Major depressive disorder, recurrent, in partial remission: Secondary | ICD-10-CM

## 2019-03-24 DIAGNOSIS — R69 Illness, unspecified: Secondary | ICD-10-CM

## 2019-03-24 MED ORDER — DEXILANT 60 MG PO CPDR: 1.0000 | DELAYED_RELEASE_CAPSULE | Freq: Every day | ORAL | 5 refills | Status: DC

## 2019-03-24 MED ORDER — PIOGLITAZONE HCL 15 MG PO TABS: ORAL_TABLET | ORAL | 5 refills | Status: DC

## 2019-03-24 MED ORDER — SERTRALINE HCL 100 MG PO TABS: ORAL_TABLET | ORAL | 5 refills | Status: DC

## 2019-03-24 MED ORDER — SERTRALINE HCL 50 MG PO TABS: ORAL_TABLET | ORAL | 5 refills | Status: DC

## 2019-03-24 MED ORDER — LOVASTATIN 20 MG PO TABS: 20.0000 mg | ORAL_TABLET | Freq: Every day | ORAL | 5 refills | Status: DC

## 2019-03-24 MED ORDER — LISINOPRIL 20 MG PO TABS: 20.0000 mg | ORAL_TABLET | Freq: Every day | ORAL | 5 refills | Status: DC

## 2019-03-24 MED ORDER — VICTOZA 18 MG/3ML ~~LOC~~ SOPN: PEN_INJECTOR | SUBCUTANEOUS | 1 refills | Status: DC

## 2019-03-24 MED ORDER — METFORMIN HCL 1000 MG PO TABS: 1000.0000 mg | ORAL_TABLET | Freq: Two times a day (BID) | ORAL | 5 refills | Status: DC

## 2019-03-24 NOTE — Progress Notes (Signed)
Date:  03/24/2019   Name:  Betty Walters   DOB:  Dec 07, 1960   MRN:  [id number]   Chief Complaint: Diabetes, Depression (PHQ9=4 and GAD7=7), Hyperlipidemia, Hypertension, and Gastroesophageal Reflux  Diabetes She presents for her follow-up diabetic visit. She has type 2 diabetes mellitus. Her disease course has been stable. There are no hypoglycemic associated symptoms. Pertinent negatives for hypoglycemia include no dizziness, headaches, nervousness/anxiousness or sweats. There are no diabetic associated symptoms. Pertinent negatives for diabetes include no blurred vision, no chest pain, no fatigue and no polydipsia. There are no hypoglycemic complications. Symptoms are stable. There are no diabetic complications. Pertinent negatives for diabetic complications include no CVA, PVD or retinopathy. There are no known risk factors for coronary artery disease. Current diabetic treatment includes oral agent (monotherapy) (and victoza). She is compliant with treatment all of the time. Her weight is stable. She is following a generally healthy diet. She participates in exercise intermittently. Her home blood glucose trend is fluctuating minimally. Her breakfast blood glucose is taken between 8-9 am. Her breakfast blood glucose range is generally 140-180 mg/dl. An ACE inhibitor/angiotensin II receptor blocker is being taken. She does not see a podiatrist.Eye exam is not current.  Depression        This is a chronic problem.  The current episode started more than 1 year ago.   The onset quality is gradual.   The problem occurs intermittently.  The problem has been waxing and waning since onset.  Associated symptoms include hopelessness.  Associated symptoms include no decreased concentration, no fatigue, no helplessness, does not have insomnia, not irritable, no restlessness, no decreased interest, no appetite change, no body aches, no myalgias, no headaches, no indigestion, not sad and no suicidal ideas.   Past treatments include SSRIs - Selective serotonin reuptake inhibitors.  Compliance with treatment is good.  Previous treatment provided moderate relief.   Pertinent negatives include no hypothyroidism and no anxiety. Hyperlipidemia This is a chronic problem. The current episode started more than 1 year ago. Recent lipid tests were reviewed and are normal. She has no history of chronic renal disease, diabetes, hypothyroidism, liver disease, obesity or nephrotic syndrome. There are no known factors aggravating her hyperlipidemia. Pertinent negatives include no chest pain, focal sensory loss, focal weakness, leg pain, myalgias or shortness of breath. Current antihyperlipidemic treatment includes statins. The current treatment provides moderate improvement of lipids. Risk factors for coronary artery disease include diabetes mellitus, hypertension, stress and dyslipidemia.  Hypertension This is a chronic problem. The current episode started more than 1 year ago. The problem has been waxing and waning since onset. The problem is controlled. Pertinent negatives include no anxiety, blurred vision, chest pain, headaches, malaise/fatigue, neck pain, orthopnea, palpitations, peripheral edema, PND, shortness of breath or sweats. There are no associated agents to hypertension. Past treatments include ACE inhibitors. The current treatment provides mild improvement. There are no compliance problems.  There is no history of angina, kidney disease, CAD/MI, CVA, heart failure, left ventricular hypertrophy, PVD or retinopathy. There is no history of chronic renal disease, a hypertension causing med or renovascular disease.  Gastroesophageal Reflux She reports no abdominal pain, no belching, no chest pain, no choking, no coughing, no dysphagia, no early satiety, no globus sensation, no heartburn, no hoarse voice, no nausea, no sore throat or no wheezing. This is a chronic problem. The current episode started more than 1 year  ago. The problem occurs occasionally. The problem has been gradually improving. The symptoms are  aggravated by certain foods. Pertinent negatives include no fatigue. She has tried a PPI (must dexilant for control) for the symptoms. The treatment provided moderate relief.    Lab Results  Component Value Date   CREATININE 0.81 08/05/2018   BUN 18 08/05/2018   NA 135 08/05/2018   K 4.6 08/05/2018   CL 99 08/05/2018   CO2 19 (L) 08/05/2018   Lab Results  Component Value Date   CHOL 240 (H) 08/05/2018   HDL 52 08/05/2018   LDLCALC 145 (H) 08/05/2018   TRIG 213 (H) 08/05/2018   CHOLHDL 4.6 (H) 08/05/2018   No results found for: TSH Lab Results  Component Value Date   HGBA1C 7.0 (H) 08/05/2018     Review of Systems  Constitutional: Negative for appetite change, chills, fatigue, fever and malaise/fatigue.  HENT: Negative for drooling, ear discharge, ear pain, hoarse voice and sore throat.   Eyes: Negative for blurred vision.  Respiratory: Negative for cough, choking, shortness of breath and wheezing.   Cardiovascular: Negative for chest pain, palpitations, orthopnea, leg swelling and PND.  Gastrointestinal: Negative for abdominal pain, blood in stool, constipation, diarrhea, dysphagia, heartburn and nausea.  Endocrine: Negative for polydipsia.  Genitourinary: Negative for dysuria, frequency, hematuria and urgency.  Musculoskeletal: Negative for back pain, myalgias and neck pain.  Skin: Negative for rash.  Allergic/Immunologic: Negative for environmental allergies.  Neurological: Negative for dizziness, focal weakness and headaches.  Hematological: Does not bruise/bleed easily.  Psychiatric/Behavioral: Positive for depression. Negative for decreased concentration and suicidal ideas. The patient is not nervous/anxious and does not have insomnia.     Patient Active Problem List   Diagnosis Date Noted  . Heartburn   . Dyspepsia   . Change in bowel habits   . Benign neoplasm of  descending colon   . Chronic pain of toes of both feet 09/20/2016  . Type 2 diabetes mellitus with complication ([place]) 0000000  . Allergic rhinitis due to pollen 06/21/2015  . Concussion with coma 10/21/2014  . Clinical depression 09/28/2014  . Diabetes mellitus, type 2 ([place]) 09/28/2014  . HLD (hyperlipidemia) 09/28/2014  . Essential hypertension 09/28/2014  . Adiposity 09/28/2014    Allergies  Allergen Reactions  . Amoxicillin-Pot Clavulanate Nausea And Vomiting    (OK with Amoxicillin alone)    Past Surgical History:  Procedure Laterality Date  . CESAREAN SECTION    . COLONOSCOPY WITH PROPOFOL N/A 04/11/2017   Procedure: COLONOSCOPY WITH colon;  Surgeon: Lucilla Lame, MD;  Location: [facility];  Service: Endoscopy;  Laterality: N/A;  . ESOPHAGOGASTRODUODENOSCOPY (EGD) WITH PROPOFOL N/A 04/11/2017   Procedure: ESOPHAGOGASTRODUODENOSCOPY (EGD) WITH PROPOFOL;  Surgeon: Lucilla Lame, MD;  Location: [facility];  Service: Endoscopy;  Laterality: N/A;  Diabetic - oral meds  . GALLBLADDER SURGERY    . KNEE SURGERY    . POLYPECTOMY  04/11/2017   Procedure: POLYPECTOMY INTESTINAL;  Surgeon: Lucilla Lame, MD;  Location: [facility];  Service: Endoscopy;;  . WRIST SURGERY      Social History   Tobacco Use  . Smoking status: Never Smoker  . Smokeless tobacco: Never Used  Substance Use Topics  . Alcohol use: Yes    Alcohol/week: 0.0 standard drinks    Comment: rare - Holidays  . Drug use: No     Medication list has been reviewed and updated.  Current Meds  Medication Sig  . celecoxib (CELEBREX) 200 MG capsule TAKE 1 CAPSULE BY MOUTH ONCE DAILY AS NEEDED  . cyclobenzaprine (FLEXERIL) 5  MG tablet Take 5 mg by mouth at bedtime as needed. Para March  . DEXILANT 60 MG capsule Take 1 capsule by mouth once daily  . gabapentin (NEURONTIN) 300 MG capsule Take 300 mg by mouth at bedtime. Para March  . liraglutide (VICTOZA) 18 MG/3ML SOPN INJECT 1.8MG  SUBCUTANEOUSLY  ONCE DAILY  . lisinopril (ZESTRIL) 20 MG tablet Take 1 tablet (20 mg total) by mouth daily.  Marland Kitchen lovastatin (MEVACOR) 20 MG tablet Take 1 tablet (20 mg total) by mouth daily.  . metFORMIN (GLUCOPHAGE) 1000 MG tablet Take 1 tablet (1,000 mg total) by mouth 2 (two) times daily.  . pioglitazone (ACTOS) 15 MG tablet TAKE 1 TABLET BY MOUTH ONCE DAILY -  NEED  APPT  FOR  FURTHER  REFILLS  . sertraline (ZOLOFT) 100 MG tablet TAKE 1 TABLET BY MOUTH ONCE DAILY (ALONG  WITH  50MG   TABLETS  FOR  TDD  150MG )  . sertraline (ZOLOFT) 50 MG tablet TAKE 1 TABLET BY MOUTH ONCE DAILY -  NEED  APPT  FOR  REFILLS    PHQ 2/9 Scores 03/24/2019 08/05/2018 12/22/2017 04/24/2017  PHQ - 2 Score 1 4 0 0  PHQ- 9 Score 4 10 3 2     BP Readings from Last 3 Encounters:  03/24/19 130/68  08/05/18 130/62  12/22/17 110/80    Physical Exam Vitals and nursing note reviewed.  Constitutional:      General: She is not irritable.    Appearance: She is well-developed. She is obese.  HENT:     Head: Normocephalic.     Right Ear: External ear normal.     Left Ear: External ear normal.  Eyes:     General: Lids are everted, no foreign bodies appreciated. No scleral icterus.       Left eye: No foreign body or hordeolum.     Conjunctiva/sclera: Conjunctivae normal.     Right eye: Right conjunctiva is not injected.     Left eye: Left conjunctiva is not injected.     Pupils: Pupils are equal, round, and reactive to light.  Neck:     Thyroid: No thyromegaly.     Vascular: No JVD.     Trachea: No tracheal deviation.  Cardiovascular:     Rate and Rhythm: Normal rate and regular rhythm.     Heart sounds: Normal heart sounds. No murmur. No friction rub. No gallop.   Pulmonary:     Effort: Pulmonary effort is normal. No respiratory distress.     Breath sounds: Normal breath sounds. No wheezing or rales.  Abdominal:     General: Bowel sounds are normal.     Palpations: Abdomen is soft. There is no mass.     Tenderness: There is no  abdominal tenderness. There is no guarding or rebound.  Musculoskeletal:        General: No tenderness. Normal range of motion.     Cervical back: Normal range of motion and neck supple.  Lymphadenopathy:     Cervical: No cervical adenopathy.  Skin:    General: Skin is warm.     Findings: No rash.  Neurological:     Mental Status: She is alert and oriented to person, place, and time.     Cranial Nerves: No cranial nerve deficit.     Deep Tendon Reflexes: Reflexes normal.  Psychiatric:        Mood and Affect: Mood is not anxious or depressed.     Wt Readings from Last 3 Encounters:  03/24/19 234 lb (  106.1 kg)  08/05/18 213 lb (96.6 kg)  12/22/17 213 lb (96.6 kg)    BP 130/68   Pulse 80   Ht 5\' 8"  (1.727 m)   Wt 234 lb (106.1 kg)   BMI 35.58 kg/m   Assessment and Plan: 1. Gastroesophageal reflux disease Chronic.  Controlled.  Stable.  This is controlled as long as patient is able to take Dexilant it is not controlled by other PPIs.  And patient must have the full half-life of 24 hours for control.  Otherwise she has to take antacids at night. - dexlansoprazole (DEXILANT) 60 MG capsule; Take 1 capsule (60 mg total) by mouth daily.  Dispense: 30 capsule; Refill: 5  2. Type 2 diabetes mellitus without complication, without long-term current use of insulin (HCC) Chronic.  Controlled.  Stable.  Patient has not been able to take her Victoza because of a frozen shoulder.  Patient's only recently restarted her Victoza at 1.8 mg daily.  Patient will continue Metformin 1 g twice a day as well as Actos 15 mg once a day.  Will check an A1c as well as microalbuminuria. - liraglutide (VICTOZA) 18 MG/3ML SOPN; INJECT 1.8MG  SUBCUTANEOUSLY ONCE DAILY  Dispense: 9 mL; Refill: 1 - metFORMIN (GLUCOPHAGE) 1000 MG tablet; Take 1 tablet (1,000 mg total) by mouth 2 (two) times daily.  Dispense: 60 tablet; Refill: 5 - pioglitazone (ACTOS) 15 MG tablet; TAKE 1 TABLET BY MOUTH ONCE DAILY -  NEED  APPT   FOR  FURTHER  REFILLS  Dispense: 30 tablet; Refill: 5 - HgB A1c - Microalbumin, urine  3. Essential hypertension Chronic.  Controlled.  Stable.  Continue lisinopril 20 mg once a day. - lisinopril (ZESTRIL) 20 MG tablet; Take 1 tablet (20 mg total) by mouth daily.  Dispense: 30 tablet; Refill: 5  4. Mixed hyperlipidemia Chronic.  Controlled.  Stable.  Continue lovastatin 20 mg once a day.  Check lipid panel. - lovastatin (MEVACOR) 20 MG tablet; Take 1 tablet (20 mg total) by mouth daily.  Dispense: 30 tablet; Refill: 5 - Lipid Panel With LDL/HDL Ratio  5. Recurrent major depressive disorder, in partial remission (HCC) PHQ is 4.  Gad score is 7.  Patient's been under some stress given the health condition of her spouse.  Patient will continue sertraline at 150 mg daily for which she takes 100 mg once a day and 50 mg once a day as well. - sertraline (ZOLOFT) 100 MG tablet; TAKE 1 TABLET BY MOUTH ONCE DAILY (ALONG  WITH  50MG   TABLETS  FOR  TDD  150MG )  Dispense: 30 tablet; Refill: 5 - sertraline (ZOLOFT) 50 MG tablet; TAKE 1 TABLET BY MOUTH ONCE DAILY -  NEED  APPT  FOR  REFILLS  Dispense: 30 tablet; Refill: 5  6. Taking medication for chronic disease Patient is currently on a statin we will check hepatic function panel. - Hepatic function panel

## 2019-03-25 LAB — LIPID PANEL WITH LDL/HDL RATIO
Cholesterol, Total: 214 mg/dL — ABNORMAL HIGH (ref 100–199)
HDL: 49 mg/dL (ref >39)
LDL Chol Calc (NIH): 132 mg/dL — ABNORMAL HIGH (ref 0–99)
LDL/HDL Ratio: 2.7 ratio (ref 0.0–3.2)
Triglycerides: 187 mg/dL — ABNORMAL HIGH (ref 0–149)
VLDL Cholesterol Cal: 33 mg/dL (ref 5–40)

## 2019-03-25 LAB — RENAL FUNCTION PANEL
Albumin: 4.3 g/dL (ref 3.8–4.9)
BUN/Creatinine Ratio: 24 — ABNORMAL HIGH (ref 9–23)
BUN: 17 mg/dL (ref 6–24)
CO2: 23 mmol/L (ref 20–29)
Calcium: 10.1 mg/dL (ref 8.7–10.2)
Chloride: 102 mmol/L (ref 96–106)
Creatinine, Ser: 0.71 mg/dL (ref 0.57–1.00)
GFR calc Af Amer: 110 mL/min/{1.73_m2} (ref >59)
GFR calc non Af Amer: 96 mL/min/{1.73_m2} (ref >59)
Glucose: 130 mg/dL — ABNORMAL HIGH (ref 65–99)
Phosphorus: 3.9 mg/dL (ref 3.0–4.3)
Potassium: 4.9 mmol/L (ref 3.5–5.2)
Sodium: 139 mmol/L (ref 134–144)

## 2019-03-25 LAB — HEMOGLOBIN A1C
Est. average glucose Bld gHb Est-mCnc: 163 mg/dL
Hgb A1c MFr Bld: 7.3 % — ABNORMAL HIGH (ref 4.8–5.6)

## 2019-03-25 LAB — HEPATIC FUNCTION PANEL
ALT: 18 IU/L (ref 0–32)
AST: 12 IU/L (ref 0–40)
Alkaline Phosphatase: 117 IU/L (ref 39–117)
Bilirubin Total: 0.3 mg/dL (ref 0.0–1.2)
Bilirubin, Direct: 0.09 mg/dL (ref 0.00–0.40)
Total Protein: 6.7 g/dL (ref 6.0–8.5)

## 2019-03-25 LAB — MICROALBUMIN, URINE: Microalbumin, Urine: <3 ug/mL

## 2019-03-29 ENCOUNTER — Other Ambulatory Visit: Payer: Self-pay

## 2019-03-29 DIAGNOSIS — E782 Mixed hyperlipidemia: Secondary | ICD-10-CM

## 2019-03-29 MED ORDER — LOVASTATIN 40 MG PO TABS: 40.0000 mg | ORAL_TABLET | Freq: Every day | ORAL | 0 refills | Status: DC

## 2019-04-05 ENCOUNTER — Ambulatory Visit: Payer: BC Managed Care – PPO | Admitting: Family Medicine

## 2019-04-08 ENCOUNTER — Ambulatory Visit (INDEPENDENT_AMBULATORY_CARE_PROVIDER_SITE_OTHER): Payer: BC Managed Care – PPO | Admitting: Family Medicine

## 2019-04-08 ENCOUNTER — Encounter: Payer: Self-pay | Admitting: Family Medicine

## 2019-04-08 ENCOUNTER — Other Ambulatory Visit: Payer: Self-pay

## 2019-04-08 VITALS — Temp 97.2°F

## 2019-04-08 DIAGNOSIS — R059 Cough, unspecified: Secondary | ICD-10-CM

## 2019-04-08 DIAGNOSIS — R05 Cough: Secondary | ICD-10-CM | POA: Diagnosis not present

## 2019-04-08 DIAGNOSIS — J0100 Acute maxillary sinusitis, unspecified: Secondary | ICD-10-CM

## 2019-04-08 MED ORDER — AMOXICILLIN 500 MG PO CAPS: 500.0000 mg | ORAL_CAPSULE | Freq: Three times a day (TID) | ORAL | 0 refills | Status: DC

## 2019-04-08 NOTE — Progress Notes (Signed)
Date:  04/08/2019   Name:  Betty Walters   DOB:  24-May-1960   MRN:  [id number]   Chief Complaint: Sinusitis (headache, hurts on sides of nose and above nose, some cough- no production, chills/ no fever since Monday- tested negative for covid through [facility] )  I connected withthis patient, Betty Walters, by telephoneat the patient's home.  I verified that I am speaking with the correct person using two identifiers. This visit was conducted via telephone due to the Covid-19 outbreak from my office at [facility] in [place], [place]. I discussed the limitations, risks, security and privacy concerns of performing an evaluation and management service by telephone. I also discussed with the patient that there may be a patient responsible charge related to this service. The patient expressed understanding and agreed to proceed.  Sinusitis This is a new problem. The current episode started in the past 7 days. The problem has been gradually worsening since onset. The maximum temperature recorded prior to her arrival was 100.4 - 100.9 F. Her pain is at a severity of 2/10. The pain is moderate. Associated symptoms include chills, congestion, coughing, diaphoresis, headaches, sinus pressure and a sore throat. Pertinent negatives include no ear pain, hoarse voice, neck pain, shortness of breath, sneezing or swollen glands. (Myalgias) Past treatments include oral decongestants and acetaminophen. The treatment provided no relief.    Lab Results  Component Value Date   CREATININE 0.71 03/24/2019   BUN 17 03/24/2019   NA 139 03/24/2019   K 4.9 03/24/2019   CL 102 03/24/2019   CO2 23 03/24/2019   Lab Results  Component Value Date   CHOL 214 (H) 03/24/2019   HDL 49 03/24/2019   LDLCALC 132 (H) 03/24/2019   TRIG 187 (H) 03/24/2019   CHOLHDL 4.6 (H) 08/05/2018   No results found for: TSH Lab Results  Component Value Date   HGBA1C 7.3 (H) 03/24/2019     Review of Systems    Constitutional: Positive for chills and diaphoresis. Negative for fever.  HENT: Positive for congestion, sinus pressure and sore throat. Negative for drooling, ear discharge, ear pain, hearing loss, hoarse voice, mouth sores, nosebleeds and sneezing.   Respiratory: Positive for cough. Negative for choking, chest tightness, shortness of breath and wheezing.   Cardiovascular: Negative for chest pain, palpitations and leg swelling.  Gastrointestinal: Positive for diarrhea. Negative for abdominal pain, blood in stool, constipation and nausea.  Endocrine: Negative for polydipsia.  Genitourinary: Negative for dysuria, frequency, hematuria and urgency.  Musculoskeletal: Positive for myalgias. Negative for back pain and neck pain.  Skin: Negative for rash.  Allergic/Immunologic: Negative for environmental allergies.  Neurological: Positive for headaches. Negative for dizziness.  Hematological: Does not bruise/bleed easily.  Psychiatric/Behavioral: Negative for suicidal ideas. The patient is not nervous/anxious.     Patient Active Problem List   Diagnosis Date Noted  . Heartburn   . Dyspepsia   . Change in bowel habits   . Benign neoplasm of descending colon   . Chronic pain of toes of both feet 09/20/2016  . Type 2 diabetes mellitus with complication ([place]) 0000000  . Allergic rhinitis due to pollen 06/21/2015  . Concussion with coma 10/21/2014  . Clinical depression 09/28/2014  . Diabetes mellitus, type 2 ([place]) 09/28/2014  . HLD (hyperlipidemia) 09/28/2014  . Essential hypertension 09/28/2014  . Adiposity 09/28/2014    Allergies  Allergen Reactions  . Amoxicillin-Pot Clavulanate Nausea And Vomiting    (OK with Amoxicillin alone)  Past Surgical History:  Procedure Laterality Date  . CESAREAN SECTION    . COLONOSCOPY WITH PROPOFOL N/A 04/11/2017   Procedure: COLONOSCOPY WITH colon;  Surgeon: Lucilla Lame, MD;  Location: [facility];  Service: Endoscopy;  Laterality: N/A;   . ESOPHAGOGASTRODUODENOSCOPY (EGD) WITH PROPOFOL N/A 04/11/2017   Procedure: ESOPHAGOGASTRODUODENOSCOPY (EGD) WITH PROPOFOL;  Surgeon: Lucilla Lame, MD;  Location: [facility];  Service: Endoscopy;  Laterality: N/A;  Diabetic - oral meds  . GALLBLADDER SURGERY    . KNEE SURGERY    . POLYPECTOMY  04/11/2017   Procedure: POLYPECTOMY INTESTINAL;  Surgeon: Lucilla Lame, MD;  Location: [facility];  Service: Endoscopy;;  . WRIST SURGERY      Social History   Tobacco Use  . Smoking status: Never Smoker  . Smokeless tobacco: Never Used  Substance Use Topics  . Alcohol use: Yes    Alcohol/week: 0.0 standard drinks    Comment: rare - Holidays  . Drug use: No     Medication list has been reviewed and updated.  Current Meds  Medication Sig  . cyclobenzaprine (FLEXERIL) 5 MG tablet Take 5 mg by mouth at bedtime as needed. Para March  . dexlansoprazole (DEXILANT) 60 MG capsule Take 1 capsule (60 mg total) by mouth daily.  Marland Kitchen gabapentin (NEURONTIN) 300 MG capsule Take 300 mg by mouth at bedtime. Para March  . liraglutide (VICTOZA) 18 MG/3ML SOPN INJECT 1.8MG  SUBCUTANEOUSLY ONCE DAILY  . lisinopril (ZESTRIL) 20 MG tablet Take 1 tablet (20 mg total) by mouth daily.  Marland Kitchen lovastatin (MEVACOR) 40 MG tablet Take 1 tablet (40 mg total) by mouth daily.  . metFORMIN (GLUCOPHAGE) 1000 MG tablet Take 1 tablet (1,000 mg total) by mouth 2 (two) times daily.  . pioglitazone (ACTOS) 15 MG tablet TAKE 1 TABLET BY MOUTH ONCE DAILY -  NEED  APPT  FOR  FURTHER  REFILLS  . sertraline (ZOLOFT) 100 MG tablet TAKE 1 TABLET BY MOUTH ONCE DAILY (ALONG  WITH  50MG   TABLETS  FOR  TDD  150MG )  . sertraline (ZOLOFT) 50 MG tablet TAKE 1 TABLET BY MOUTH ONCE DAILY -  NEED  APPT  FOR  REFILLS    PHQ 2/9 Scores 03/24/2019 08/05/2018 12/22/2017 04/24/2017  PHQ - 2 Score 1 4 0 0  PHQ- 9 Score 4 10 3 2     BP Readings from Last 3 Encounters:  03/24/19 130/68  08/05/18 130/62  12/22/17 110/80    Physical Exam  Wt  Readings from Last 3 Encounters:  03/24/19 234 lb (106.1 kg)  08/05/18 213 lb (96.6 kg)  12/22/17 213 lb (96.6 kg)    Temp (!) 97.2 F (36.2 C) (Oral)   Assessment and Plan: 1. Acute maxillary sinusitis, recurrence not specified This is a televisit that has been conducted due to the current concerns of the Covid and patient has had exposure possibilities.  Acute.  Persistent.  Unresolving symptoms.  Exam and physical exam with tenderness over the left maxillary sinus consistent with an acute maxillary sinusitis.  We will initiate with amoxicillin 500 mg 3 times a day for 10 days.  Patient has been advised should fever become an issue as well is shortness of breath and cough that she may need to be checked for Covid concerns. - amoxicillin (AMOXIL) 500 MG capsule; Take 1 capsule (500 mg total) by mouth 3 (three) times daily.  Dispense: 30 capsule; Refill: 0  2. Cough Patient currently has a manageable cough with no significant coverage of medication at  this time needed.  Patient has been encouraged to obtain Mucinex DM for the expectorant qualities of this as well as the mild suppression of the DM.

## 2019-04-14 ENCOUNTER — Other Ambulatory Visit: Payer: Self-pay

## 2019-04-14 DIAGNOSIS — E782 Mixed hyperlipidemia: Secondary | ICD-10-CM

## 2019-04-14 MED ORDER — LOVASTATIN 40 MG PO TABS: 40.0000 mg | ORAL_TABLET | Freq: Every day | ORAL | 0 refills | Status: DC

## 2019-05-30 ENCOUNTER — Ambulatory Visit: Payer: BC Managed Care – PPO | Attending: Internal Medicine

## 2019-05-30 DIAGNOSIS — Z23 Encounter for immunization: Secondary | ICD-10-CM | POA: Insufficient documentation

## 2019-05-30 NOTE — Progress Notes (Signed)
   Covid-19 Vaccination Clinic  Name:  HADASAH LAURANCE    MRN: [id number] DOB: 07/28/1960  05/30/2019  Ms. Gibbon was observed post Covid-19 immunization for 15 minutes without incidence. She was provided with Vaccine Information Sheet and instruction to access the V-Safe system.   Ms. Parde was instructed to call 911 with any severe reactions post vaccine: Marland Kitchen Difficulty breathing  . Swelling of your face and throat  . A fast heartbeat  . A bad rash all over your body  . Dizziness and weakness    Immunizations Administered    Name Date Dose VIS Date Route   Pfizer COVID-19 Vaccine 05/30/2019 10:16 AM 0.3 mL 03/12/2019 Intramuscular   Manufacturer: [place]   Lot: [id number]   [place]: [id number]

## 2019-06-01 ENCOUNTER — Ambulatory Visit: Payer: BC Managed Care – PPO | Admitting: Family Medicine

## 2019-06-01 ENCOUNTER — Telehealth: Payer: Self-pay

## 2019-06-01 ENCOUNTER — Encounter: Payer: Self-pay | Admitting: Family Medicine

## 2019-06-01 ENCOUNTER — Other Ambulatory Visit: Payer: Self-pay

## 2019-06-01 VITALS — BP 122/80 | HR 80 | Temp 98.0°F | Ht 68.0 in | Wt 233.0 lb

## 2019-06-01 DIAGNOSIS — J0100 Acute maxillary sinusitis, unspecified: Secondary | ICD-10-CM | POA: Diagnosis not present

## 2019-06-01 DIAGNOSIS — T50Z95A Adverse effect of other vaccines and biological substances, initial encounter: Secondary | ICD-10-CM

## 2019-06-01 DIAGNOSIS — E119 Type 2 diabetes mellitus without complications: Secondary | ICD-10-CM | POA: Diagnosis not present

## 2019-06-01 MED ORDER — PEN NEEDLES 3/16" 31G X 5 MM MISC: 1.0000 | Freq: Every day | 0 refills | Status: DC

## 2019-06-01 MED ORDER — AMOXICILLIN 500 MG PO CAPS: 500.0000 mg | ORAL_CAPSULE | Freq: Three times a day (TID) | ORAL | 0 refills | Status: DC

## 2019-06-01 NOTE — Patient Instructions (Signed)

## 2019-06-01 NOTE — Progress Notes (Signed)
Date:  06/01/2019   Name:  Betty Walters   DOB:  [birth year]   MRN:  [id number]   Chief Complaint: Sinusitis (congestion started a week prior to having covid shot, bad headache, took tylenol; and ibue. started coming back shortly after, draining clear mucous when leaning forward, when blowing nose mucous green/yellow, pressuse in eyes, no fever , when she swallows whole head hurts)  Sinusitis This is a new problem. The current episode started in the past 7 days. The problem is unchanged. There has been no fever. Her pain is at a severity of 6/10. The pain is moderate. Associated symptoms include congestion, headaches and sinus pressure. Pertinent negatives include no chills, coughing, diaphoresis, ear pain, hoarse voice, neck pain, shortness of breath, sneezing or sore throat. Past treatments include oral decongestants and spray decongestants (otc pseudofed allergy). The treatment provided mild relief.    Lab Results  Component Value Date   CREATININE 0.71 03/24/2019   BUN 17 03/24/2019   NA 139 03/24/2019   K 4.9 03/24/2019   CL 102 03/24/2019   CO2 23 03/24/2019   Lab Results  Component Value Date   CHOL 214 (H) 03/24/2019   HDL 49 03/24/2019   LDLCALC 132 (H) 03/24/2019   TRIG 187 (H) 03/24/2019   CHOLHDL 4.6 (H) 08/05/2018   No results found for: TSH Lab Results  Component Value Date   HGBA1C 7.3 (H) 03/24/2019     Review of Systems  Constitutional: Negative.  Negative for chills, diaphoresis, fatigue, fever and unexpected weight change.  HENT: Positive for congestion and sinus pressure. Negative for ear discharge, ear pain, hoarse voice, rhinorrhea, sneezing and sore throat.   Eyes: Negative for photophobia, pain, discharge, redness and itching.  Respiratory: Negative for cough, shortness of breath, wheezing and stridor.   Cardiovascular: Negative for chest pain, palpitations and leg swelling.  Gastrointestinal: Negative for abdominal pain, blood in stool,  constipation, diarrhea, nausea and vomiting.  Endocrine: Negative for cold intolerance, heat intolerance, polydipsia, polyphagia and polyuria.  Genitourinary: Negative for dysuria, flank pain, frequency, hematuria, menstrual problem, pelvic pain, urgency, vaginal bleeding and vaginal discharge.  Musculoskeletal: Negative for arthralgias, back pain, myalgias and neck pain.  Skin: Negative for rash.  Allergic/Immunologic: Negative for environmental allergies and food allergies.  Neurological: Positive for headaches. Negative for dizziness, weakness, light-headedness and numbness.  Hematological: Negative for adenopathy. Does not bruise/bleed easily.  Psychiatric/Behavioral: Negative for dysphoric mood. The patient is not nervous/anxious.     Patient Active Problem List   Diagnosis Date Noted  . Heartburn   . Dyspepsia   . Change in bowel habits   . Benign neoplasm of descending colon   . Chronic pain of toes of both feet 09/20/2016  . Type 2 diabetes mellitus with complication ([place]) 0000000  . Allergic rhinitis due to pollen 06/21/2015  . Concussion with coma 10/21/2014  . Clinical depression 09/28/2014  . Diabetes mellitus, type 2 ([place]) 09/28/2014  . HLD (hyperlipidemia) 09/28/2014  . Essential hypertension 09/28/2014  . Adiposity 09/28/2014    Allergies  Allergen Reactions  . Amoxicillin-Pot Clavulanate Nausea And Vomiting    (OK with Amoxicillin alone)    Past Surgical History:  Procedure Laterality Date  . CESAREAN SECTION    . COLONOSCOPY WITH PROPOFOL N/A 04/11/2017   Procedure: COLONOSCOPY WITH colon;  Surgeon: Lucilla Lame, MD;  Location: [facility];  Service: Endoscopy;  Laterality: N/A;  . ESOPHAGOGASTRODUODENOSCOPY (EGD) WITH PROPOFOL N/A 04/11/2017   Procedure: ESOPHAGOGASTRODUODENOSCOPY (EGD)  WITH PROPOFOL;  Surgeon: Lucilla Lame, MD;  Location: [facility];  Service: Endoscopy;  Laterality: N/A;  Diabetic - oral meds  . GALLBLADDER SURGERY    .  KNEE SURGERY    . POLYPECTOMY  04/11/2017   Procedure: POLYPECTOMY INTESTINAL;  Surgeon: Lucilla Lame, MD;  Location: [facility];  Service: Endoscopy;;  . WRIST SURGERY      Social History   Tobacco Use  . Smoking status: Never Smoker  . Smokeless tobacco: Never Used  Substance Use Topics  . Alcohol use: Yes    Alcohol/week: 0.0 standard drinks    Comment: rare - Holidays  . Drug use: No     Medication list has been reviewed and updated.  Current Meds  Medication Sig  . cyclobenzaprine (FLEXERIL) 5 MG tablet Take 5 mg by mouth at bedtime as needed. [place]  . gabapentin (NEURONTIN) 300 MG capsule Take 300 mg by mouth at bedtime. Para March  . Lansoprazole (PREVACID 24HR PO) Take 2 capsules by mouth daily.  Marland Kitchen liraglutide (VICTOZA) 18 MG/3ML SOPN INJECT 1.8MG  SUBCUTANEOUSLY ONCE DAILY  . lisinopril (ZESTRIL) 20 MG tablet Take 1 tablet (20 mg total) by mouth daily.  Marland Kitchen lovastatin (MEVACOR) 40 MG tablet Take 1 tablet (40 mg total) by mouth daily.  . metFORMIN (GLUCOPHAGE) 1000 MG tablet Take 1 tablet (1,000 mg total) by mouth 2 (two) times daily.  . pioglitazone (ACTOS) 15 MG tablet TAKE 1 TABLET BY MOUTH ONCE DAILY -  NEED  APPT  FOR  FURTHER  REFILLS  . sertraline (ZOLOFT) 100 MG tablet TAKE 1 TABLET BY MOUTH ONCE DAILY (ALONG  WITH  50MG   TABLETS  FOR  TDD  150MG )  . sertraline (ZOLOFT) 50 MG tablet TAKE 1 TABLET BY MOUTH ONCE DAILY -  NEED  APPT  FOR  REFILLS    PHQ 2/9 Scores 06/01/2019 03/24/2019 08/05/2018 12/22/2017  PHQ - 2 Score 0 1 4 0  PHQ- 9 Score 4 4 10 3     BP Readings from Last 3 Encounters:  06/01/19 122/80  03/24/19 130/68  08/05/18 130/62    Physical Exam Vitals and nursing note reviewed.  Constitutional:      Appearance: She is well-developed.  HENT:     Head: Normocephalic.     Right Ear: Tympanic membrane, ear canal and external ear normal. There is no impacted cerumen.     Left Ear: Tympanic membrane, ear canal and external ear normal.     Nose:       Right Turbinates: Not swollen.     Left Turbinates: Not swollen.     Right Sinus: Maxillary sinus tenderness present. No frontal sinus tenderness.     Left Sinus: Maxillary sinus tenderness present. No frontal sinus tenderness.     Mouth/Throat:     Lips: Pink.     Mouth: Mucous membranes are moist.  Eyes:     General: Lids are everted, no foreign bodies appreciated. No scleral icterus.       Left eye: No foreign body or hordeolum.     Conjunctiva/sclera: Conjunctivae normal.     Right eye: Right conjunctiva is not injected.     Left eye: Left conjunctiva is not injected.     Pupils: Pupils are equal, round, and reactive to light.  Neck:     Thyroid: No thyromegaly.     Vascular: No JVD.     Trachea: No tracheal deviation.  Cardiovascular:     Rate and Rhythm: Normal rate and regular  rhythm.     Heart sounds: Normal heart sounds. No murmur. No friction rub. No gallop.   Pulmonary:     Effort: Pulmonary effort is normal. No respiratory distress.     Breath sounds: No decreased breath sounds, wheezing, rhonchi or rales.  Abdominal:     General: Bowel sounds are normal.     Palpations: Abdomen is soft. There is no mass.     Tenderness: There is no abdominal tenderness. There is no guarding or rebound.  Musculoskeletal:        General: No tenderness. Normal range of motion.     Cervical back: Normal range of motion and neck supple. No rigidity.  Lymphadenopathy:     Cervical: No cervical adenopathy.     Right cervical: No superficial cervical adenopathy.    Left cervical: No superficial cervical adenopathy.  Skin:    General: Skin is warm.     Findings: No rash.  Neurological:     Mental Status: She is alert and oriented to person, place, and time.     Cranial Nerves: No cranial nerve deficit.     Deep Tendon Reflexes: Reflexes normal.  Psychiatric:        Mood and Affect: Mood is not anxious or depressed.     Wt Readings from Last 3 Encounters:  06/01/19 233 lb  (105.7 kg)  03/24/19 234 lb (106.1 kg)  08/05/18 213 lb (96.6 kg)    BP 122/80   Pulse 80   Temp 98 F (36.7 C) (Oral)   Ht 5\' 8"  (1.727 m)   Wt 233 lb (105.7 kg)   BMI 35.43 kg/m   Assessment and Plan:  1. Acute maxillary sinusitis, recurrence not specified Acute.  New onset.  Stable.  History and physical exam is consistent with an acute maxillary sinusitis.  Will initiate as previously treated with amoxicillin 500 mg 3 times a day for 10 days. - amoxicillin (AMOXIL) 500 MG capsule; Take 1 capsule (500 mg total) by mouth 3 (three) times daily.  Dispense: 30 capsule; Refill: 0    3. Side effects of vaccination, initial encounter Patient had a recent vaccination with Covid and has noted headache with generalized myalgias and fatigue.  There has been no fever, neurologic symptoms, nor persistence of current symptoms.  And patient has encouraged that if no other symptoms develop to proceed with the second dose.  4. Type 2 diabetes mellitus without complication, without long-term current use of insulin ([place]) Patient had ultrafine needles called in for use with her Victoza.

## 2019-06-01 NOTE — Telephone Encounter (Signed)
Pt coming in

## 2019-06-21 ENCOUNTER — Ambulatory Visit: Payer: BC Managed Care – PPO | Attending: Internal Medicine

## 2019-06-21 DIAGNOSIS — Z23 Encounter for immunization: Secondary | ICD-10-CM

## 2019-06-21 NOTE — Progress Notes (Signed)
   Covid-19 Vaccination Clinic  Name:  Betty Walters    MRN: [id number] DOB: [birth year]  06/21/2019  [name] was observed post Covid-19 immunization for 15 minutes without incident. She was provided with Vaccine Information Sheet and instruction to access the V-Safe system.   [name] was instructed to call 911 with any severe reactions post vaccine: Marland Kitchen Difficulty breathing  . Swelling of face and throat  . A fast heartbeat  . A bad rash all over body  . Dizziness and weakness   Immunizations Administered    Name Date Dose VIS Date Route   Pfizer COVID-19 Vaccine 06/21/2019 10:31 AM 0.3 mL 03/12/2019 Intramuscular   Manufacturer: [place]   Lot: [id number]   [place]: [id number]

## 2019-08-03 ENCOUNTER — Ambulatory Visit: Payer: BC Managed Care – PPO | Admitting: Family Medicine

## 2019-08-06 ENCOUNTER — Other Ambulatory Visit: Payer: Self-pay | Admitting: Family Medicine

## 2019-08-06 ENCOUNTER — Telehealth: Payer: Self-pay | Admitting: Family Medicine

## 2019-08-06 DIAGNOSIS — E119 Type 2 diabetes mellitus without complications: Secondary | ICD-10-CM

## 2019-08-06 NOTE — Telephone Encounter (Signed)
Medication Refill - Medication:  liraglutide ([place]) 18 MG/3ML SOPN [id number]   Preferred Pharmacy (with phone number or street name):  [facility] [place] [facility] [address] [place] [address]  Phone: [phone] Fax: [phone]     Agent: Please be advised that RX refills may take up to 3 business days. We ask that you follow-up with your pharmacy.

## 2019-08-09 ENCOUNTER — Other Ambulatory Visit: Payer: Self-pay

## 2019-08-09 NOTE — Telephone Encounter (Signed)
Added victoza to list

## 2019-08-09 NOTE — Progress Notes (Unsigned)
Added victoza to list

## 2019-08-20 ENCOUNTER — Telehealth: Payer: Self-pay | Admitting: Family Medicine

## 2019-08-20 NOTE — Telephone Encounter (Signed)
Pt called concerning daughter getting new patient apt- I looked and first new pt appt is June 15- she said her daughter will call back with what to do

## 2019-08-20 NOTE — Telephone Encounter (Unsigned)
Copied from [place] [phone]. Topic: General - Inquiry >> Aug 19, 2019  4:43 PM Alease Frame wrote: Reason for CRM: Pt called wanting a call back from [place]. Pt wouldn't state why . She just explained she needed to ask a question . Please advise

## 2019-08-23 ENCOUNTER — Encounter: Payer: Self-pay | Admitting: Family Medicine

## 2019-08-23 ENCOUNTER — Ambulatory Visit: Payer: BC Managed Care – PPO | Admitting: Family Medicine

## 2019-08-23 ENCOUNTER — Other Ambulatory Visit: Payer: Self-pay

## 2019-08-23 VITALS — BP 100/60 | HR 72 | Ht 68.0 in | Wt 225.0 lb

## 2019-08-23 DIAGNOSIS — E782 Mixed hyperlipidemia: Secondary | ICD-10-CM | POA: Diagnosis not present

## 2019-08-23 DIAGNOSIS — E119 Type 2 diabetes mellitus without complications: Secondary | ICD-10-CM

## 2019-08-23 DIAGNOSIS — I10 Essential (primary) hypertension: Secondary | ICD-10-CM

## 2019-08-23 DIAGNOSIS — F3341 Major depressive disorder, recurrent, in partial remission: Secondary | ICD-10-CM

## 2019-08-23 MED ORDER — LOVASTATIN 40 MG PO TABS: 40.0000 mg | ORAL_TABLET | Freq: Every day | ORAL | 5 refills | Status: DC

## 2019-08-23 MED ORDER — VICTOZA 18 MG/3ML ~~LOC~~ SOPN: 1.8000 mg | PEN_INJECTOR | Freq: Every day | SUBCUTANEOUS | 2 refills | Status: DC

## 2019-08-23 MED ORDER — PIOGLITAZONE HCL 15 MG PO TABS: ORAL_TABLET | ORAL | 5 refills | Status: DC

## 2019-08-23 MED ORDER — LISINOPRIL 20 MG PO TABS: 20.0000 mg | ORAL_TABLET | Freq: Every day | ORAL | 5 refills | Status: DC

## 2019-08-23 MED ORDER — SERTRALINE HCL 50 MG PO TABS: ORAL_TABLET | ORAL | 5 refills | Status: DC

## 2019-08-23 MED ORDER — METFORMIN HCL 1000 MG PO TABS: 1000.0000 mg | ORAL_TABLET | Freq: Two times a day (BID) | ORAL | 5 refills | Status: DC

## 2019-08-23 MED ORDER — SERTRALINE HCL 100 MG PO TABS: ORAL_TABLET | ORAL | 5 refills | Status: DC

## 2019-08-23 NOTE — Progress Notes (Signed)
Date:  08/23/2019   Name:  Betty Walters   DOB:  June 29, 1960   MRN:  [id number]   Chief Complaint: Hypertension, Hyperlipidemia, Diabetes, and Depression  Hypertension This is a chronic problem. The current episode started more than 1 year ago. The problem has been gradually improving since onset. The problem is controlled. Pertinent negatives include no anxiety, blurred vision, chest pain, headaches, malaise/fatigue, neck pain, orthopnea, palpitations, peripheral edema, PND, shortness of breath or sweats. There are no associated agents to hypertension. There are no known risk factors for coronary artery disease. Past treatments include ACE inhibitors. The current treatment provides moderate improvement. There are no compliance problems.  There is no history of angina, kidney disease, CAD/MI, CVA, heart failure, left ventricular hypertrophy, PVD or retinopathy. There is no history of chronic renal disease, a hypertension causing med or renovascular disease.  Hyperlipidemia This is a chronic problem. The current episode started more than 1 year ago. The problem is controlled. Recent lipid tests were reviewed and are normal. She has no history of chronic renal disease, diabetes, hypothyroidism, liver disease, obesity or nephrotic syndrome. Pertinent negatives include no chest pain, focal sensory loss, focal weakness, leg pain, myalgias or shortness of breath. Current antihyperlipidemic treatment includes statins. The current treatment provides moderate improvement of lipids. There are no compliance problems.  Risk factors for coronary artery disease include dyslipidemia, hypertension and diabetes mellitus.  Diabetes She presents for her follow-up diabetic visit. She has type 2 diabetes mellitus. Her disease course has been stable. Pertinent negatives for hypoglycemia include no confusion, dizziness, headaches, hunger, mood changes, nervousness/anxiousness, pallor, seizures, sleepiness, speech  difficulty, sweats or tremors. Pertinent negatives for diabetes include no blurred vision, no chest pain, no fatigue, no foot paresthesias, no foot ulcerations, no polydipsia, no polyphagia, no polyuria, no visual change, no weakness and no weight loss. There are no hypoglycemic complications. Symptoms are stable. There are no diabetic complications. Pertinent negatives for diabetic complications include no autonomic neuropathy, CVA, heart disease, nephropathy, peripheral neuropathy, PVD or retinopathy. Current diabetic treatment includes oral agent (dual therapy) (with victoza). Her weight is stable. She is following a generally healthy diet. Her home blood glucose trend is fluctuating minimally. Her breakfast blood glucose range is generally 130-140 mg/dl. An ACE inhibitor/angiotensin II receptor blocker is being taken.  Depression        This is a chronic problem.  The current episode started more than 1 year ago.   The onset quality is gradual.   Associated symptoms include no decreased concentration, no fatigue, no helplessness, no hopelessness, does not have insomnia, not irritable, no restlessness, no decreased interest, no appetite change, no body aches, no myalgias, no headaches, no indigestion, not sad and no suicidal ideas.  Past treatments include SSRIs - Selective serotonin reuptake inhibitors.  Compliance with treatment is variable.   Pertinent negatives include no hypothyroidism and no anxiety.   Lab Results  Component Value Date   CREATININE 0.71 03/24/2019   BUN 17 03/24/2019   NA 139 03/24/2019   K 4.9 03/24/2019   CL 102 03/24/2019   CO2 23 03/24/2019   Lab Results  Component Value Date   CHOL 214 (H) 03/24/2019   HDL 49 03/24/2019   LDLCALC 132 (H) 03/24/2019   TRIG 187 (H) 03/24/2019   CHOLHDL 4.6 (H) 08/05/2018   No results found for: TSH Lab Results  Component Value Date   HGBA1C 7.3 (H) 03/24/2019   Lab Results  Component Value Date  WBC 9.7 02/05/2017   HGB  13.1 02/05/2017   HCT 38.9 02/05/2017   MCV 86.1 02/05/2017   PLT 301 02/05/2017   Lab Results  Component Value Date   ALT 18 03/24/2019   AST 12 03/24/2019   ALKPHOS 117 03/24/2019   BILITOT 0.3 03/24/2019     Review of Systems  Constitutional: Negative.  Negative for appetite change, chills, fatigue, fever, malaise/fatigue, unexpected weight change and weight loss.  HENT: Negative for congestion, ear discharge, ear pain, rhinorrhea, sinus pressure, sneezing and sore throat.   Eyes: Negative for blurred vision, photophobia, pain, discharge, redness and itching.  Respiratory: Negative for cough, shortness of breath, wheezing and stridor.   Cardiovascular: Negative for chest pain, palpitations, orthopnea and PND.  Gastrointestinal: Negative for abdominal pain, blood in stool, constipation, diarrhea, nausea and vomiting.  Endocrine: Negative for cold intolerance, heat intolerance, polydipsia, polyphagia and polyuria.  Genitourinary: Negative for dysuria, flank pain, frequency, hematuria, menstrual problem, pelvic pain, urgency, vaginal bleeding and vaginal discharge.  Musculoskeletal: Negative for arthralgias, back pain, myalgias and neck pain.  Skin: Negative for pallor and rash.  Allergic/Immunologic: Negative for environmental allergies and food allergies.  Neurological: Negative for dizziness, tremors, focal weakness, seizures, speech difficulty, weakness, light-headedness, numbness and headaches.       Restless leg  Hematological: Negative for adenopathy. Does not bruise/bleed easily.  Psychiatric/Behavioral: Positive for depression. Negative for confusion, decreased concentration, dysphoric mood and suicidal ideas. The patient is not nervous/anxious and does not have insomnia.     Patient Active Problem List   Diagnosis Date Noted   Heartburn    Dyspepsia    Change in bowel habits    Benign neoplasm of descending colon    Chronic pain of toes of both feet 09/20/2016    Type 2 diabetes mellitus with complication ([place]) 0000000   Allergic rhinitis due to pollen 06/21/2015   Concussion with coma 10/21/2014   Clinical depression 09/28/2014   Diabetes mellitus, type 2 ([place]) 09/28/2014   HLD (hyperlipidemia) 09/28/2014   Essential hypertension 09/28/2014   Adiposity 09/28/2014    Allergies  Allergen Reactions   Amoxicillin-Pot Clavulanate Nausea And Vomiting    (OK with Amoxicillin alone)    Past Surgical History:  Procedure Laterality Date   CESAREAN SECTION     COLONOSCOPY WITH PROPOFOL N/A 04/11/2017   Procedure: COLONOSCOPY WITH colon;  Surgeon: Lucilla Lame, MD;  Location: [facility];  Service: Endoscopy;  Laterality: N/A;   ESOPHAGOGASTRODUODENOSCOPY (EGD) WITH PROPOFOL N/A 04/11/2017   Procedure: ESOPHAGOGASTRODUODENOSCOPY (EGD) WITH PROPOFOL;  Surgeon: Lucilla Lame, MD;  Location: [facility];  Service: Endoscopy;  Laterality: N/A;  Diabetic - oral meds   GALLBLADDER SURGERY     KNEE SURGERY     POLYPECTOMY  04/11/2017   Procedure: POLYPECTOMY INTESTINAL;  Surgeon: Lucilla Lame, MD;  Location: [facility];  Service: Endoscopy;;   WRIST SURGERY      Social History   Tobacco Use   Smoking status: Never Smoker   Smokeless tobacco: Never Used  Substance Use Topics   Alcohol use: Yes    Alcohol/week: 0.0 standard drinks    Comment: rare - Holidays   Drug use: No     Medication list has been reviewed and updated.  Current Meds  Medication Sig   celecoxib (CELEBREX) 200 MG capsule Take 200 mg by mouth daily. ortho   cyclobenzaprine (FLEXERIL) 5 MG tablet Take 5 mg by mouth at bedtime as needed. [place]   gabapentin (NEURONTIN)  300 MG capsule Take 300 mg by mouth at bedtime. [place]   Insulin Pen Needle (PEN NEEDLES 3/16") 31G X 5 MM MISC 1 each by Does not apply route daily.   Lansoprazole (PREVACID 24HR PO) Take 2 capsules by mouth daily. otc   liraglutide (VICTOZA) 18 MG/3ML SOPN 1.8 mg  daily.   lisinopril (ZESTRIL) 20 MG tablet Take 1 tablet (20 mg total) by mouth daily.   lovastatin (MEVACOR) 40 MG tablet Take 1 tablet (40 mg total) by mouth daily.   metFORMIN (GLUCOPHAGE) 1000 MG tablet Take 1 tablet (1,000 mg total) by mouth 2 (two) times daily.   pioglitazone (ACTOS) 15 MG tablet TAKE 1 TABLET BY MOUTH ONCE DAILY -  NEED  APPT  FOR  FURTHER  REFILLS   Probiotic Product (PROBIOTIC ADVANCED) CAPS Take 1 capsule by mouth daily.   sertraline (ZOLOFT) 100 MG tablet TAKE 1 TABLET BY MOUTH ONCE DAILY (ALONG  WITH  50MG   TABLETS  FOR  TDD  150MG )   sertraline (ZOLOFT) 50 MG tablet TAKE 1 TABLET BY MOUTH ONCE DAILY -  NEED  APPT  FOR  REFILLS    PHQ 2/9 Scores 08/23/2019 06/01/2019 03/24/2019 08/05/2018  PHQ - 2 Score 0 0 1 4  PHQ- 9 Score 3 4 4 10     BP Readings from Last 3 Encounters:  08/23/19 100/60  06/01/19 122/80  03/24/19 130/68    Physical Exam Vitals and nursing note reviewed.  Constitutional:      General: She is not irritable.    Appearance: She is well-developed.  HENT:     Head: Normocephalic.     Right Ear: Tympanic membrane, ear canal and external ear normal. There is no impacted cerumen.     Left Ear: Tympanic membrane, ear canal and external ear normal. There is no impacted cerumen.     Nose: Nose normal.     Mouth/Throat:     Mouth: Mucous membranes are moist.  Eyes:     General: Lids are everted, no foreign bodies appreciated. No scleral icterus.       Left eye: No foreign body or hordeolum.     Conjunctiva/sclera: Conjunctivae normal.     Right eye: Right conjunctiva is not injected.     Left eye: Left conjunctiva is not injected.     Pupils: Pupils are equal, round, and reactive to light.  Neck:     Thyroid: No thyromegaly.     Vascular: No JVD.     Trachea: No tracheal deviation.  Cardiovascular:     Rate and Rhythm: Normal rate and regular rhythm.     Pulses:          Dorsalis pedis pulses are 2+ on the right side and 2+ on the  left side.       Posterior tibial pulses are 2+ on the right side and 2+ on the left side.     Heart sounds: Normal heart sounds. No murmur. No friction rub. No gallop.   Pulmonary:     Effort: Pulmonary effort is normal. No respiratory distress.     Breath sounds: Normal breath sounds. No wheezing or rales.  Abdominal:     General: Bowel sounds are normal.     Palpations: Abdomen is soft. There is no mass.     Tenderness: There is no abdominal tenderness. There is no guarding or rebound.  Musculoskeletal:        General: No tenderness. Normal range of motion.  Cervical back: Normal range of motion and neck supple.     Right foot: Normal range of motion.     Left foot: Normal range of motion.  Feet:     Right foot:     Protective Sensation: 10 sites tested. 10 sites sensed.     Skin integrity: Dry skin present. No ulcer, blister, skin breakdown, erythema, callus or fissure.     Toenail Condition: Right toenails are normal.     Left foot:     Protective Sensation: 10 sites tested. 10 sites sensed.     Skin integrity: Dry skin present. No ulcer, blister, skin breakdown, erythema or fissure.     Toenail Condition: Left toenails are normal.  Lymphadenopathy:     Cervical: No cervical adenopathy.  Skin:    General: Skin is warm.     Capillary Refill: Capillary refill takes less than 2 seconds.     Findings: No rash.  Neurological:     General: No focal deficit present.     Mental Status: She is alert and oriented to person, place, and time.     Cranial Nerves: No cranial nerve deficit.     Deep Tendon Reflexes: Reflexes normal.  Psychiatric:        Mood and Affect: Mood is not anxious or depressed.     Wt Readings from Last 3 Encounters:  08/23/19 225 lb (102.1 kg)  06/01/19 233 lb (105.7 kg)  03/24/19 234 lb (106.1 kg)    BP 100/60    Pulse 72    Ht 5\' 8"  (1.727 m)    Wt 225 lb (102.1 kg)    BMI 34.21 kg/m   Assessment and Plan:  1. Essential hypertension Chronic.   Controlled.  Stable.  Continue lisinopril 20 mg once a day.  Will check renal function panel to evaluate electrolytes and GFR status. - lisinopril (ZESTRIL) 20 MG tablet; Take 1 tablet (20 mg total) by mouth daily.  Dispense: 30 tablet; Refill: 5 - Renal Function Panel  2. Mixed hyperlipidemia Chronic.  Controlled.  Stable.  Continue lovastatin 40 mg once a day.  Will check lipid panel with ratio. - lovastatin (MEVACOR) 40 MG tablet; Take 1 tablet (40 mg total) by mouth daily.  Dispense: 30 tablet; Refill: 5 - Lipid Panel With LDL/HDL Ratio  3. Type 2 diabetes mellitus without complication, without long-term current use of insulin ([facility]) Chronic.  Controlled.  Stable.  Patient is currently going to be continuing on Metformin 1 g twice a day pioglitazone 15 mg once a day and Victoza 1.8 mg daily.  We will recheck A1c and renal function panel.  Foot exam is normal. - metFORMIN (GLUCOPHAGE) 1000 MG tablet; Take 1 tablet (1,000 mg total) by mouth 2 (two) times daily.  Dispense: 60 tablet; Refill: 5 - pioglitazone (ACTOS) 15 MG tablet; TAKE 1 TABLET BY MOUTH ONCE DAILY  Dispense: 30 tablet; Refill: 5 - HgB A1c - Renal Function Panel  4. Recurrent major depressive disorder, in partial remission ([facility]) Chronic.  Controlled.  Stable.  PHQ is 3 with a gad score of 10.  Gad score is probably elevated because her father is in the hospital but is being discharged today.  Patient will continue 150 mg of Zoloft daily at current dosing and will recheck in 6 months. - sertraline (ZOLOFT) 100 MG tablet; TAKE 1 TABLET BY MOUTH ONCE DAILY (ALONG  WITH  50MG   TABLETS  FOR  TDD  150MG )  Dispense: 30 tablet; Refill: 5 -  sertraline (ZOLOFT) 50 MG tablet; TAKE 1 TABLET BY MOUTH ONCE DAILY -  Dispense: 30 tablet; Refill: 5

## 2019-08-24 LAB — HEMOGLOBIN A1C
Est. average glucose Bld gHb Est-mCnc: 148 mg/dL
Hgb A1c MFr Bld: 6.8 % — ABNORMAL HIGH (ref 4.8–5.6)

## 2019-08-24 LAB — RENAL FUNCTION PANEL
Albumin: 4.5 g/dL (ref 3.8–4.9)
BUN/Creatinine Ratio: 38 — ABNORMAL HIGH (ref 9–23)
BUN: 29 mg/dL — ABNORMAL HIGH (ref 6–24)
CO2: 20 mmol/L (ref 20–29)
Calcium: 9.5 mg/dL (ref 8.7–10.2)
Chloride: 102 mmol/L (ref 96–106)
Creatinine, Ser: 0.77 mg/dL (ref 0.57–1.00)
GFR calc Af Amer: 100 mL/min/{1.73_m2} (ref >59)
GFR calc non Af Amer: 87 mL/min/{1.73_m2} (ref >59)
Glucose: 111 mg/dL — ABNORMAL HIGH (ref 65–99)
Phosphorus: 3.4 mg/dL (ref 3.0–4.3)
Potassium: 4.4 mmol/L (ref 3.5–5.2)
Sodium: 136 mmol/L (ref 134–144)

## 2019-08-24 LAB — LIPID PANEL WITH LDL/HDL RATIO
Cholesterol, Total: 189 mg/dL (ref 100–199)
HDL: 56 mg/dL (ref >39)
LDL Chol Calc (NIH): 111 mg/dL — ABNORMAL HIGH (ref 0–99)
LDL/HDL Ratio: 2 ratio (ref 0.0–3.2)
Triglycerides: 124 mg/dL (ref 0–149)
VLDL Cholesterol Cal: 22 mg/dL (ref 5–40)

## 2019-09-24 ENCOUNTER — Encounter: Payer: BC Managed Care – PPO | Admitting: Family Medicine

## 2019-12-08 ENCOUNTER — Ambulatory Visit: Payer: BC Managed Care – PPO | Admitting: Family Medicine

## 2020-01-11 ENCOUNTER — Telehealth: Payer: Self-pay

## 2020-01-11 ENCOUNTER — Other Ambulatory Visit: Payer: Self-pay | Admitting: Family Medicine

## 2020-01-11 NOTE — Telephone Encounter (Signed)
Copied from [place] [id number]. Topic: General - Call Back - No Documentation >> Jan 11, 2020  3:30 PM Erick Blinks wrote: Reason for CRM: Pt believes she has a UTI, wants a call back from [place]. Pt declined appt.   Best contact: [phone]

## 2020-01-12 NOTE — Telephone Encounter (Signed)
Please call pt back and tell her that Ronnald Ramp will need to see her and get urine sample in order to treat. Please schedule appt

## 2020-02-23 ENCOUNTER — Other Ambulatory Visit: Payer: Self-pay | Admitting: Family Medicine

## 2020-02-23 NOTE — Telephone Encounter (Signed)
Attempted to call patient to schedule follow up appointment- left message to call office-courtesy refill given

## 2020-03-01 ENCOUNTER — Ambulatory Visit
Admission: RE | Admit: 2020-03-01 | Discharge: 2020-03-01 | Disposition: A | Discharge status: Home / Self Care | Payer: BC Managed Care – PPO | Source: Ambulatory Visit | Attending: Physician Assistant | Admitting: Physician Assistant

## 2020-03-01 ENCOUNTER — Telehealth: Payer: Self-pay

## 2020-03-01 ENCOUNTER — Other Ambulatory Visit: Payer: Self-pay

## 2020-03-01 VITALS — BP 129/68 | HR 76 | Temp 98.2°F | Resp 18 | Ht 68.0 in | Wt 220.0 lb

## 2020-03-01 DIAGNOSIS — J019 Acute sinusitis, unspecified: Secondary | ICD-10-CM

## 2020-03-01 DIAGNOSIS — R059 Cough, unspecified: Secondary | ICD-10-CM

## 2020-03-01 DIAGNOSIS — R0981 Nasal congestion: Secondary | ICD-10-CM

## 2020-03-01 MED ORDER — CEFDINIR 300 MG PO CAPS: 300.0000 mg | ORAL_CAPSULE | Freq: Two times a day (BID) | ORAL | 0 refills | Status: AC

## 2020-03-01 MED ORDER — DM-GUAIFENESIN ER 30-600 MG PO TB12: 1.0000 | ORAL_TABLET | Freq: Two times a day (BID) | ORAL | 0 refills | Status: AC

## 2020-03-01 MED ORDER — FLUTICASONE PROPIONATE 50 MCG/ACT NA SUSP: 2.0000 | Freq: Every day | NASAL | 0 refills | Status: DC

## 2020-03-01 NOTE — ED Triage Notes (Signed)
Patient in today c/o cough x 4-5 days, nasal congestion x 1 week, sore throat and headache x 3 days. Patient states she has had chills, but hasn't taken her temperature. Patient has taken OTC Sudafed, Dayquil/Nyquil without relief. Patient has completed the covid vaccines. Patient has a PCR covid test yesterday that was negative. Patient states the sore throat she feels is from drainage.

## 2020-03-01 NOTE — Telephone Encounter (Unsigned)
Copied from [place] [phone]. Topic: Appointment Scheduling - Scheduling Inquiry for Clinic >> Mar 01, 2020  8:44 AM Oneta Rack wrote: Patient seeking an appointment for cough, congestion sore throat for 3x days. Patient received negative COVID symptoms yesterday.  Best contact # [phone]

## 2020-03-01 NOTE — Discharge Instructions (Signed)
You likely have a viral upper respiratory infection.  As discussed most colds typically last between 7 to 10 days.  You need to wait at least 3 more days before beginning antibiotic if you fill the prescription.  I am confident that you should be feeling much better on your own with the oral and nasal decongestants and increase your fluid intake and rest.

## 2020-03-01 NOTE — Telephone Encounter (Signed)
Pt will go to urgent care since no appt for today and cannot miss another day of work

## 2020-03-01 NOTE — ED Provider Notes (Signed)
[facility]    CSN: [id number] Arrival date & time: 03/01/20  1349      History   Chief Complaint Chief Complaint  Patient presents with  . Appointment  . Cough  . Nasal Congestion  . Sore Throat  . Headache  . Otalgia    HPI Betty Walters is a 59 y.o. female presenting for 1 week history of nasal congestion, sore throat, and headaches. She says that over the past 4 days, she has developed a cough and yellowish nasal drainage.  Denies fever but admits to chills. She says she has been taking OTC Sudafed and cough syrups without relief.  She says she does not use nasal sprays because they do not work for her.  Denies known COVID exposure and is fully vaccinated. States she had a negative PCR COVID test yesterday.  Patient believes she has a sinus infection.  She states that she normally has to have a antibiotic to get over symptoms.  Patient's last antibiotic for sinus infection was 8 months ago.  Past medical history significant for diabetes, hypertension, and hyperlipidemia.  No other concerns today.  HPI  Past Medical History:  Diagnosis Date  . Concussion May 2016  . Depression   . Diabetes mellitus without complication ([place])   . Family history of adverse reaction to anesthesia    Father - slow to wake  . GERD (gastroesophageal reflux disease)   . High cholesterol   . Hypertension   . Yeast infection     Patient Active Problem List   Diagnosis Date Noted  . Heartburn   . Dyspepsia   . Change in bowel habits   . Benign neoplasm of descending colon   . Chronic pain of toes of both feet 09/20/2016  . Type 2 diabetes mellitus with complication ([place]) 02/63/7858  . Allergic rhinitis due to pollen 06/21/2015  . Concussion with coma 10/21/2014  . Clinical depression 09/28/2014  . Diabetes mellitus, type 2 ([place]) 09/28/2014  . HLD (hyperlipidemia) 09/28/2014  . Essential hypertension 09/28/2014  . Adiposity 09/28/2014    Past Surgical History:  Procedure  Laterality Date  . CESAREAN SECTION    . COLONOSCOPY WITH PROPOFOL N/A 04/11/2017   Procedure: COLONOSCOPY WITH colon;  Surgeon: Lucilla Lame, MD;  Location: [place];  Service: Endoscopy;  Laterality: N/A;  . ESOPHAGOGASTRODUODENOSCOPY (EGD) WITH PROPOFOL N/A 04/11/2017   Procedure: ESOPHAGOGASTRODUODENOSCOPY (EGD) WITH PROPOFOL;  Surgeon: Lucilla Lame, MD;  Location: [place];  Service: Endoscopy;  Laterality: N/A;  Diabetic - oral meds  . GALLBLADDER SURGERY    . KNEE SURGERY    . POLYPECTOMY  04/11/2017   Procedure: POLYPECTOMY INTESTINAL;  Surgeon: Lucilla Lame, MD;  Location: [place];  Service: Endoscopy;;  . WRIST SURGERY      OB History   No obstetric history on file.      Home Medications    Prior to Admission medications   Medication Sig Start Date End Date Taking? Authorizing Provider  celecoxib (CELEBREX) 200 MG capsule Take 200 mg by mouth daily. ortho 11/12/17  Yes [provider]  cyclobenzaprine (FLEXERIL) 5 MG tablet Take 5 mg by mouth at bedtime as needed. Para March 03/09/19  Yes [provider]  gabapentin (NEURONTIN) 300 MG capsule Take 300 mg by mouth at bedtime. Para March 03/05/19  Yes [provider]  Insulin Pen Needle (PEN NEEDLES 3/16") 31G X 5 MM MISC 1 each by Does not apply route daily. 06/01/19  Yes Ronnald Ramp,  Deanna C, MD  Lansoprazole (PREVACID 24HR PO) Take 2 capsules by mouth daily. otc   Yes [provider]  lisinopril (ZESTRIL) 20 MG tablet Take 1 tablet (20 mg total) by mouth daily. 08/23/19  Yes Juline Patch, MD  lovastatin (MEVACOR) 40 MG tablet Take 1 tablet (40 mg total) by mouth daily. 08/23/19  Yes Juline Patch, MD  metFORMIN (GLUCOPHAGE) 1000 MG tablet Take 1 tablet (1,000 mg total) by mouth 2 (two) times daily. 08/23/19  Yes Juline Patch, MD  pioglitazone (ACTOS) 15 MG tablet TAKE 1 TABLET BY MOUTH ONCE DAILY 08/23/19  Yes Juline Patch, MD  Probiotic Product (PROBIOTIC ADVANCED)  CAPS Take 1 capsule by mouth daily.   Yes [provider]  sertraline (ZOLOFT) 100 MG tablet TAKE 1 TABLET BY MOUTH ONCE DAILY (ALONG  WITH  50MG   TABLETS  FOR  TDD  150MG ) 08/23/19  Yes Juline Patch, MD  sertraline (ZOLOFT) 50 MG tablet TAKE 1 TABLET BY MOUTH ONCE DAILY - 08/23/19  Yes Juline Patch, MD  VICTOZA 18 MG/3ML SOPN INJECT 1.8MG  INTO THE SKIN ONCE DAILY 02/23/20  Yes Juline Patch, MD  cefdinir (OMNICEF) 300 MG capsule Take 1 capsule (300 mg total) by mouth 2 (two) times daily for 7 days. 03/01/20 03/08/20  Danton Clap, PA-C  dextromethorphan-guaiFENesin (MUCINEX DM) 30-600 MG 12hr tablet Take 1 tablet by mouth 2 (two) times daily for 7 days. 03/01/20 03/08/20  Laurene Footman B, PA-C  fluticasone (FLONASE) 50 MCG/ACT nasal spray Place 2 sprays into both nostrils daily for 7 days. 03/01/20 03/08/20  Danton Clap, PA-C    Family History Family History  Problem Relation Age of Onset  . Cancer Mother        breast  . Diabetes Father     Social History Social History   Tobacco Use  . Smoking status: Never Smoker  . Smokeless tobacco: Never Used  Vaping Use  . Vaping Use: Never used  Substance Use Topics  . Alcohol use: Yes    Alcohol/week: 0.0 standard drinks    Comment: rare - Holidays  . Drug use: No     Allergies   Amoxicillin-pot clavulanate   Review of Systems Review of Systems  Constitutional: Positive for fatigue. Negative for chills, diaphoresis and fever.  HENT: Positive for congestion, ear pain (right), postnasal drip, rhinorrhea, sinus pressure, sinus pain and sore throat.   Respiratory: Positive for cough. Negative for shortness of breath.   Gastrointestinal: Negative for abdominal pain, nausea and vomiting.  Musculoskeletal: Negative for arthralgias and myalgias.  Skin: Negative for rash.  Neurological: Positive for headaches. Negative for weakness.  Hematological: Negative for adenopathy.     Physical Exam Triage Vital Signs ED  Triage Vitals  Enc Vitals Group     BP 03/01/20 1419 129/68     Pulse Rate 03/01/20 1419 76     Resp 03/01/20 1419 18     Temp 03/01/20 1419 98.2 F (36.8 C)     Temp Source 03/01/20 1419 Oral     SpO2 03/01/20 1419 99 %     Weight 03/01/20 1418 220 lb (99.8 kg)     Height 03/01/20 1418 5\' 8"  (1.727 m)     Head Circumference --      Peak Flow --      Pain Score 03/01/20 1418 5     Pain Loc --      Pain Edu? --  Excl. in GC? --    No data found.  Updated Vital Signs BP 129/68 (BP Location: Left Arm)   Pulse 76   Temp 98.2 F (36.8 C) (Oral)   Resp 18   Ht 5\' 8"  (1.727 m)   Wt 220 lb (99.8 kg)   SpO2 99%   BMI 33.45 kg/m    Physical Exam Vitals and nursing note reviewed.  Constitutional:      General: She is not in acute distress.    Appearance: Normal appearance. She is not ill-appearing or toxic-appearing.  HENT:     Head: Normocephalic and atraumatic.     Right Ear: Tympanic membrane, ear canal and external ear normal.     Left Ear: Tympanic membrane, ear canal and external ear normal.     Nose: Congestion (mild) and rhinorrhea (trace light yellow drainage) present.     Mouth/Throat:     Mouth: Mucous membranes are moist.     Pharynx: Oropharynx is clear. Posterior oropharyngeal erythema (moderate clear PND) present.  Eyes:     General: No scleral icterus.       Right eye: No discharge.        Left eye: No discharge.     Conjunctiva/sclera: Conjunctivae normal.  Cardiovascular:     Rate and Rhythm: Normal rate and regular rhythm.     Heart sounds: Normal heart sounds.  Pulmonary:     Effort: Pulmonary effort is normal. No respiratory distress.     Breath sounds: Normal breath sounds.  Musculoskeletal:     Cervical back: Neck supple.  Skin:    General: Skin is dry.  Neurological:     General: No focal deficit present.     Mental Status: She is alert. Mental status is at baseline.     Motor: No weakness.     Gait: Gait normal.  Psychiatric:         Mood and Affect: Mood normal.        Behavior: Behavior normal.        Thought Content: Thought content normal.      UC Treatments / Results  Labs (all labs ordered are listed, but only abnormal results are displayed) Labs Reviewed - No data to display  EKG   Radiology No results found.  Procedures Procedures (including critical care time)  Medications Ordered in UC Medications - No data to display  Initial Impression / Assessment and Plan / UC Course  I have reviewed the triage vital signs and the nursing notes.  Pertinent labs & imaging results that were available during my care of the patient were reviewed by me and considered in my medical decision making (see chart for details).   59 year old female presenting for 7-day history of sinus congestion and headaches.  Also admits to cough.  She says she is tried multiple over-the-counter medications without relief of symptoms.  Patient states she believes she needs antibiotic.  I discussed with her that she should wait at least 10 days before starting antibiotic and that is our general policy.  Advised her she likely has a viral infection should get better on her own over the next 3 days or so.  Advised her to increase rest and fluids.  I have sent the 60 and Flonase to the pharmacy for her.  She declines another Covid test since she just had a negative PCR test yesterday.  I have printed a safety prescription for patient to start cefdinir if she is not feeling better in  the next 3 days.  Advised her to definitely wait those 3 days as I am confident she will not need the antibiotic prescription filled.   Final Clinical Impressions(s) / UC Diagnoses   Final diagnoses:  Acute sinusitis, recurrence not specified, unspecified location  Nasal congestion  Cough     Discharge Instructions     You likely have a viral upper respiratory infection.  As discussed most colds typically last between 7 to 10 days.  You need to wait at  least 3 more days before beginning antibiotic if you fill the prescription.  I am confident that you should be feeling much better on your own with the oral and nasal decongestants and increase your fluid intake and rest.    ED Prescriptions    Medication Sig Dispense Auth. Provider   dextromethorphan-guaiFENesin (MUCINEX DM) 30-600 MG 12hr tablet Take 1 tablet by mouth 2 (two) times daily for 7 days. 14 tablet Laurene Footman B, PA-C   fluticasone (FLONASE) 50 MCG/ACT nasal spray Place 2 sprays into both nostrils daily for 7 days. 1 g Laurene Footman B, PA-C   cefdinir (OMNICEF) 300 MG capsule Take 1 capsule (300 mg total) by mouth 2 (two) times daily for 7 days. 14 capsule Danton Clap, PA-C     PDMP not reviewed this encounter.   Danton Clap, PA-C 03/01/20 1534

## 2020-03-09 LAB — HM DIABETES EYE EXAM

## 2020-03-15 ENCOUNTER — Other Ambulatory Visit: Payer: Self-pay | Admitting: Family Medicine

## 2020-03-15 DIAGNOSIS — E782 Mixed hyperlipidemia: Secondary | ICD-10-CM

## 2020-03-15 DIAGNOSIS — E119 Type 2 diabetes mellitus without complications: Secondary | ICD-10-CM

## 2020-03-15 DIAGNOSIS — F3341 Major depressive disorder, recurrent, in partial remission: Secondary | ICD-10-CM

## 2020-03-15 NOTE — Telephone Encounter (Signed)
Patient called no answer. Left message for patient to return call to schedule office visit. 30 day courtesy refill provided.

## 2020-03-20 ENCOUNTER — Other Ambulatory Visit
Admission: RE | Admit: 2020-03-20 | Discharge: 2020-03-20 | Disposition: A | Discharge status: Home / Self Care | Payer: BC Managed Care – PPO | Source: Ambulatory Visit | Attending: Otolaryngology | Admitting: Otolaryngology

## 2020-03-20 ENCOUNTER — Other Ambulatory Visit: Payer: Self-pay

## 2020-03-20 ENCOUNTER — Encounter: Payer: Self-pay | Admitting: Otolaryngology

## 2020-03-20 DIAGNOSIS — Z7984 Long term (current) use of oral hypoglycemic drugs: Secondary | ICD-10-CM | POA: Diagnosis not present

## 2020-03-20 DIAGNOSIS — Z20822 Contact with and (suspected) exposure to covid-19: Secondary | ICD-10-CM | POA: Insufficient documentation

## 2020-03-20 DIAGNOSIS — H6991 Unspecified Eustachian tube disorder, right ear: Secondary | ICD-10-CM | POA: Diagnosis not present

## 2020-03-20 DIAGNOSIS — Z79899 Other long term (current) drug therapy: Secondary | ICD-10-CM | POA: Diagnosis not present

## 2020-03-20 DIAGNOSIS — Z01812 Encounter for preprocedural laboratory examination: Secondary | ICD-10-CM | POA: Insufficient documentation

## 2020-03-20 DIAGNOSIS — H9071 Mixed conductive and sensorineural hearing loss, unilateral, right ear, with unrestricted hearing on the contralateral side: Secondary | ICD-10-CM | POA: Diagnosis not present

## 2020-03-20 LAB — SARS CORONAVIRUS 2 (TAT 6-24 HRS): SARS Coronavirus 2: NEGATIVE

## 2020-03-21 NOTE — Discharge Instructions (Signed)
[facility] DISCHARGE INSTRUCTIONS FOR MYRINGOTOMY AND TUBE INSERTION  [facility] Carloyn Manner, M.D.   Diet:   After surgery, the patient should take only liquids and foods as tolerated.  The patient may then have a regular diet after the effects of anesthesia have worn off, usually about four to six hours after surgery.  Activities:   The patient should rest until the effects of anesthesia have worn off.  After this, there are no restrictions on the normal daily activities.  Medications:   You will be given antibiotic drops to be used in the ears postoperatively.  It is recommended to use 4 drops 2 times a day for 4 days, then the drops should be saved for possible future use.  The tubes should not cause any discomfort to the patient, but if there is any question, Tylenol should be given according to the instructions for the age of the patient.  Other medications should be continued normally.  Precautions:   Should there be recurrent drainage after the tubes are placed, the drops should be used for approximately 3-4 days.  If it does not clear, you should call the ENT office.  Earplugs:   Earplugs are only needed for those who are going to be submerged under water.  When taking a bath or shower and using a cup or showerhead to rinse hair, it is not necessary to wear earplugs.  These come in a variety of fashions, all of which can be obtained at our office.  However, if one is not able to come by the office, then silicone plugs can be found at most pharmacies.  It is not advised to stick anything in the ear that is not approved as an earplug.  Silly putty is not to be used as an earplug.  Swimming is allowed in patients after ear tubes are inserted, however, they must wear earplugs if they are going to be submerged under water.  For those children who are going to be swimming a lot, it is recommended to use a fitted ear mold, which can be made by our  audiologist.  If discharge is noticed from the ears, this most likely represents an ear infection.  We would recommend getting your eardrops and using them as indicated above.  If it does not clear, then you should call the ENT office.  For follow up, the patient should return to the ENT office three weeks postoperatively and then every six months as required by the doctor.  General Anesthesia, Adult, Care After This sheet gives you information about how to care for yourself after your procedure. Your health care provider may also give you more specific instructions. If you have problems or questions, contact your health care provider. What can I expect after the procedure? After the procedure, the following side effects are common:  Pain or discomfort at the IV site.  Nausea.  Vomiting.  Sore throat.  Trouble concentrating.  Feeling cold or chills.  Weak or tired.  Sleepiness and fatigue.  Soreness and body aches. These side effects can affect parts of the body that were not involved in surgery. Follow these instructions at home:  For at least 24 hours after the procedure:  Have a responsible adult stay with you. It is important to have someone help care for you until you are awake and alert.  Rest as needed.  Do not: ? Participate in activities in which you could fall or become injured. ?  Drive. ? Use heavy machinery. ? Drink alcohol. ? Take sleeping pills or medicines that cause drowsiness. ? Make important decisions or sign legal documents. ? Take care of children on your own. Eating and drinking  Follow any instructions from your health care provider about eating or drinking restrictions.  When you feel hungry, start by eating small amounts of foods that are soft and easy to digest (bland), such as toast. Gradually return to your regular diet.  Drink enough fluid to keep your urine pale yellow.  If you vomit, rehydrate by drinking water, juice, or clear  broth. General instructions  If you have sleep apnea, surgery and certain medicines can increase your risk for breathing problems. Follow instructions from your health care provider about wearing your sleep device: ? Anytime you are sleeping, including during daytime naps. ? While taking prescription pain medicines, sleeping medicines, or medicines that make you drowsy.  Return to your normal activities as told by your health care provider. Ask your health care provider what activities are safe for you.  Take over-the-counter and prescription medicines only as told by your health care provider.  If you smoke, do not smoke without supervision.  Keep all follow-up visits as told by your health care provider. This is important. Contact a health care provider if:  You have nausea or vomiting that does not get better with medicine.  You cannot eat or drink without vomiting.  You have pain that does not get better with medicine.  You are unable to pass urine.  You develop a skin rash.  You have a fever.  You have redness around your IV site that gets worse. Get help right away if:  You have difficulty breathing.  You have chest pain.  You have blood in your urine or stool, or you vomit blood. Summary  After the procedure, it is common to have a sore throat or nausea. It is also common to feel tired.  Have a responsible adult stay with you for the first 24 hours after general anesthesia. It is important to have someone help care for you until you are awake and alert.  When you feel hungry, start by eating small amounts of foods that are soft and easy to digest (bland), such as toast. Gradually return to your regular diet.  Drink enough fluid to keep your urine pale yellow.  Return to your normal activities as told by your health care provider. Ask your health care provider what activities are safe for you. This information is not intended to replace advice given to you by your  health care provider. Make sure you discuss any questions you have with your health care provider. Document Revised: 03/21/2017 Document Reviewed: 11/01/2016 Elsevier Patient Education  [place].

## 2020-03-22 ENCOUNTER — Ambulatory Visit
Admission: RE | Admit: 2020-03-22 | Discharge: 2020-03-22 | Disposition: A | Discharge status: Home / Self Care | Payer: BC Managed Care – PPO | Attending: Otolaryngology | Admitting: Otolaryngology

## 2020-03-22 ENCOUNTER — Ambulatory Visit: Payer: BC Managed Care – PPO | Admitting: Anesthesiology

## 2020-03-22 ENCOUNTER — Other Ambulatory Visit: Payer: Self-pay

## 2020-03-22 ENCOUNTER — Encounter: Payer: Self-pay | Admitting: Otolaryngology

## 2020-03-22 ENCOUNTER — Encounter
Admission: RE | Disposition: A | Discharge status: Home / Self Care | Payer: Self-pay | Source: Home / Self Care | Attending: Otolaryngology

## 2020-03-22 DIAGNOSIS — Z79899 Other long term (current) drug therapy: Secondary | ICD-10-CM | POA: Insufficient documentation

## 2020-03-22 DIAGNOSIS — H6991 Unspecified Eustachian tube disorder, right ear: Secondary | ICD-10-CM | POA: Insufficient documentation

## 2020-03-22 DIAGNOSIS — H9071 Mixed conductive and sensorineural hearing loss, unilateral, right ear, with unrestricted hearing on the contralateral side: Secondary | ICD-10-CM | POA: Insufficient documentation

## 2020-03-22 DIAGNOSIS — Z7984 Long term (current) use of oral hypoglycemic drugs: Secondary | ICD-10-CM | POA: Insufficient documentation

## 2020-03-22 DIAGNOSIS — Z20822 Contact with and (suspected) exposure to covid-19: Secondary | ICD-10-CM | POA: Insufficient documentation

## 2020-03-22 HISTORY — PX: MYRINGOTOMY WITH TUBE PLACEMENT: SHX5663

## 2020-03-22 LAB — GLUCOSE, CAPILLARY
Glucose-Capillary: 162 mg/dL — ABNORMAL HIGH (ref 70–99)
Glucose-Capillary: 130 mg/dL — ABNORMAL HIGH (ref 70–99)

## 2020-03-22 SURGERY — MYRINGOTOMY WITH TUBE PLACEMENT
Anesthesia: General | Site: Ear | Laterality: Right

## 2020-03-22 MED ORDER — LIDOCAINE HCL (CARDIAC) PF 100 MG/5ML IV SOSY
PREFILLED_SYRINGE | INTRAVENOUS | Status: DC | PRN
  Administered 2020-03-22: 50 mg via INTRAVENOUS

## 2020-03-22 MED ORDER — CIPROFLOXACIN-DEXAMETHASONE 0.3-0.1 % OT SUSP
OTIC | Status: DC | PRN
  Administered 2020-03-22: 4 [drp] via OTIC

## 2020-03-22 MED ORDER — LACTATED RINGERS IV SOLN: INTRAVENOUS | Status: DC

## 2020-03-22 MED ORDER — PROPOFOL 10 MG/ML IV BOLUS
INTRAVENOUS | Status: DC | PRN
  Administered 2020-03-22: 80 mg via INTRAVENOUS

## 2020-03-22 MED ORDER — MIDAZOLAM HCL 5 MG/5ML IJ SOLN
INTRAMUSCULAR | Status: DC | PRN
  Administered 2020-03-22: 2 mg via INTRAVENOUS

## 2020-03-22 MED ORDER — GLYCOPYRROLATE 0.2 MG/ML IJ SOLN
INTRAMUSCULAR | Status: DC | PRN
  Administered 2020-03-22: .1 mg via INTRAVENOUS

## 2020-03-22 MED ORDER — CEFDINIR 300 MG PO CAPS: 300.0000 mg | ORAL_CAPSULE | Freq: Two times a day (BID) | ORAL | 0 refills | Status: DC

## 2020-03-22 MED ORDER — ONDANSETRON HCL 4 MG/2ML IJ SOLN
INTRAMUSCULAR | Status: DC | PRN
  Administered 2020-03-22: 4 mg via INTRAVENOUS

## 2020-03-22 SURGICAL SUPPLY — 12 items
BALL CTTN LRG ABS STRL LF (GAUZE/BANDAGES/DRESSINGS) ×1
BLADE MYR LANCE NRW W/HDL (BLADE) ×3 IMPLANT
CANISTER SUCT 1200ML W/VALVE (MISCELLANEOUS) ×3 IMPLANT
COTTONBALL LRG STERILE PKG (GAUZE/BANDAGES/DRESSINGS) ×3 IMPLANT
GLOVE BIO SURGEON STRL SZ7.5 (GLOVE) ×3 IMPLANT
STRAP BODY AND KNEE 60X3 (MISCELLANEOUS) ×3 IMPLANT
TOWEL OR 17X26 4PK STRL BLUE (TOWEL DISPOSABLE) ×3 IMPLANT
TUBE EAR ARMSTRONG HC 1.14X3.5 (OTOLOGIC RELATED) ×4 IMPLANT
TUBE EAR T 1.27X4.5 GO LF (OTOLOGIC RELATED) IMPLANT
TUBE EAR T 1.27X5.3 BFLY (OTOLOGIC RELATED) IMPLANT
TUBING CONN 6MMX3.1M (TUBING) ×2
TUBING SUCTION CONN 0.25 STRL (TUBING) ×1 IMPLANT

## 2020-03-22 NOTE — Op Note (Signed)
..  03/22/2020  8:26 AM    Jenita Seashore  [id number]   Pre-Op Dx:  eustachian tube dysfunction, right mixed hearing loss  Post-op Dx:   Proc:Bilateral myringotomy with tubes  Surg: Evren Shankland  Anes:  General by mask  EBL:  None  Comp:  None  Findings:  Right serous effusion with posterior inferior retraction and anterior superior retraction pocket.  No squamous debris.  Tube placed anterior inferiorly.  Procedure: With the patient in a comfortable supine position, general mask anesthesia was administered.  At an appropriate level, microscope and speculum were used to examine and clean the RIGHT ear canal.  The findings were as described above.  An anterior inferior radial myringotomy incision was sharply executed.  Middle ear contents were suctioned clear with a size 5 otologic suction.  A PE tube was placed without difficulty using a Rosen pick and Animal nutritionist.  Ciprodex otic solution was instilled into the external canal, and insufflated into the middle ear.  A cotton ball was placed at the external meatus. Hemostasis was observed.  This side was completed.  Following this  The patient was returned to anesthesia, awakened, and transferred to recovery in stable condition.  Dispo:  PACU to home  Plan: Routine drop use and water precautions.  Recheck my office three weeks.   Jeannie Fend Taiz Bickle 8:26 AM 03/22/2020

## 2020-03-22 NOTE — Anesthesia Postprocedure Evaluation (Signed)
Anesthesia Post Note  Patient: Betty Walters  Procedure(s) Performed: MYRINGOTOMY WITH TUBE PLACEMENT (Right Ear)     Patient location during evaluation: PACU Anesthesia Type: General Level of consciousness: awake and alert and oriented Pain management: satisfactory to patient Vital Signs Assessment: post-procedure vital signs reviewed and stable Respiratory status: spontaneous breathing, nonlabored ventilation and respiratory function stable Cardiovascular status: blood pressure returned to baseline and stable Postop Assessment: Adequate PO intake and No signs of nausea or vomiting Anesthetic complications: no   No complications documented.  Raliegh Ip

## 2020-03-22 NOTE — Transfer of Care (Signed)
Immediate Anesthesia Transfer of Care Note  Patient: Betty Walters  Procedure(s) Performed: MYRINGOTOMY WITH TUBE PLACEMENT (Right Ear)  Patient Location: PACU  Anesthesia Type: General  Level of Consciousness: awake, alert  and patient cooperative  Airway and Oxygen Therapy: Patient Spontanous Breathing and Patient connected to supplemental oxygen  Post-op Assessment: Post-op Vital signs reviewed, Patient's Cardiovascular Status Stable, Respiratory Function Stable, Patent Airway and No signs of Nausea or vomiting  Post-op Vital Signs: Reviewed and stable  Complications: No complications documented.

## 2020-03-22 NOTE — H&P (Signed)
..  History and Physical paper copy reviewed and updated date of procedure and will be scanned into system.  Patient seen and examined.  

## 2020-03-22 NOTE — Anesthesia Procedure Notes (Signed)
Procedure Name: General with mask airway Performed by: Yeni Jiggetts, CRNA Pre-anesthesia Checklist: Patient identified, Patient being monitored, Emergency Drugs available, Timeout performed and Suction available Patient Re-evaluated:Patient Re-evaluated prior to induction Oxygen Delivery Method: Circle system utilized Preoxygenation: Pre-oxygenation with 100% oxygen Induction Type: Combination inhalational/ intravenous induction Ventilation: Mask ventilation without difficulty Dental Injury: Teeth and Oropharynx as per pre-operative assessment        

## 2020-03-22 NOTE — Anesthesia Preprocedure Evaluation (Signed)
Anesthesia Evaluation  Patient identified by MRN, date of birth, ID band Patient awake    Reviewed: Allergy & Precautions, H&P , NPO status , Patient's Chart, lab work & pertinent test results  Airway Mallampati: II  TM Distance: >3 FB Neck ROM: full    Dental no notable dental hx.    Pulmonary    Pulmonary exam normal breath sounds clear to auscultation       Cardiovascular hypertension, Normal cardiovascular exam Rhythm:regular Rate:Normal     Neuro/Psych    GI/Hepatic GERD  ,  Endo/Other  diabetes, Type 2Morbid obesity  Renal/GU      Musculoskeletal   Abdominal   Peds  Hematology   Anesthesia Other Findings   Reproductive/Obstetrics                             Anesthesia Physical Anesthesia Plan  ASA: II  Anesthesia Plan: General   Post-op Pain Management:    Induction:   PONV Risk Score and Plan: 3 and Treatment may vary due to age or medical condition, TIVA, Propofol infusion and Midazolam  Airway Management Planned: Mask  Additional Equipment:   Intra-op Plan:   Post-operative Plan:   Informed Consent: I have reviewed the patients History and Physical, chart, labs and discussed the procedure including the risks, benefits and alternatives for the proposed anesthesia with the patient or authorized representative who has indicated his/her understanding and acceptance.     Dental Advisory Given  Plan Discussed with: CRNA  Anesthesia Plan Comments:         Anesthesia Quick Evaluation

## 2020-03-22 NOTE — H&P (Signed)
..  History and Physical paper copy reviewed and updated date of procedure and will be scanned into system.  Patient marked on right.

## 2020-04-18 ENCOUNTER — Other Ambulatory Visit: Payer: Self-pay | Admitting: Family Medicine

## 2020-04-18 DIAGNOSIS — E119 Type 2 diabetes mellitus without complications: Secondary | ICD-10-CM

## 2020-04-18 DIAGNOSIS — E782 Mixed hyperlipidemia: Secondary | ICD-10-CM

## 2020-04-18 DIAGNOSIS — F3341 Major depressive disorder, recurrent, in partial remission: Secondary | ICD-10-CM

## 2020-04-18 NOTE — Telephone Encounter (Addendum)
Patient called, left VM to return the call to the office to schedule an OV for follow up. Courtesy refill already given.

## 2020-04-18 NOTE — Telephone Encounter (Signed)
Requested medication (s) are due for refill today: Yes  Requested medication (s) are on the active medication list: Yes  Last refill:  03/15/20  Future visit scheduled: No  Notes to clinic:  Unable to refill per protocol, appointment needed, courtesy refill already given.     Requested Prescriptions  Pending Prescriptions Disp Refills   pioglitazone (ACTOS) 15 MG tablet [Pharmacy Med Name: Pioglitazone HCl 15 MG Oral Tablet] 30 tablet 0    Sig: TAKE 1 TABLET BY MOUTH ONCE DAILY. OFFICE VISIT NEEDED FOR FURTHER REFILLS.      Endocrinology:  Diabetes - Glitazones - pioglitazone Failed - 04/18/2020  1:54 PM      Failed - HBA1C is between 0 and 7.9 and within 180 days    Hgb A1c MFr Bld  Date Value Ref Range Status  08/23/2019 6.8 (H) 4.8 - 5.6 % Final    Comment:             Prediabetes: 5.7 - 6.4          Diabetes: >6.4          Glycemic control for adults with diabetes: <7.0           Failed - Valid encounter within last 6 months    Recent Outpatient Visits           7 months ago Type 2 diabetes mellitus without complication, without long-term current use of insulin ([place])   [facility] Juline Patch, MD   10 months ago Acute maxillary sinusitis, recurrence not specified   [facility] Juline Patch, MD   1 year ago Acute maxillary sinusitis, recurrence not specified   [facility] Juline Patch, MD   1 year ago Type 2 diabetes mellitus without complication, without long-term current use of insulin ([place])   [facility] Juline Patch, MD   1 year ago Type 2 diabetes mellitus without complication, without long-term current use of insulin ([place])   [facility] Juline Patch, MD                  lovastatin (MEVACOR) 40 MG tablet [Pharmacy Med Name: Lovastatin 40 MG Oral Tablet] 30 tablet 0    Sig: TAKE 1 TABLET BY MOUTH ONCE DAILY. OFFICE VISIT NEEDED FOR FURTHER REFILLS.      Cardiovascular:  Antilipid -  Statins Failed - 04/18/2020  1:54 PM      Failed - LDL in normal range and within 360 days    LDL Chol Calc (NIH)  Date Value Ref Range Status  08/23/2019 111 (H) 0 - 99 mg/dL Final          Passed - Total Cholesterol in normal range and within 360 days    Cholesterol, Total  Date Value Ref Range Status  08/23/2019 189 100 - 199 mg/dL Final          Passed - HDL in normal range and within 360 days    HDL  Date Value Ref Range Status  08/23/2019 56 >39 mg/dL Final          Passed - Triglycerides in normal range and within 360 days    Triglycerides  Date Value Ref Range Status  08/23/2019 124 0 - 149 mg/dL Final          Passed - Patient is not pregnant      Passed - Valid encounter within last 12 months    Recent Outpatient Visits  7 months ago Type 2 diabetes mellitus without complication, without long-term current use of insulin ([place])   [facility] Juline Patch, MD   10 months ago Acute maxillary sinusitis, recurrence not specified   [facility] Juline Patch, MD   1 year ago Acute maxillary sinusitis, recurrence not specified   [facility] Juline Patch, MD   1 year ago Type 2 diabetes mellitus without complication, without long-term current use of insulin ([place])   [facility] Juline Patch, MD   1 year ago Type 2 diabetes mellitus without complication, without long-term current use of insulin ([place])   [facility] Juline Patch, MD                  sertraline (ZOLOFT) 100 MG tablet [Pharmacy Med Name: Sertraline HCl 100 MG Oral Tablet] 30 tablet 0    Sig: TAKE 1 TABLET BY MOUTH ONCE DAILY ALONG WITH 50MG  TABLET FOR TOTAL DAILY DOSE OF 150MG       Psychiatry:  Antidepressants - SSRI Failed - 04/18/2020  1:54 PM      Failed - Valid encounter within last 6 months    Recent Outpatient Visits           7 months ago Type 2 diabetes mellitus without complication, without long-term current use  of insulin ([place])   [facility] Juline Patch, MD   10 months ago Acute maxillary sinusitis, recurrence not specified   [facility] Juline Patch, MD   1 year ago Acute maxillary sinusitis, recurrence not specified   [facility] Juline Patch, MD   1 year ago Type 2 diabetes mellitus without complication, without long-term current use of insulin ([place])   [facility] Juline Patch, MD   1 year ago Type 2 diabetes mellitus without complication, without long-term current use of insulin ([place])   [facility] Juline Patch, MD                Passed - Completed PHQ-2 or PHQ-9 in the last 360 days        sertraline (ZOLOFT) 50 MG tablet [Pharmacy Med Name: Sertraline HCl 50 MG Oral Tablet] 30 tablet 0    Sig: TAKE 1 TABLET BY MOUTH ONCE DAILY ALONG WITH 100MG  TABLET FOR TOTAL DAILY DOSE OF 150MG . APPOINTMENT NEEDED BEFORE 30DAYS ARE UP      Psychiatry:  Antidepressants - SSRI Failed - 04/18/2020  1:54 PM      Failed - Valid encounter within last 6 months    Recent Outpatient Visits           7 months ago Type 2 diabetes mellitus without complication, without long-term current use of insulin ([place])   [facility] Juline Patch, MD   10 months ago Acute maxillary sinusitis, recurrence not specified   [facility] Juline Patch, MD   1 year ago Acute maxillary sinusitis, recurrence not specified   [facility] Juline Patch, MD   1 year ago Type 2 diabetes mellitus without complication, without long-term current use of insulin ([place])   [facility] Juline Patch, MD   1 year ago Type 2 diabetes mellitus without complication, without long-term current use of insulin (HCC)   [facility] Juline Patch, MD                Passed - Completed PHQ-2 or  PHQ-9 in the last 360 days

## 2020-04-19 ENCOUNTER — Ambulatory Visit: Payer: BC Managed Care – PPO | Admitting: Family Medicine

## 2020-04-25 ENCOUNTER — Other Ambulatory Visit: Payer: Self-pay

## 2020-04-25 ENCOUNTER — Encounter: Payer: Self-pay | Admitting: Family Medicine

## 2020-04-25 ENCOUNTER — Ambulatory Visit: Payer: BC Managed Care – PPO | Admitting: Family Medicine

## 2020-04-25 VITALS — BP 138/62 | HR 80 | Ht 68.0 in | Wt 229.0 lb

## 2020-04-25 DIAGNOSIS — F3341 Major depressive disorder, recurrent, in partial remission: Secondary | ICD-10-CM | POA: Diagnosis not present

## 2020-04-25 DIAGNOSIS — I10 Essential (primary) hypertension: Secondary | ICD-10-CM

## 2020-04-25 DIAGNOSIS — E782 Mixed hyperlipidemia: Secondary | ICD-10-CM | POA: Diagnosis not present

## 2020-04-25 DIAGNOSIS — E119 Type 2 diabetes mellitus without complications: Secondary | ICD-10-CM | POA: Diagnosis not present

## 2020-04-25 DIAGNOSIS — Z23 Encounter for immunization: Secondary | ICD-10-CM | POA: Diagnosis not present

## 2020-04-25 DIAGNOSIS — Z1231 Encounter for screening mammogram for malignant neoplasm of breast: Secondary | ICD-10-CM

## 2020-04-25 MED ORDER — PIOGLITAZONE HCL 15 MG PO TABS: ORAL_TABLET | ORAL | 0 refills | Status: DC

## 2020-04-25 MED ORDER — LISINOPRIL 20 MG PO TABS: 20.0000 mg | ORAL_TABLET | Freq: Every day | ORAL | 1 refills | Status: DC

## 2020-04-25 MED ORDER — VICTOZA 18 MG/3ML ~~LOC~~ SOPN: PEN_INJECTOR | SUBCUTANEOUS | 8 refills | Status: DC

## 2020-04-25 MED ORDER — METFORMIN HCL 1000 MG PO TABS: 1000.0000 mg | ORAL_TABLET | Freq: Two times a day (BID) | ORAL | 1 refills | Status: DC

## 2020-04-25 MED ORDER — SERTRALINE HCL 100 MG PO TABS: ORAL_TABLET | ORAL | 1 refills | Status: DC

## 2020-04-25 MED ORDER — SERTRALINE HCL 50 MG PO TABS: ORAL_TABLET | ORAL | 1 refills | Status: DC

## 2020-04-25 MED ORDER — LOVASTATIN 40 MG PO TABS: ORAL_TABLET | ORAL | 1 refills | Status: DC

## 2020-04-25 MED ORDER — PIOGLITAZONE HCL 15 MG PO TABS: ORAL_TABLET | ORAL | 1 refills | Status: DC

## 2020-04-25 NOTE — Progress Notes (Signed)
Date:  04/25/2020   Name:  Betty Walters   DOB:  10-21-1960   MRN:  [id number]   Chief Complaint: Diabetes, Hypertension, Hyperlipidemia, Depression, and Flu Vaccine  Diabetes She presents for her follow-up diabetic visit. She has type 2 diabetes mellitus. Her disease course has been stable. Pertinent negatives for hypoglycemia include no dizziness, headaches, nervousness/anxiousness or sweats. There are no diabetic associated symptoms. Pertinent negatives for diabetes include no blurred vision, no chest pain, no fatigue, no foot paresthesias, no foot ulcerations, no polydipsia, no polyphagia, no polyuria, no visual change, no weakness and no weight loss. There are no hypoglycemic complications. Symptoms are stable. There are no diabetic complications. Pertinent negatives for diabetic complications include no CVA, PVD or retinopathy. Risk factors for coronary artery disease include hypertension and dyslipidemia. Current diabetic treatment includes oral agent (dual therapy) (victoza). She is compliant with treatment all of the time. Her weight is stable. She is following a generally unhealthy diet. She participates in exercise daily. There is no change in her home blood glucose trend. Her breakfast blood glucose is taken between 8-9 am. An ACE inhibitor/angiotensin II receptor blocker is being taken.  Hypertension This is a chronic problem. The current episode started more than 1 year ago. The problem has been gradually improving since onset. The problem is controlled. Pertinent negatives include no anxiety, blurred vision, chest pain, headaches, malaise/fatigue, neck pain, orthopnea, palpitations, peripheral edema, PND, shortness of breath or sweats. Risk factors for coronary artery disease include dyslipidemia. Past treatments include ACE inhibitors. The current treatment provides moderate improvement. There are no compliance problems.  There is no history of angina, kidney disease, CAD/MI, CVA,  heart failure, left ventricular hypertrophy, PVD or retinopathy. There is no history of chronic renal disease, a hypertension causing med or renovascular disease.  Hyperlipidemia This is a new problem. The current episode started more than 1 year ago. The problem is controlled. Recent lipid tests were reviewed and are normal. She has no history of chronic renal disease. Pertinent negatives include no chest pain, focal sensory loss, focal weakness, leg pain, myalgias or shortness of breath. Current antihyperlipidemic treatment includes statins. The current treatment provides moderate improvement of lipids. There are no compliance problems.  Risk factors for coronary artery disease include family history.  Depression        This is a chronic problem.  The current episode started more than 1 year ago.   Associated symptoms include no decreased concentration, no fatigue, no helplessness, no hopelessness, does not have insomnia, not irritable, no restlessness, no decreased interest, no appetite change, no body aches, no myalgias, no headaches, no indigestion, not sad and no suicidal ideas.     The symptoms are aggravated by medication.  Past treatments include SNRIs - Serotonin and norepinephrine reuptake inhibitors.  Previous treatment provided mild relief.   Pertinent negatives include no anxiety.   Lab Results  Component Value Date   CREATININE 0.77 08/23/2019   BUN 29 (H) 08/23/2019   NA 136 08/23/2019   K 4.4 08/23/2019   CL 102 08/23/2019   CO2 20 08/23/2019   Lab Results  Component Value Date   CHOL 189 08/23/2019   HDL 56 08/23/2019   LDLCALC 111 (H) 08/23/2019   TRIG 124 08/23/2019   CHOLHDL 4.6 (H) 08/05/2018   No results found for: TSH Lab Results  Component Value Date   HGBA1C 6.8 (H) 08/23/2019   Lab Results  Component Value Date   WBC 9.7 02/05/2017  HGB 13.1 02/05/2017   HCT 38.9 02/05/2017   MCV 86.1 02/05/2017   PLT 301 02/05/2017   Lab Results  Component Value Date    ALT 18 03/24/2019   AST 12 03/24/2019   ALKPHOS 117 03/24/2019   BILITOT 0.3 03/24/2019     Review of Systems  Constitutional: Negative.  Negative for appetite change, chills, fatigue, fever, malaise/fatigue, unexpected weight change and weight loss.  HENT: Negative for congestion, ear discharge, ear pain, rhinorrhea, sinus pressure, sneezing and sore throat.   Eyes: Negative for blurred vision, double vision, photophobia, pain, discharge, redness and itching.  Respiratory: Negative for cough, shortness of breath, wheezing and stridor.   Cardiovascular: Negative for chest pain, palpitations, orthopnea and PND.  Gastrointestinal: Negative for abdominal pain, blood in stool, constipation, diarrhea, nausea and vomiting.  Endocrine: Negative for cold intolerance, heat intolerance, polydipsia, polyphagia and polyuria.  Genitourinary: Negative for dysuria, flank pain, frequency, hematuria, menstrual problem, pelvic pain, urgency, vaginal bleeding and vaginal discharge.  Musculoskeletal: Negative for arthralgias, back pain, myalgias and neck pain.  Skin: Negative for rash.  Allergic/Immunologic: Negative for environmental allergies and food allergies.  Neurological: Negative for dizziness, focal weakness, weakness, light-headedness, numbness and headaches.  Hematological: Negative for adenopathy. Does not bruise/bleed easily.  Psychiatric/Behavioral: Positive for depression. Negative for decreased concentration, dysphoric mood and suicidal ideas. The patient is not nervous/anxious and does not have insomnia.     Patient Active Problem List   Diagnosis Date Noted  . Heartburn   . Dyspepsia   . Change in bowel habits   . Benign neoplasm of descending colon   . Chronic pain of toes of both feet 09/20/2016  . Type 2 diabetes mellitus with complication (HCC) 09/06/2015  . Allergic rhinitis due to pollen 06/21/2015  . Concussion with coma 10/21/2014  . Clinical depression 09/28/2014  .  Diabetes mellitus, type 2 (HCC) 09/28/2014  . HLD (hyperlipidemia) 09/28/2014  . Essential hypertension 09/28/2014  . Adiposity 09/28/2014    Allergies  Allergen Reactions  . Amoxicillin-Pot Clavulanate Nausea And Vomiting    (OK with Amoxicillin alone)    Past Surgical History:  Procedure Laterality Date  . CESAREAN SECTION    . COLONOSCOPY WITH PROPOFOL N/A 04/11/2017   Procedure: COLONOSCOPY WITH colon;  Surgeon: Midge MiniumWohl, Darren, MD;  [facility];  Service: Endoscopy;  Laterality: N/A;  . ESOPHAGOGASTRODUODENOSCOPY (EGD) WITH PROPOFOL N/A 04/11/2017   Procedure: ESOPHAGOGASTRODUODENOSCOPY (EGD) WITH PROPOFOL;  Surgeon: Midge MiniumWohl, Darren, MD;  [facility];  Service: Endoscopy;  Laterality: N/A;  Diabetic - oral meds  . GALLBLADDER SURGERY    . KNEE SURGERY    . MYRINGOTOMY WITH TUBE PLACEMENT Right 03/22/2020   Procedure: MYRINGOTOMY WITH TUBE PLACEMENT;  Surgeon: Bud FaceVaught, Creighton, MD;  [facility];  Service: ENT;  Laterality: Right;  Diabetic  . POLYPECTOMY  04/11/2017   Procedure: POLYPECTOMY INTESTINAL;  Surgeon: Midge MiniumWohl, Darren, MD;  [facility];  Service: Endoscopy;;  . WRIST SURGERY      Social History   Tobacco Use  . Smoking status: Never Smoker  . Smokeless tobacco: Never Used  Vaping Use  . Vaping Use: Never used  Substance Use Topics  . Alcohol use: Yes    Alcohol/week: 0.0 standard drinks    Comment: rare - Holidays  . Drug use: No     Medication list has been reviewed and updated.  Current Meds  Medication Sig  . celecoxib (CELEBREX) 200 MG capsule Take 200 mg by  mouth daily. ortho  . gabapentin (NEURONTIN) 300 MG capsule Take 300 mg by mouth at bedtime. Para March  . Insulin Pen Needle (PEN NEEDLES 3/16") 31G X 5 MM MISC 1 each by Does not apply route daily.  . Lansoprazole (PREVACID 24HR PO) Take 2 capsules by mouth daily. otc  . lisinopril (ZESTRIL) 20 MG tablet Take 1 tablet (20 mg total) by  mouth daily.  Marland Kitchen lovastatin (MEVACOR) 40 MG tablet TAKE 1 TABLET BY MOUTH ONCE DAILY. OFFICE VISIT NEEDED FOR FURTHER REFILLS.  . metFORMIN (GLUCOPHAGE) 1000 MG tablet Take 1 tablet (1,000 mg total) by mouth 2 (two) times daily.  . pioglitazone (ACTOS) 15 MG tablet TAKE 1 TABLET BY MOUTH ONCE DAILY. OFFICE VISIT NEEDED FOR FURTHER REFILLS.  Marland Kitchen sertraline (ZOLOFT) 100 MG tablet TAKE 1 TABLET BY MOUTH ONCE DAILY ALONG WITH 50MG  TABLET FOR TOTAL DAILY DOSE OF 150MG   . sertraline (ZOLOFT) 50 MG tablet TAKE 1 TABLET BY MOUTH ONCE DAILY ALONG WITH 100MG  TABLET FOR TOTAL DAILY DOSE OF 150MG . APPOINTMENT NEEDED BEFORE 30DAYS ARE UP  . VICTOZA 18 MG/3ML SOPN INJECT 1.8MG  INTO THE SKIN ONCE DAILY    PHQ 2/9 Scores 04/25/2020 08/23/2019 06/01/2019 03/24/2019  PHQ - 2 Score 0 0 0 1  PHQ- 9 Score 0 3 4 4     GAD 7 : Generalized Anxiety Score 04/25/2020 08/23/2019 06/01/2019 03/24/2019  Nervous, Anxious, on Edge 1 1 0 1  Control/stop worrying 1 1 0 2  Worry too much - different things 0 1 0 1  Trouble relaxing 0 0 0 0  Restless 0 0 1 0  Easily annoyed or irritable 0 1 0 2  Afraid - awful might happen 0 2 0 1  Total GAD 7 Score 2 6 1 7   Anxiety Difficulty - Not difficult at all Not difficult at all Not difficult at all    BP Readings from Last 3 Encounters:  04/25/20 138/62  03/22/20 125/61  03/01/20 129/68    Physical Exam Vitals and nursing note reviewed.  Constitutional:      General: She is not irritable.    Appearance: She is well-developed and well-nourished.  HENT:     Head: Normocephalic.     Right Ear: Tympanic membrane, ear canal and external ear normal.     Left Ear: Tympanic membrane, ear canal and external ear normal.     Nose: Nose normal.     Mouth/Throat:     Mouth: Oropharynx is clear and moist.  Eyes:     General: Lids are everted, no foreign bodies appreciated. No scleral icterus.       Left eye: No foreign body or hordeolum.     Extraocular Movements: EOM normal.      Conjunctiva/sclera: Conjunctivae normal.     Right eye: Right conjunctiva is not injected.     Left eye: Left conjunctiva is not injected.     Pupils: Pupils are equal, round, and reactive to light.  Neck:     Thyroid: No thyromegaly.     Vascular: No JVD.     Trachea: No tracheal deviation.  Cardiovascular:     Rate and Rhythm: Normal rate and regular rhythm.     Pulses: Intact distal pulses.     Heart sounds: Normal heart sounds. No murmur heard. No friction rub. No gallop.   Pulmonary:     Effort: Pulmonary effort is normal. No respiratory distress.     Breath sounds: Normal breath sounds. No wheezing or rales.  Chest:  Breasts:     Right: Normal. No swelling, bleeding, inverted nipple, mass, nipple discharge, skin change, tenderness, axillary adenopathy or supraclavicular adenopathy.     Left: Normal. No swelling, bleeding, inverted nipple, mass, nipple discharge, skin change, tenderness, axillary adenopathy or supraclavicular adenopathy.    Abdominal:     General: Bowel sounds are normal.     Palpations: Abdomen is soft. There is no hepatosplenomegaly or mass.     Tenderness: There is no abdominal tenderness. There is no guarding or rebound.  Musculoskeletal:        General: No tenderness or edema. Normal range of motion.     Cervical back: Normal range of motion and neck supple.  Lymphadenopathy:     Cervical: No cervical adenopathy.     Upper Body:     Right upper body: No supraclavicular, axillary or pectoral adenopathy.     Left upper body: No supraclavicular, axillary or pectoral adenopathy.  Skin:    General: Skin is warm.     Findings: No rash.  Neurological:     Mental Status: She is alert and oriented to person, place, and time.     Cranial Nerves: No cranial nerve deficit.     Deep Tendon Reflexes: Strength normal. Reflexes normal.  Psychiatric:        Mood and Affect: Mood and affect normal. Mood is not anxious or depressed.     Wt Readings from Last 3  Encounters:  04/25/20 229 lb (103.9 kg)  03/22/20 230 lb (104.3 kg)  03/01/20 220 lb (99.8 kg)    BP 138/62   Pulse 80   Ht 5\' 8"  (1.727 m)   Wt 229 lb (103.9 kg)   BMI 34.82 kg/m   Assessment and Plan: 1. Type 2 diabetes mellitus without complication, without long-term current use of insulin (HCC) Chronic.  Likely uncontrolled.  Stable.  Patient has not been doing well on her diet because of lots of stresses in her circumstances at this time we will refill her pioglitazone 15 mg once a day Metformin 1 g twice a day and Victoza 1.8 mg daily.  We will recheck patient with A1c and microalbuminuria as well as renal function panel. - Renal Function Panel - Hemoglobin A1c - Microalbumin, urine - pioglitazone (ACTOS) 15 MG tablet; TAKE 1 TABLET BY MOUTH ONCE DAILY. OFFICE VISIT NEEDED FOR FURTHER REFILLS.  Dispense: 90 tablet; Refill: 1 - liraglutide (VICTOZA) 18 MG/3ML SOPN; INJECT 1.8MG  INTO THE SKIN ONCE DAILY  Dispense: 9 mL; Refill: 8 - metFORMIN (GLUCOPHAGE) 1000 MG tablet; Take 1 tablet (1,000 mg total) by mouth 2 (two) times daily.  Dispense: 180 tablet; Refill: 1  2. Essential hypertension Chronic.  Controlled.  Stable.  Blood pressure today is 138/62.  Continue lisinopril 20 mg once a day.  Will check renal function panel. - lisinopril (ZESTRIL) 20 MG tablet; Take 1 tablet (20 mg total) by mouth daily.  Dispense: 90 tablet; Refill: 1 - Renal Function Panel  3. Mixed hyperlipidemia Chronic.  Controlled.  Stable.  Continue lovastatin 40 mg once a day.  Will check lipid panel. - Lipid Panel With LDL/HDL Ratio - lovastatin (MEVACOR) 40 MG tablet; TAKE 1 TABLET BY MOUTH ONCE DAILY. OFFICE VISIT NEEDED FOR FURTHER REFILLS.  Dispense: 90 tablet; Refill: 1  4. Recurrent major depressive disorder, in partial remission ([place]) .  Controlled.  Stable.  Continue sertraline 100 mg and sertraline 50 mg 1 of each tablet once a day. - sertraline (ZOLOFT) 100 MG tablet; TAKE  1 TABLET BY MOUTH  ONCE DAILY ALONG WITH 50MG  TABLET FOR TOTAL DAILY DOSE OF 150MG   Dispense: 90 tablet; Refill: 1 - sertraline (ZOLOFT) 50 MG tablet; TAKE 1 TABLET BY MOUTH ONCE DAILY ALONG WITH 100MG  TABLET FOR TOTAL DAILY DOSE OF 150MG . APPOINTMENT NEEDED BEFORE 30DAYS ARE UP  Dispense: 90 tablet; Refill: 1  5. Need for immunization against influenza Discussion and administration. - Flu Vaccine QUAD 36+ mos IM  6. Breast cancer screening by mammogram Breast exam is normal with no palpable mass we will order screening mammogram. - MM 3D SCREEN BREAST BILATERAL; Future

## 2020-04-26 ENCOUNTER — Other Ambulatory Visit: Payer: Self-pay

## 2020-04-26 ENCOUNTER — Telehealth: Payer: Self-pay | Admitting: Gastroenterology

## 2020-04-26 LAB — RENAL FUNCTION PANEL
Albumin: 4.7 g/dL (ref 3.8–4.9)
BUN/Creatinine Ratio: 21 (ref 9–23)
BUN: 19 mg/dL (ref 6–24)
CO2: 17 mmol/L — ABNORMAL LOW (ref 20–29)
Calcium: 9.9 mg/dL (ref 8.7–10.2)
Chloride: 100 mmol/L (ref 96–106)
Creatinine, Ser: 0.9 mg/dL (ref 0.57–1.00)
GFR calc Af Amer: 82 mL/min/{1.73_m2} (ref >59)
GFR calc non Af Amer: 71 mL/min/{1.73_m2} (ref >59)
Glucose: 113 mg/dL — ABNORMAL HIGH (ref 65–99)
Phosphorus: 4.3 mg/dL (ref 3.0–4.3)
Potassium: 4.5 mmol/L (ref 3.5–5.2)
Sodium: 136 mmol/L (ref 134–144)

## 2020-04-26 LAB — MICROALBUMIN, URINE: Microalbumin, Urine: 36.4 ug/mL

## 2020-04-26 LAB — LIPID PANEL WITH LDL/HDL RATIO
Cholesterol, Total: 229 mg/dL — ABNORMAL HIGH (ref 100–199)
HDL: 58 mg/dL (ref >39)
LDL Chol Calc (NIH): 134 mg/dL — ABNORMAL HIGH (ref 0–99)
LDL/HDL Ratio: 2.3 ratio (ref 0.0–3.2)
Triglycerides: 209 mg/dL — ABNORMAL HIGH (ref 0–149)
VLDL Cholesterol Cal: 37 mg/dL (ref 5–40)

## 2020-04-26 LAB — HEMOGLOBIN A1C
Est. average glucose Bld gHb Est-mCnc: 166 mg/dL
Hgb A1c MFr Bld: 7.4 % — ABNORMAL HIGH (ref 4.8–5.6)

## 2020-04-26 MED ORDER — LANSOPRAZOLE 30 MG PO CPDR: 30.0000 mg | DELAYED_RELEASE_CAPSULE | Freq: Two times a day (BID) | ORAL | 1 refills | Status: DC

## 2020-04-26 NOTE — Telephone Encounter (Signed)
Can you please call pt back and schedule an office appt? Her last appt with Dr. Allen Norris was 05/05/2017 and he saw her for GERD. She will need to be evaluated for these symptoms.

## 2020-04-26 NOTE — Telephone Encounter (Signed)
Patient having problems with her IBS. Having diarrhea and nausea w/ a sulfur taste in her mouth and bloating. Been going on for a couple months. [phone]

## 2020-05-02 ENCOUNTER — Ambulatory Visit (INDEPENDENT_AMBULATORY_CARE_PROVIDER_SITE_OTHER): Payer: BC Managed Care – PPO | Admitting: Family Medicine

## 2020-05-02 ENCOUNTER — Encounter: Payer: Self-pay | Admitting: Family Medicine

## 2020-05-02 ENCOUNTER — Other Ambulatory Visit: Payer: Self-pay

## 2020-05-02 ENCOUNTER — Other Ambulatory Visit: Payer: BC Managed Care – PPO

## 2020-05-02 VITALS — HR 103

## 2020-05-02 DIAGNOSIS — U071 COVID-19: Secondary | ICD-10-CM

## 2020-05-02 DIAGNOSIS — Z20822 Contact with and (suspected) exposure to covid-19: Secondary | ICD-10-CM

## 2020-05-02 NOTE — Patient Instructions (Signed)

## 2020-05-02 NOTE — Progress Notes (Addendum)
Date:  05/02/2020   Name:  Betty Walters   DOB:  24-Mar-1961   MRN:  [id number]   Chief Complaint: Sore Throat (Headache, coughing, fever, sore throat, chills, aches)  I connected withthis patient, Betty Walters, by telephoneat the patient's home.  I verified that I am speaking with the correct person using two identifiers. This visit was conducted via telephone due to the Covid-19 outbreak from my office at [facility] in [place], [place]. I discussed the limitations, risks, security and privacy concerns of performing an evaluation and management service by telephone. I also discussed with the patient that there may be a patient responsible charge related to this service. The patient expressed understanding and agreed to proceed.  Sore Throat  This is a new problem. The current episode started in the past 7 days. The problem has been gradually worsening. Neither side of throat is experiencing more pain than the other. Maximum temperature: uncertain. The pain is moderate. Associated symptoms include trouble swallowing. Pertinent negatives include no shortness of breath. She has had no exposure to strep or mono. She has tried NSAIDs for the symptoms.    Lab Results  Component Value Date   CREATININE 0.90 04/25/2020   BUN 19 04/25/2020   NA 136 04/25/2020   K 4.5 04/25/2020   CL 100 04/25/2020   CO2 17 (L) 04/25/2020   Lab Results  Component Value Date   CHOL 229 (H) 04/25/2020   HDL 58 04/25/2020   LDLCALC 134 (H) 04/25/2020   TRIG 209 (H) 04/25/2020   CHOLHDL 4.6 (H) 08/05/2018   No results found for: TSH Lab Results  Component Value Date   HGBA1C 7.4 (H) 04/25/2020   Lab Results  Component Value Date   WBC 9.7 02/05/2017   HGB 13.1 02/05/2017   HCT 38.9 02/05/2017   MCV 86.1 02/05/2017   PLT 301 02/05/2017   Lab Results  Component Value Date   ALT 18 03/24/2019   AST 12 03/24/2019   ALKPHOS 117 03/24/2019   BILITOT 0.3 03/24/2019     Review of Systems   HENT: Positive for trouble swallowing.   Respiratory: Negative for shortness of breath.     Patient Active Problem List   Diagnosis Date Noted  . Heartburn   . Dyspepsia   . Change in bowel habits   . Benign neoplasm of descending colon   . Chronic pain of toes of both feet 09/20/2016  . Type 2 diabetes mellitus with complication ([place]) 93/81/0175  . Allergic rhinitis due to pollen 06/21/2015  . Concussion with coma 10/21/2014  . Clinical depression 09/28/2014  . Diabetes mellitus, type 2 ([place]) 09/28/2014  . HLD (hyperlipidemia) 09/28/2014  . Essential hypertension 09/28/2014  . Adiposity 09/28/2014    Allergies  Allergen Reactions  . Amoxicillin-Pot Clavulanate Nausea And Vomiting    (OK with Amoxicillin alone)    Past Surgical History:  Procedure Laterality Date  . CESAREAN SECTION    . COLONOSCOPY WITH PROPOFOL N/A 04/11/2017   Procedure: COLONOSCOPY WITH colon;  Surgeon: Lucilla Lame, MD;  Location: [place];  Service: Endoscopy;  Laterality: N/A;  . ESOPHAGOGASTRODUODENOSCOPY (EGD) WITH PROPOFOL N/A 04/11/2017   Procedure: ESOPHAGOGASTRODUODENOSCOPY (EGD) WITH PROPOFOL;  Surgeon: Lucilla Lame, MD;  Location: [place];  Service: Endoscopy;  Laterality: N/A;  Diabetic - oral meds  . GALLBLADDER SURGERY    . KNEE SURGERY    . MYRINGOTOMY WITH TUBE PLACEMENT Right 03/22/2020   Procedure: MYRINGOTOMY WITH TUBE  PLACEMENT;  Surgeon: Carloyn Manner, MD;  Location: [place];  Service: ENT;  Laterality: Right;  Diabetic  . POLYPECTOMY  04/11/2017   Procedure: POLYPECTOMY INTESTINAL;  Surgeon: Lucilla Lame, MD;  Location: [place];  Service: Endoscopy;;  . WRIST SURGERY      Social History   Tobacco Use  . Smoking status: Never Smoker  . Smokeless tobacco: Never Used  Vaping Use  . Vaping Use: Never used  Substance Use Topics  . Alcohol use: Yes    Alcohol/week: 0.0 standard drinks    Comment: rare - Holidays  . Drug use: No      Medication list has been reviewed and updated.  Current Meds  Medication Sig  . celecoxib (CELEBREX) 200 MG capsule Take 200 mg by mouth daily. ortho  . gabapentin (NEURONTIN) 300 MG capsule Take 300 mg by mouth at bedtime. Para March  . Insulin Pen Needle (PEN NEEDLES 3/16") 31G X 5 MM MISC 1 each by Does not apply route daily.  . lansoprazole (PREVACID) 30 MG capsule Take 1 capsule (30 mg total) by mouth 2 (two) times daily before a meal.  . liraglutide (VICTOZA) 18 MG/3ML SOPN INJECT 1.8MG  INTO THE SKIN ONCE DAILY  . lisinopril (ZESTRIL) 20 MG tablet Take 1 tablet (20 mg total) by mouth daily.  Marland Kitchen lovastatin (MEVACOR) 40 MG tablet TAKE 1 TABLET BY MOUTH ONCE DAILY. OFFICE VISIT NEEDED FOR FURTHER REFILLS.  . metFORMIN (GLUCOPHAGE) 1000 MG tablet Take 1 tablet (1,000 mg total) by mouth 2 (two) times daily.  . pioglitazone (ACTOS) 15 MG tablet TAKE 1 TABLET BY MOUTH ONCE DAILY. OFFICE VISIT NEEDED FOR FURTHER REFILLS.  . Probiotic Product (PROBIOTIC ADVANCED) CAPS Take 1 capsule by mouth daily.  . sertraline (ZOLOFT) 100 MG tablet TAKE 1 TABLET BY MOUTH ONCE DAILY ALONG WITH 50MG  TABLET FOR TOTAL DAILY DOSE OF 150MG   . sertraline (ZOLOFT) 50 MG tablet TAKE 1 TABLET BY MOUTH ONCE DAILY ALONG WITH 100MG  TABLET FOR TOTAL DAILY DOSE OF 150MG . APPOINTMENT NEEDED BEFORE 30DAYS ARE UP    PHQ 2/9 Scores 04/25/2020 08/23/2019 06/01/2019 03/24/2019  PHQ - 2 Score 0 0 0 1  PHQ- 9 Score 0 3 4 4     GAD 7 : Generalized Anxiety Score 04/25/2020 08/23/2019 06/01/2019 03/24/2019  Nervous, Anxious, on Edge 1 1 0 1  Control/stop worrying 1 1 0 2  Worry too much - different things 0 1 0 1  Trouble relaxing 0 0 0 0  Restless 0 0 1 0  Easily annoyed or irritable 0 1 0 2  Afraid - awful might happen 0 2 0 1  Total GAD 7 Score 2 6 1 7   Anxiety Difficulty - Not difficult at all Not difficult at all Not difficult at all    BP Readings from Last 3 Encounters:  04/25/20 138/62  03/22/20 125/61  03/01/20 129/68     Physical Exam  Wt Readings from Last 3 Encounters:  04/25/20 229 lb (103.9 kg)  03/22/20 230 lb (104.3 kg)  03/01/20 220 lb (99.8 kg)    There were no vitals taken for this visit.  Assessment and Plan:  1. COVID-19 determined by clinical diagnostic criteria Patient had exposure to family member with a symptoms of cough fever sore throat myalgias and extreme fatigue.  This is similar to the symptoms of her husband who also has tested positive for Covid.  Patient will be going to be checked for Covid later this afternoon in the meantime we  have discussed all the symptoms and what she can do at home and then event of an emergency which she is to watch out for.  Patient understands and has been encouraged to call us at any time if things were to worsen.  Patient's been encouraged to use ibuprofen and/or Tylenol for fever control and for myalgias and sore throat.  I spent 12 minutes with this patient, More than 50% of that time was spent in face to face education, counseling and care coordination.

## 2020-05-03 LAB — NOVEL CORONAVIRUS, NAA: SARS-CoV-2, NAA: DETECTED — AB

## 2020-05-03 LAB — SARS-COV-2, NAA 2 DAY TAT

## 2020-05-04 ENCOUNTER — Telehealth: Payer: Self-pay

## 2020-05-04 ENCOUNTER — Inpatient Hospital Stay: Admission: RE | Admit: 2020-05-04 | Payer: BC Managed Care – PPO | Source: Ambulatory Visit

## 2020-05-04 NOTE — Telephone Encounter (Signed)
Called to discuss with patient about COVID-19 symptoms and the use of one of the available treatments for those with mild to moderate Covid symptoms and at a high risk of hospitalization.  Pt appears to qualify for outpatient treatment due to co-morbid conditions and/or a member of an at-risk group in accordance with the FDA Emergency Use Authorization.    Symptom onset: 04/30/20 Vaccinated: No Booster? No Immunocompromised? No Qualifiers: HTN,DM.  Pt. States she would like to think about it.   Marcello Moores

## 2020-05-09 ENCOUNTER — Encounter: Payer: Self-pay | Admitting: Family Medicine

## 2020-05-10 ENCOUNTER — Encounter: Payer: Self-pay | Admitting: Family Medicine

## 2020-05-10 ENCOUNTER — Ambulatory Visit (INDEPENDENT_AMBULATORY_CARE_PROVIDER_SITE_OTHER): Payer: BC Managed Care – PPO | Admitting: Family Medicine

## 2020-05-10 ENCOUNTER — Other Ambulatory Visit: Payer: Self-pay

## 2020-05-10 DIAGNOSIS — R059 Cough, unspecified: Secondary | ICD-10-CM

## 2020-05-10 DIAGNOSIS — J0100 Acute maxillary sinusitis, unspecified: Secondary | ICD-10-CM

## 2020-05-10 MED ORDER — AZITHROMYCIN 250 MG PO TABS: ORAL_TABLET | ORAL | 0 refills | Status: DC

## 2020-05-10 MED ORDER — GUAIFENESIN-CODEINE 100-10 MG/5ML PO SYRP: 5.0000 mL | ORAL_SOLUTION | Freq: Four times a day (QID) | ORAL | 0 refills | Status: DC | PRN

## 2020-05-10 NOTE — Patient Instructions (Signed)

## 2020-05-10 NOTE — Progress Notes (Signed)
Date:  05/10/2020   Name:  Betty Walters   DOB:  07/11/1960   MRN:  [id number]   Chief Complaint: Covid Positive (05/03/20 positive - symptoms started 31st. Congestion, headaches, facial pressure, ears feel full, dizziness, cough)  I/Betty Walters/Phycian connected withthis patient, Betty Walters, by telephone from my officeat the patient's home.  I verified that I am speaking with the correct person using two identifiers. This visit was conducted via telephone due to the Covid-19 outbreak from my office at [facility] in [place], [place]. I discussed the limitations, risks, security and privacy concerns of performing an evaluation and management service by telephone. I also discussed with the patient that there may be a patient responsible charge related to this service. The patient expressed understanding and agreed to proceed.  Sinusitis This is a new problem. The current episode started in the past 7 days. There has been no fever. Associated symptoms include congestion, coughing, headaches and sinus pressure. Pertinent negatives include no chills, diaphoresis, ear pain, hoarse voice, neck pain, shortness of breath, sneezing, sore throat or swollen glands. Past treatments include oral decongestants. The treatment provided no relief.    Lab Results  Component Value Date   CREATININE 0.90 04/25/2020   BUN 19 04/25/2020   NA 136 04/25/2020   K 4.5 04/25/2020   CL 100 04/25/2020   CO2 17 (L) 04/25/2020   Lab Results  Component Value Date   CHOL 229 (H) 04/25/2020   HDL 58 04/25/2020   LDLCALC 134 (H) 04/25/2020   TRIG 209 (H) 04/25/2020   CHOLHDL 4.6 (H) 08/05/2018   No results found for: TSH Lab Results  Component Value Date   HGBA1C 7.4 (H) 04/25/2020   Lab Results  Component Value Date   WBC 9.7 02/05/2017   HGB 13.1 02/05/2017   HCT 38.9 02/05/2017   MCV 86.1 02/05/2017   PLT 301 02/05/2017   Lab Results  Component Value Date   ALT 18 03/24/2019   AST 12  03/24/2019   ALKPHOS 117 03/24/2019   BILITOT 0.3 03/24/2019     Review of Systems  Constitutional: Negative.  Negative for chills, diaphoresis, fatigue, fever and unexpected weight change.  HENT: Positive for congestion and sinus pressure. Negative for ear discharge, ear pain, hoarse voice, rhinorrhea, sneezing and sore throat.   Eyes: Negative for double vision, photophobia, pain, discharge, redness and itching.  Respiratory: Positive for cough. Negative for shortness of breath, wheezing and stridor.   Gastrointestinal: Negative for abdominal pain, blood in stool, constipation, diarrhea, nausea and vomiting.  Endocrine: Negative for cold intolerance, heat intolerance, polydipsia, polyphagia and polyuria.  Genitourinary: Negative for dysuria, flank pain, frequency, hematuria, menstrual problem, pelvic pain, urgency, vaginal bleeding and vaginal discharge.  Musculoskeletal: Negative for arthralgias, back pain, myalgias and neck pain.  Skin: Negative for rash.  Allergic/Immunologic: Negative for environmental allergies and food allergies.  Neurological: Positive for headaches. Negative for dizziness, weakness, light-headedness and numbness.  Hematological: Negative for adenopathy. Does not bruise/bleed easily.  Psychiatric/Behavioral: Negative for dysphoric mood. The patient is not nervous/anxious.     Patient Active Problem List   Diagnosis Date Noted  . Heartburn   . Dyspepsia   . Change in bowel habits   . Benign neoplasm of descending colon   . Chronic pain of toes of both feet 09/20/2016  . Type 2 diabetes mellitus with complication ([place]) 83/38/2505  . Allergic rhinitis due to pollen 06/21/2015  . Concussion with coma 10/21/2014  .  Clinical depression 09/28/2014  . Diabetes mellitus, type 2 ([place]) 09/28/2014  . HLD (hyperlipidemia) 09/28/2014  . Essential hypertension 09/28/2014  . Adiposity 09/28/2014    Allergies  Allergen Reactions  . Amoxicillin-Pot Clavulanate Nausea  And Vomiting    (OK with Amoxicillin alone)    Past Surgical History:  Procedure Laterality Date  . CESAREAN SECTION    . COLONOSCOPY WITH PROPOFOL N/A 04/11/2017   Procedure: COLONOSCOPY WITH colon;  Surgeon: Lucilla Lame, MD;  Location: [facility];  Service: Endoscopy;  Laterality: N/A;  . ESOPHAGOGASTRODUODENOSCOPY (EGD) WITH PROPOFOL N/A 04/11/2017   Procedure: ESOPHAGOGASTRODUODENOSCOPY (EGD) WITH PROPOFOL;  Surgeon: Lucilla Lame, MD;  Location: [facility] [place];  Service: Endoscopy;  Laterality: N/A;  Diabetic - oral meds  . GALLBLADDER SURGERY    . KNEE SURGERY    . MYRINGOTOMY WITH TUBE PLACEMENT Right 03/22/2020   Procedure: MYRINGOTOMY WITH TUBE PLACEMENT;  Surgeon: Carloyn Manner, MD;  Location: [place];  Service: ENT;  Laterality: Right;  Diabetic  . POLYPECTOMY  04/11/2017   Procedure: POLYPECTOMY INTESTINAL;  Surgeon: Lucilla Lame, MD;  Location: [place];  Service: Endoscopy;;  . WRIST SURGERY      Social History   Tobacco Use  . Smoking status: Never Smoker  . Smokeless tobacco: Never Used  Vaping Use  . Vaping Use: Never used  Substance Use Topics  . Alcohol use: Yes    Alcohol/week: 0.0 standard drinks    Comment: rare - Holidays  . Drug use: No     Medication list has been reviewed and updated.  Current Meds  Medication Sig  . celecoxib (CELEBREX) 200 MG capsule Take 200 mg by mouth daily. ortho  . gabapentin (NEURONTIN) 300 MG capsule Take 300 mg by mouth at bedtime. Para March  . Insulin Pen Needle (PEN NEEDLES 3/16") 31G X 5 MM MISC 1 each by Does not apply route daily.  . lansoprazole (PREVACID) 30 MG capsule Take 1 capsule (30 mg total) by mouth 2 (two) times daily before a meal.  . liraglutide (VICTOZA) 18 MG/3ML SOPN INJECT 1.8MG  INTO THE SKIN ONCE DAILY  . lisinopril (ZESTRIL) 20 MG tablet Take 1 tablet (20 mg total) by mouth daily.  Marland Kitchen lovastatin (MEVACOR) 40 MG tablet TAKE 1 TABLET BY MOUTH ONCE DAILY. OFFICE  VISIT NEEDED FOR FURTHER REFILLS.  . metFORMIN (GLUCOPHAGE) 1000 MG tablet Take 1 tablet (1,000 mg total) by mouth 2 (two) times daily.  . pioglitazone (ACTOS) 15 MG tablet TAKE 1 TABLET BY MOUTH ONCE DAILY. OFFICE VISIT NEEDED FOR FURTHER REFILLS.  . Probiotic Product (PROBIOTIC ADVANCED) CAPS Take 1 capsule by mouth daily.  . sertraline (ZOLOFT) 100 MG tablet TAKE 1 TABLET BY MOUTH ONCE DAILY ALONG WITH 50MG  TABLET FOR TOTAL DAILY DOSE OF 150MG   . sertraline (ZOLOFT) 50 MG tablet TAKE 1 TABLET BY MOUTH ONCE DAILY ALONG WITH 100MG  TABLET FOR TOTAL DAILY DOSE OF 150MG . APPOINTMENT NEEDED BEFORE 30DAYS ARE UP    PHQ 2/9 Scores 04/25/2020 08/23/2019 06/01/2019 03/24/2019  PHQ - 2 Score 0 0 0 1  PHQ- 9 Score 0 3 4 4     GAD 7 : Generalized Anxiety Score 04/25/2020 08/23/2019 06/01/2019 03/24/2019  Nervous, Anxious, on Edge 1 1 0 1  Control/stop worrying 1 1 0 2  Worry too much - different things 0 1 0 1  Trouble relaxing 0 0 0 0  Restless 0 0 1 0  Easily annoyed or irritable 0 1 0 2  Afraid - awful might  happen 0 2 0 1  Total GAD 7 Score 2 6 1 7   Anxiety Difficulty - Not difficult at all Not difficult at all Not difficult at all    BP Readings from Last 3 Encounters:  04/25/20 138/62  03/22/20 125/61  03/01/20 129/68    Physical Exam Vitals and nursing note reviewed.  HENT:     Nose:     Right Turbinates: Not enlarged.     Left Turbinates: Not enlarged.     Right Sinus: Maxillary sinus tenderness present. No frontal sinus tenderness.     Left Sinus: Maxillary sinus tenderness present. No frontal sinus tenderness.     Wt Readings from Last 3 Encounters:  04/25/20 229 lb (103.9 kg)  03/22/20 230 lb (104.3 kg)  03/01/20 220 lb (99.8 kg)    There were no vitals taken for this visit.  Assessment and Plan: 1. Acute maxillary sinusitis, recurrence not specified New onset.  Patient is recovering from Covid.  Now has some productivity to her postnasal drainage with cough and discomfort  in the maxillary sinus areas.  There is tenderness over these areas as well on self exam.  We will treat for a maxillary sinusitis with a azithromycin to 50 mg 2 today followed by 1 a day for 4 days. - azithromycin (ZITHROMAX) 250 MG tablet; 2 today then 1 a day for 4 days  Dispense: 6 tablet; Refill: 0  2. Cough Patient has had a cough and has been suggested that she use Mucinex DM during the day and to use her Robitussin-AC primarily at night or over the weekend when she is not having to be in the presence of other individuals that are to be driving. - guaiFENesin-codeine (ROBITUSSIN AC) 100-10 MG/5ML syrup; Take 5 mLs by mouth 4 (four) times daily as needed for cough.  Dispense: 118 mL; Refill: 0  I spent 12 minutes with this patient, More than 50% of that time was spent in face to face education, counseling and care coordination.

## 2020-05-17 ENCOUNTER — Ambulatory Visit: Payer: BC Managed Care – PPO | Admitting: Family Medicine

## 2020-05-29 ENCOUNTER — Ambulatory Visit (INDEPENDENT_AMBULATORY_CARE_PROVIDER_SITE_OTHER): Payer: BC Managed Care – PPO | Admitting: Gastroenterology

## 2020-05-29 ENCOUNTER — Other Ambulatory Visit: Payer: Self-pay

## 2020-05-29 ENCOUNTER — Encounter: Payer: Self-pay | Admitting: Gastroenterology

## 2020-05-29 VITALS — BP 128/73 | HR 89 | Temp 97.8°F | Ht 68.0 in | Wt 232.4 lb

## 2020-05-29 DIAGNOSIS — R140 Abdominal distension (gaseous): Secondary | ICD-10-CM

## 2020-05-29 DIAGNOSIS — K589 Irritable bowel syndrome without diarrhea: Secondary | ICD-10-CM | POA: Diagnosis not present

## 2020-05-29 MED ORDER — DICYCLOMINE HCL 20 MG PO TABS: 20.0000 mg | ORAL_TABLET | Freq: Three times a day (TID) | ORAL | 3 refills | Status: DC

## 2020-05-29 MED ORDER — PANTOPRAZOLE SODIUM 40 MG PO TBEC
40.0000 mg | DELAYED_RELEASE_TABLET | Freq: Every day | ORAL | 6 refills | Status: DC
Start: 2020-05-29 — End: 2020-12-18

## 2020-05-29 NOTE — Patient Instructions (Signed)
Low-FODMAP Eating Plan  FODMAP stands for fermentable oligosaccharides, disaccharides, monosaccharides, and polyols. These are sugars that are hard for some people to digest. A low-FODMAP eating plan may help some people who have irritable bowel syndrome (IBS) and certain other bowel (intestinal) diseases to manage their symptoms. This meal plan can be complicated to follow. Work with a diet and nutrition specialist (dietitian) to make a low-FODMAP eating plan that is right for you. A dietitian can help make sure that you get enough nutrition from this diet. What are tips for following this plan? Reading food labels  Check labels for hidden FODMAPs such as: ? High-fructose syrup. ? Honey. ? Agave. ? Natural fruit flavors. ? Onion or garlic powder.  Choose low-FODMAP foods that contain 3-4 grams of fiber per serving.  Check food labels for serving sizes. Eat only one serving at a time to make sure FODMAP levels stay low. Shopping  Shop with a list of foods that are recommended on this diet and make a meal plan. Meal planning  Follow a low-FODMAP eating plan for up to 6 weeks, or as told by your health care provider or dietitian.  To follow the eating plan: 1. Eliminate high-FODMAP foods from your diet completely. Choose only low-FODMAP foods to eat. You will do this for 2-6 weeks. 2. Gradually reintroduce high-FODMAP foods into your diet one at a time. Most people should wait a few days before introducing the next new high-FODMAP food into their meal plan. Your dietitian can recommend how quickly you may reintroduce foods. 3. Keep a daily record of what and how much you eat and drink. Make note of any symptoms that you have after eating. 4. Review your daily record with a dietitian regularly to identify which foods you can eat and which foods you should avoid. General tips  Drink enough fluid each day to keep your urine pale yellow.  Avoid processed foods. These often have added sugar  and may be high in FODMAPs.  Avoid most dairy products, whole grains, and sweeteners.  Work with a dietitian to make sure you get enough fiber in your diet.  Avoid high FODMAP foods at meals to manage symptoms. Recommended foods Fruits Bananas, oranges, tangerines, lemons, limes, blueberries, raspberries, strawberries, grapes, cantaloupe, honeydew melon, kiwi, papaya, passion fruit, and pineapple. Limited amounts of dried cranberries, banana chips, and shredded coconut. Vegetables Eggplant, zucchini, cucumber, peppers, green beans, bean sprouts, lettuce, arugula, kale, Swiss chard, spinach, collard greens, bok choy, summer squash, potato, and tomato. Limited amounts of corn, carrot, and sweet potato. Green parts of scallions. Grains Gluten-free grains, such as rice, oats, buckwheat, quinoa, corn, polenta, and millet. Gluten-free pasta, bread, or cereal. Rice noodles. Corn tortillas. Meats and other proteins Unseasoned beef, pork, poultry, or fish. Eggs. Berniece Salines. Tofu (firm) and tempeh. Limited amounts of nuts and seeds, such as almonds, walnuts, [place] nuts, pecans, peanuts, nut butters, pumpkin seeds, chia seeds, and sunflower seeds. Dairy Lactose-free milk, yogurt, and kefir. Lactose-free cottage cheese and ice cream. Non-dairy milks, such as almond, coconut, hemp, and rice milk. Non-dairy yogurt. Limited amounts of goat cheese, brie, mozzarella, parmesan, swiss, and other hard cheeses. Fats and oils Butter-free spreads. Vegetable oils, such as olive, canola, and sunflower oil. Seasoning and other foods Artificial sweeteners with names that do not end in "ol," such as aspartame, saccharine, and stevia. Maple syrup, white table sugar, raw sugar, brown sugar, and molasses. Mayonnaise, soy sauce, and tamari. Fresh basil, coriander, parsley, rosemary, and thyme. Beverages Water and  mineral water. Sugar-sweetened soft drinks. Small amounts of orange juice or cranberry juice. Black and green tea.  Most dry wines. Coffee. The items listed above may not be a complete list of foods and beverages you can eat. Contact a dietitian for more information. Foods to avoid Fruits Fresh, dried, and juiced forms of apple, pear, watermelon, peach, plum, cherries, apricots, blackberries, boysenberries, figs, nectarines, and mango. Avocado. Vegetables Chicory root, artichoke, asparagus, cabbage, snow peas, Brussels sprouts, broccoli, sugar snap peas, mushrooms, celery, and cauliflower. Onions, garlic, leeks, and the white part of scallions. Grains Wheat, including kamut, durum, and semolina. Barley and bulgur. Couscous. Wheat-based cereals. Wheat noodles, bread, crackers, and pastries. Meats and other proteins Fried or fatty meat. Sausage. Cashews and pistachios. Soybeans, baked beans, black beans, chickpeas, kidney beans, fava beans, navy beans, lentils, black-eyed peas, and split peas. Dairy Milk, yogurt, ice cream, and soft cheese. Cream and sour cream. Milk-based sauces. Custard. Buttermilk. Soy milk. Seasoning and other foods Any sugar-free gum or candy. Foods that contain artificial sweeteners such as sorbitol, mannitol, isomalt, or xylitol. Foods that contain honey, high-fructose corn syrup, or agave. Bouillon, vegetable stock, beef stock, and chicken stock. Garlic and onion powder. Condiments made with onion, such as hummus, chutney, pickles, relish, salad dressing, and salsa. Tomato paste. Beverages Chicory-based drinks. Coffee substitutes. Chamomile tea. Fennel tea. Sweet or fortified wines such as port or sherry. Diet soft drinks made with isomalt, mannitol, maltitol, sorbitol, or xylitol. Apple, pear, and mango juice. Juices with high-fructose corn syrup. The items listed above may not be a complete list of foods and beverages you should avoid. Contact a dietitian for more information. Summary  FODMAP stands for fermentable oligosaccharides, disaccharides, monosaccharides, and polyols. These  are sugars that are hard for some people to digest.  A low-FODMAP eating plan is a short-term diet that helps to ease symptoms of certain bowel diseases.  The eating plan usually lasts up to 6 weeks. After that, high-FODMAP foods are reintroduced gradually and one at a time. This can help you find out which foods may be causing symptoms.  A low-FODMAP eating plan can be complicated. It is best to work with a dietitian who has experience with this type of plan. This information is not intended to replace advice given to you by your health care provider. Make sure you discuss any questions you have with your health care provider. Document Revised: 08/05/2019 Document Reviewed: 08/05/2019 Elsevier Patient Education  [place].

## 2020-05-29 NOTE — Progress Notes (Signed)
Primary Care Physician: Juline Patch, MD  Primary Gastroenterologist:  Dr. Lucilla Lame  Chief Complaint  Patient presents with  . Irritable Bowel Syndrome    HPI: Betty Walters is a 60 y.o. female here after seeing me in 2018 for abdominal pain and change in bowel habits.  The patient underwent an EGD and colonoscopy with biopsies and no source of her symptoms to be found.  With her alternating diarrhea and constipation it was thought that irritable bowel syndrome was most likely.  The patient then saw me in 2019 for the EGD and colonoscopy and followed up back then for GERD.  The patient's biopsies of the midesophagus showed chronic reflux changes.  The patient called the office at the end of January and reported "having problems with her IBS. Having diarrhea and nausea w/ a sulfur taste in her mouth and bloating". The patient denies any unexplained weight loss, fevers, chills, nausea or vomiting.  She does report that she has bloating.  She denies having any increased stress in her life but then reports multiple issues going on in her family.  Past Medical History:  Diagnosis Date  . Concussion May 2016  . Depression   . Diabetes mellitus without complication ([place])   . Family history of adverse reaction to anesthesia    Father - slow to wake  . GERD (gastroesophageal reflux disease)   . High cholesterol   . Hypertension   . Yeast infection     Current Outpatient Medications  Medication Sig Dispense Refill  . azithromycin (ZITHROMAX) 250 MG tablet 2 today then 1 a day for 4 days 6 tablet 0  . celecoxib (CELEBREX) 200 MG capsule Take 200 mg by mouth daily. ortho  0  . gabapentin (NEURONTIN) 300 MG capsule Take 300 mg by mouth at bedtime. Para March    . guaiFENesin-codeine (ROBITUSSIN AC) 100-10 MG/5ML syrup Take 5 mLs by mouth 4 (four) times daily as needed for cough. 118 mL 0  . Insulin Pen Needle (PEN NEEDLES 3/16") 31G X 5 MM MISC 1 each by Does not apply route daily. 100  each 0  . lansoprazole (PREVACID) 30 MG capsule Take 1 capsule (30 mg total) by mouth 2 (two) times daily before a meal. 60 capsule 1  . liraglutide (VICTOZA) 18 MG/3ML SOPN INJECT 1.8MG  INTO THE SKIN ONCE DAILY 9 mL 8  . lisinopril (ZESTRIL) 20 MG tablet Take 1 tablet (20 mg total) by mouth daily. 90 tablet 1  . lovastatin (MEVACOR) 40 MG tablet TAKE 1 TABLET BY MOUTH ONCE DAILY. OFFICE VISIT NEEDED FOR FURTHER REFILLS. 90 tablet 1  . metFORMIN (GLUCOPHAGE) 1000 MG tablet Take 1 tablet (1,000 mg total) by mouth 2 (two) times daily. 180 tablet 1  . pioglitazone (ACTOS) 15 MG tablet TAKE 1 TABLET BY MOUTH ONCE DAILY. OFFICE VISIT NEEDED FOR FURTHER REFILLS. 90 tablet 1  . Probiotic Product (PROBIOTIC ADVANCED) CAPS Take 1 capsule by mouth daily.    . sertraline (ZOLOFT) 100 MG tablet TAKE 1 TABLET BY MOUTH ONCE DAILY ALONG WITH 50MG  TABLET FOR TOTAL DAILY DOSE OF 150MG  90 tablet 1  . sertraline (ZOLOFT) 50 MG tablet TAKE 1 TABLET BY MOUTH ONCE DAILY ALONG WITH 100MG  TABLET FOR TOTAL DAILY DOSE OF 150MG . APPOINTMENT NEEDED BEFORE 30DAYS ARE UP 90 tablet 1   No current facility-administered medications for this visit.    Allergies as of 05/29/2020 - Review Complete 05/29/2020  Allergen Reaction Noted  . Amoxicillin-pot clavulanate Nausea And  Vomiting 01/26/2014    ROS:  General: Negative for anorexia, weight loss, fever, chills, fatigue, weakness. ENT: Negative for hoarseness, difficulty swallowing , nasal congestion. CV: Negative for chest pain, angina, palpitations, dyspnea on exertion, peripheral edema.  Respiratory: Negative for dyspnea at rest, dyspnea on exertion, cough, sputum, wheezing.  GI: See history of present illness. GU:  Negative for dysuria, hematuria, urinary incontinence, urinary frequency, nocturnal urination.  Endo: Negative for unusual weight change.    Physical Examination:   BP 128/73   Pulse 89   Temp 97.8 F (36.6 C) (Temporal)   Ht 5\' 8"  (1.727 m)   Wt 232  lb 6.4 oz (105.4 kg)   BMI 35.34 kg/m   General: Well-nourished, well-developed in no acute distress.  Eyes: No icterus. Conjunctivae pink. Lungs: Clear to auscultation bilaterally. Non-labored. Heart: Regular rate and rhythm, no murmurs rubs or gallops.  Abdomen: Bowel sounds are normal, nontender, nondistended, no hepatosplenomegaly or masses, no abdominal bruits or hernia , no rebound or guarding.   Extremities: No lower extremity edema. No clubbing or deformities. Neuro: Alert and oriented x 3.  Grossly intact. Skin: Warm and dry, no jaundice.   Psych: Alert and cooperative, normal mood and affect.  Labs:    Imaging Studies: No results found.  Assessment and Plan:   Betty Walters is a 60 y.o. y/o female who comes in today with a history of irritable bowel syndrome.  The patient has been having worsening symptoms with gas and bloating.  The patient has been told to try and avoid foods that may be causing her worse symptoms. The patient is doing much better on the Prevacid but states that she still has some acid breakthrough.  The patient will be started on Protonix 40 mg a day.  The patient also remembers that she may have tried dicyclomine in the past and thinks it helped.  The patient will be started on dicyclomine  20 mg 3 times a day.  She will also increase fiber in her diet And start a low FADMOP.  The patient has been explained the plan and agrees with it.     Lucilla Lame, MD. Marval Regal    Note: This dictation was prepared with Dragon dictation along with smaller phrase technology. Any transcriptional errors that result from this process are unintentional.

## 2020-06-22 ENCOUNTER — Encounter: Payer: Self-pay | Admitting: Family Medicine

## 2020-06-22 ENCOUNTER — Ambulatory Visit: Payer: BC Managed Care – PPO | Admitting: Family Medicine

## 2020-06-22 ENCOUNTER — Other Ambulatory Visit: Payer: Self-pay

## 2020-06-22 VITALS — BP 122/84 | HR 64 | Temp 98.5°F | Ht 68.0 in | Wt 239.0 lb

## 2020-06-22 DIAGNOSIS — R059 Cough, unspecified: Secondary | ICD-10-CM | POA: Diagnosis not present

## 2020-06-22 DIAGNOSIS — J0100 Acute maxillary sinusitis, unspecified: Secondary | ICD-10-CM

## 2020-06-22 MED ORDER — MONTELUKAST SODIUM 10 MG PO TABS: 10.0000 mg | ORAL_TABLET | Freq: Every day | ORAL | 3 refills | Status: DC

## 2020-06-22 MED ORDER — GUAIFENESIN-CODEINE 100-10 MG/5ML PO SYRP: 5.0000 mL | ORAL_SOLUTION | Freq: Four times a day (QID) | ORAL | 0 refills | Status: DC | PRN

## 2020-06-22 MED ORDER — AMOXICILLIN 500 MG PO CAPS: 500.0000 mg | ORAL_CAPSULE | Freq: Three times a day (TID) | ORAL | 0 refills | Status: DC

## 2020-06-22 NOTE — Progress Notes (Signed)
Date:  06/22/2020   Name:  Betty Walters   DOB:  1960/06/18   MRN:  [id number]   Chief Complaint: Cough (Scratchy throat, hoarse, cough, headache, R) ear hurting- had antibiotic drop for ear from ENT. Yellow production)  Cough This is a new problem. The current episode started in the past 7 days. The problem has been gradually worsening. The problem occurs constantly. The cough is productive of purulent sputum. Associated symptoms include headaches, nasal congestion, postnasal drip, rhinorrhea, a sore throat and shortness of breath. Pertinent negatives include no chest pain, chills, ear pain, eye redness, fever, heartburn, myalgias, rash, sweats, weight loss or wheezing. She has tried OTC cough suppressant for the symptoms. The treatment provided no relief. There is no history of environmental allergies.    Lab Results  Component Value Date   CREATININE 0.90 04/25/2020   BUN 19 04/25/2020   NA 136 04/25/2020   K 4.5 04/25/2020   CL 100 04/25/2020   CO2 17 (L) 04/25/2020   Lab Results  Component Value Date   CHOL 229 (H) 04/25/2020   HDL 58 04/25/2020   LDLCALC 134 (H) 04/25/2020   TRIG 209 (H) 04/25/2020   CHOLHDL 4.6 (H) 08/05/2018   No results found for: TSH Lab Results  Component Value Date   HGBA1C 7.4 (H) 04/25/2020   Lab Results  Component Value Date   WBC 9.7 02/05/2017   HGB 13.1 02/05/2017   HCT 38.9 02/05/2017   MCV 86.1 02/05/2017   PLT 301 02/05/2017   Lab Results  Component Value Date   ALT 18 03/24/2019   AST 12 03/24/2019   ALKPHOS 117 03/24/2019   BILITOT 0.3 03/24/2019     Review of Systems  Constitutional: Negative.  Negative for chills, fatigue, fever, unexpected weight change and weight loss.  HENT: Positive for postnasal drip, rhinorrhea and sore throat. Negative for congestion, ear discharge, ear pain, sinus pressure and sneezing.   Eyes: Negative for photophobia, pain, discharge, redness and itching.  Respiratory: Positive for cough  and shortness of breath. Negative for wheezing and stridor.   Cardiovascular: Negative for chest pain, palpitations and leg swelling.  Gastrointestinal: Negative for abdominal pain, blood in stool, constipation, diarrhea, heartburn, nausea and vomiting.  Endocrine: Negative for cold intolerance, heat intolerance, polydipsia, polyphagia and polyuria.  Genitourinary: Negative for dysuria, flank pain, frequency, hematuria, menstrual problem, pelvic pain, urgency, vaginal bleeding and vaginal discharge.  Musculoskeletal: Negative for arthralgias, back pain and myalgias.  Skin: Negative for rash.  Allergic/Immunologic: Negative for environmental allergies and food allergies.  Neurological: Positive for headaches. Negative for dizziness, weakness, light-headedness and numbness.  Hematological: Negative for adenopathy. Does not bruise/bleed easily.  Psychiatric/Behavioral: Negative for dysphoric mood. The patient is not nervous/anxious.     Patient Active Problem List   Diagnosis Date Noted  . Heartburn   . Dyspepsia   . Change in bowel habits   . Benign neoplasm of descending colon   . Chronic pain of toes of both feet 09/20/2016  . Type 2 diabetes mellitus with complication ([place]) 56/31/4970  . Allergic rhinitis due to pollen 06/21/2015  . Concussion with coma 10/21/2014  . Clinical depression 09/28/2014  . Diabetes mellitus, type 2 ([place]) 09/28/2014  . HLD (hyperlipidemia) 09/28/2014  . Essential hypertension 09/28/2014  . Adiposity 09/28/2014    Allergies  Allergen Reactions  . Amoxicillin-Pot Clavulanate Nausea And Vomiting    (OK with Amoxicillin alone)    Past Surgical History:  Procedure Laterality Date  .  CESAREAN SECTION    . COLONOSCOPY WITH PROPOFOL N/A 04/11/2017   Procedure: COLONOSCOPY WITH colon;  Surgeon: Lucilla Lame, MD;  Location: [facility];  Service: Endoscopy;  Laterality: N/A;  . ESOPHAGOGASTRODUODENOSCOPY (EGD) WITH PROPOFOL N/A 04/11/2017   Procedure:  ESOPHAGOGASTRODUODENOSCOPY (EGD) WITH PROPOFOL;  Surgeon: Lucilla Lame, MD;  Location: [facility];  Service: Endoscopy;  Laterality: N/A;  Diabetic - oral meds  . GALLBLADDER SURGERY    . KNEE SURGERY    . MYRINGOTOMY WITH TUBE PLACEMENT Right 03/22/2020   Procedure: MYRINGOTOMY WITH TUBE PLACEMENT;  Surgeon: Carloyn Manner, MD;  Location: [facility];  Service: ENT;  Laterality: Right;  Diabetic  . POLYPECTOMY  04/11/2017   Procedure: POLYPECTOMY INTESTINAL;  Surgeon: Lucilla Lame, MD;  Location: [facility];  Service: Endoscopy;;  . WRIST SURGERY      Social History   Tobacco Use  . Smoking status: Never Smoker  . Smokeless tobacco: Never Used  Vaping Use  . Vaping Use: Never used  Substance Use Topics  . Alcohol use: Yes    Alcohol/week: 0.0 standard drinks    Comment: rare - Holidays  . Drug use: No     Medication list has been reviewed and updated.  Current Meds  Medication Sig  . celecoxib (CELEBREX) 200 MG capsule Take 200 mg by mouth daily. ortho  . dicyclomine (BENTYL) 20 MG tablet Take 1 tablet (20 mg total) by mouth 3 (three) times daily before meals.  . gabapentin (NEURONTIN) 300 MG capsule Take 300 mg by mouth at bedtime. Para March  . Insulin Pen Needle (PEN NEEDLES 3/16") 31G X 5 MM MISC 1 each by Does not apply route daily.  . lansoprazole (PREVACID) 30 MG capsule Take 1 capsule (30 mg total) by mouth 2 (two) times daily before a meal.  . liraglutide (VICTOZA) 18 MG/3ML SOPN INJECT 1.8MG  INTO THE SKIN ONCE DAILY  . lisinopril (ZESTRIL) 20 MG tablet Take 1 tablet (20 mg total) by mouth daily.  Marland Kitchen lovastatin (MEVACOR) 40 MG tablet TAKE 1 TABLET BY MOUTH ONCE DAILY. OFFICE VISIT NEEDED FOR FURTHER REFILLS.  . metFORMIN (GLUCOPHAGE) 1000 MG tablet Take 1 tablet (1,000 mg total) by mouth 2 (two) times daily.  . pantoprazole (PROTONIX) 40 MG tablet Take 1 tablet (40 mg total) by mouth daily.  . pioglitazone (ACTOS) 15 MG tablet TAKE 1 TABLET BY  MOUTH ONCE DAILY. OFFICE VISIT NEEDED FOR FURTHER REFILLS.  . Probiotic Product (PROBIOTIC ADVANCED) CAPS Take 1 capsule by mouth daily.  . sertraline (ZOLOFT) 100 MG tablet TAKE 1 TABLET BY MOUTH ONCE DAILY ALONG WITH 50MG  TABLET FOR TOTAL DAILY DOSE OF 150MG   . sertraline (ZOLOFT) 50 MG tablet TAKE 1 TABLET BY MOUTH ONCE DAILY ALONG WITH 100MG  TABLET FOR TOTAL DAILY DOSE OF 150MG . APPOINTMENT NEEDED BEFORE 30DAYS ARE UP    PHQ 2/9 Scores 06/22/2020 04/25/2020 08/23/2019 06/01/2019  PHQ - 2 Score 1 0 0 0  PHQ- 9 Score 1 0 3 4    GAD 7 : Generalized Anxiety Score 06/22/2020 04/25/2020 08/23/2019 06/01/2019  Nervous, Anxious, on Edge 0 1 1 0  Control/stop worrying 2 1 1  0  Worry too much - different things 1 0 1 0  Trouble relaxing 0 0 0 0  Restless 0 0 0 1  Easily annoyed or irritable 0 0 1 0  Afraid - awful might happen 0 0 2 0  Total GAD 7 Score 3 2 6 1   Anxiety Difficulty Somewhat difficult - Not  difficult at all Not difficult at all    BP Readings from Last 3 Encounters:  06/22/20 122/84  05/29/20 128/73  04/25/20 138/62    Physical Exam Vitals reviewed.  Constitutional:      General: She is not in acute distress.    Appearance: She is not diaphoretic.  HENT:     Head: Normocephalic and atraumatic.     Right Ear: Tympanic membrane, ear canal and external ear normal.     Left Ear: Tympanic membrane, ear canal and external ear normal.     Nose: Nose normal. No congestion or rhinorrhea.     Mouth/Throat:     Mouth: Mucous membranes are moist.  Eyes:     General:        Right eye: No discharge.        Left eye: No discharge.     Conjunctiva/sclera: Conjunctivae normal.     Pupils: Pupils are equal, round, and reactive to light.  Neck:     Thyroid: No thyromegaly.     Vascular: No JVD.  Cardiovascular:     Rate and Rhythm: Normal rate and regular rhythm.     Heart sounds: Normal heart sounds. No murmur heard. No friction rub. No gallop.   Pulmonary:     Effort: Pulmonary  effort is normal.     Breath sounds: Normal breath sounds. No wheezing, rhonchi or rales.  Abdominal:     General: Bowel sounds are normal.     Palpations: Abdomen is soft. There is no mass.     Tenderness: There is no abdominal tenderness. There is no guarding.  Musculoskeletal:        General: Normal range of motion.     Cervical back: Normal range of motion and neck supple.  Lymphadenopathy:     Cervical: No cervical adenopathy.  Skin:    General: Skin is warm and dry.  Neurological:     General: No focal deficit present.     Mental Status: She is alert.     Deep Tendon Reflexes: Reflexes are normal and symmetric.     Wt Readings from Last 3 Encounters:  06/22/20 239 lb (108.4 kg)  05/29/20 232 lb 6.4 oz (105.4 kg)  04/25/20 229 lb (103.9 kg)    BP 122/84   Pulse 64   Temp 98.5 F (36.9 C) (Oral)   Ht 5\' 8"  (1.727 m)   Wt 239 lb (108.4 kg)   SpO2 98%   BMI 36.34 kg/m   Assessment and Plan: 1. Acute maxillary sinusitis, recurrence not specified New onset.  Episodic.  Patient has had issues with sinus about this time every year.  We will begin with amoxicillin 500 mg 3 times a day for 10 days and will initiate Singulair 10 mg once a day for rhinitis and wheezing. - amoxicillin (AMOXIL) 500 MG capsule; Take 1 capsule (500 mg total) by mouth 3 (three) times daily.  Dispense: 30 capsule; Refill: 0 - montelukast (SINGULAIR) 10 MG tablet; Take 1 tablet (10 mg total) by mouth at bedtime.  Dispense: 30 tablet; Refill: 3  2. Cough New onset.  Persistent.  Particularly at night.  Patient's been instructed to use Mucinex DM during the day and Robitussin-AC 1 teaspoon every 6-8 hours in evening or at home circumstances. - guaiFENesin-codeine (ROBITUSSIN AC) 100-10 MG/5ML syrup; Take 5 mLs by mouth 4 (four) times daily as needed for cough.  Dispense: 118 mL; Refill: 0

## 2020-08-22 ENCOUNTER — Other Ambulatory Visit: Payer: Self-pay

## 2020-08-22 ENCOUNTER — Encounter: Payer: Self-pay | Admitting: Podiatry

## 2020-08-22 ENCOUNTER — Other Ambulatory Visit: Payer: Self-pay | Admitting: Podiatry

## 2020-08-22 ENCOUNTER — Ambulatory Visit (INDEPENDENT_AMBULATORY_CARE_PROVIDER_SITE_OTHER): Payer: BC Managed Care – PPO

## 2020-08-22 ENCOUNTER — Ambulatory Visit: Payer: BC Managed Care – PPO | Admitting: Podiatry

## 2020-08-22 DIAGNOSIS — M722 Plantar fascial fibromatosis: Secondary | ICD-10-CM | POA: Diagnosis not present

## 2020-08-22 DIAGNOSIS — M7662 Achilles tendinitis, left leg: Secondary | ICD-10-CM | POA: Diagnosis not present

## 2020-08-22 MED ORDER — GABAPENTIN 300 MG PO CAPS: 300.0000 mg | ORAL_CAPSULE | Freq: Every day | ORAL | 1 refills | Status: DC

## 2020-08-22 MED ORDER — CELECOXIB 200 MG PO CAPS: 200.0000 mg | ORAL_CAPSULE | Freq: Two times a day (BID) | ORAL | 1 refills | Status: DC

## 2020-08-22 NOTE — Progress Notes (Signed)
   HPI: 60 y.o. female presenting today as an established patient for new complaint regarding pain and tenderness to the left heel both plantar and posterior.  Is been going on for several months.  She is not currently not done anything for treatment.  Pain is exacerbated when walking.  She denies a history of injury or any incident that would have elicited her pain.  She presents for further treatment and evaluation    Past Medical History:  Diagnosis Date  . Concussion May 2016  . Depression   . Diabetes mellitus without complication ([place])   . Family history of adverse reaction to anesthesia    Father - slow to wake  . GERD (gastroesophageal reflux disease)   . High cholesterol   . Hypertension   . Yeast infection       Physical Exam: General: The patient is alert and oriented x3 in no acute distress.  Dermatology: Skin is warm, dry and supple bilateral lower extremities. Negative for open lesions or macerations.  Vascular: Palpable pedal pulses bilaterally. No edema or erythema noted. Capillary refill within normal limits.  Neurological: Epicritic and protective threshold diminished bilaterally.   Musculoskeletal Exam: Pain on palpation noted to the posterior tubercle of the left calcaneus at the insertion of the Achilles tendon consistent with retrocalcaneal bursitis.  There is also pain on palpation along the plantar fascia as it inserts onto the calcaneus consistent with findings of a plantar fasciitis.  Range of motion within normal limits. Muscle strength 5/5 in all muscle groups bilateral lower extremities.  Radiographic Exam:  Posterior and plantar calcaneal spur noted to the respective calcaneus on lateral view. No fracture or dislocation noted. Normal osseous mineralization noted.     Assessment: 1. Insertional Achilles tendinitis left 2.  Plantar fasciitis left 3.  Diabetes mellitus with peripheral polyneuropathy   Plan of Care:  1. Patient was evaluated.  Radiographs were reviewed today. 2. Injection of 0.5 mL Celestone Soluspan injected into the retrocalcaneal bursa. Care was taken to avoid direct injection into the Achilles tendon. 3.  Injection of 0.5 cc Celestone Soluspan injected into the plantar fascia left 4.  No Medrol Dosepak prescribed.  Patient states that it elevates her sugar levels 5.  Refill prescription for Celebrex 100 mg 2 times daily.  Patient states that she takes Celebrex for left knee pain.  #120 with 1 refill 6.  Prescription for gabapentin 300 mg nightly #90 with 1 refill 7.  Cam boot dispensed.  Weightbearing as tolerated x1 month 8.  Recommend daily stretching exercises 9.  Return to clinic in 4 weeks  *Optometrist at [facility]    Edrick Kins, DPM [facility]  Dr. Edrick Kins, DPM    [address], [place] [address]                Office [phone]  Fax [phone]

## 2020-08-23 ENCOUNTER — Ambulatory Visit: Payer: BC Managed Care – PPO | Admitting: Family Medicine

## 2020-08-23 ENCOUNTER — Ambulatory Visit: Payer: Self-pay | Admitting: Podiatry

## 2020-09-07 ENCOUNTER — Ambulatory Visit: Payer: BC Managed Care – PPO | Admitting: Family Medicine

## 2020-09-11 ENCOUNTER — Ambulatory Visit: Payer: BC Managed Care – PPO | Admitting: Family Medicine

## 2020-09-13 ENCOUNTER — Ambulatory Visit: Payer: BC Managed Care – PPO | Admitting: Family Medicine

## 2020-09-13 ENCOUNTER — Other Ambulatory Visit: Payer: Self-pay

## 2020-09-13 ENCOUNTER — Encounter: Payer: Self-pay | Admitting: Family Medicine

## 2020-09-13 VITALS — BP 130/80 | HR 88 | Ht 68.0 in | Wt 232.0 lb

## 2020-09-13 DIAGNOSIS — E119 Type 2 diabetes mellitus without complications: Secondary | ICD-10-CM

## 2020-09-13 NOTE — Progress Notes (Signed)
Date:  09/13/2020   Name:  Betty Walters   DOB:  [birth year]   MRN:  [id number]   Chief Complaint: Diabetes  Diabetes She presents for her follow-up diabetic visit. She has type 2 diabetes mellitus. Her disease course has been stable. Hypoglycemia symptoms include nervousness/anxiousness. Pertinent negatives for hypoglycemia include no confusion, dizziness, headaches, hunger, mood changes, pallor, seizures, sleepiness, speech difficulty, sweats or tremors. There are no diabetic associated symptoms. Pertinent negatives for diabetes include no chest pain and no polydipsia. There are no hypoglycemic complications. Symptoms are stable. There are no diabetic complications. Risk factors for coronary artery disease include dyslipidemia and hypertension. Current diabetic treatment includes oral agent (dual therapy) (victoza). She is following a generally healthy diet. Meal planning includes avoidance of concentrated sweets and carbohydrate counting. She participates in exercise intermittently. Her home blood glucose trend is fluctuating minimally. Her breakfast blood glucose is taken between 8-9 am. Her breakfast blood glucose range is generally 140-180 mg/dl. An ACE inhibitor/angiotensin II receptor blocker is being taken.   Lab Results  Component Value Date   CREATININE 0.90 04/25/2020   BUN 19 04/25/2020   NA 136 04/25/2020   K 4.5 04/25/2020   CL 100 04/25/2020   CO2 17 (L) 04/25/2020   Lab Results  Component Value Date   CHOL 229 (H) 04/25/2020   HDL 58 04/25/2020   LDLCALC 134 (H) 04/25/2020   TRIG 209 (H) 04/25/2020   CHOLHDL 4.6 (H) 08/05/2018   No results found for: TSH Lab Results  Component Value Date   HGBA1C 7.4 (H) 04/25/2020   Lab Results  Component Value Date   WBC 9.7 02/05/2017   HGB 13.1 02/05/2017   HCT 38.9 02/05/2017   MCV 86.1 02/05/2017   PLT 301 02/05/2017   Lab Results  Component Value Date   ALT 18 03/24/2019   AST 12 03/24/2019   ALKPHOS 117  03/24/2019   BILITOT 0.3 03/24/2019     Review of Systems  Constitutional:  Negative for chills and fever.  HENT:  Negative for drooling, ear discharge, ear pain and sore throat.   Respiratory:  Negative for cough, shortness of breath and wheezing.   Cardiovascular:  Negative for chest pain, palpitations and leg swelling.  Gastrointestinal:  Negative for abdominal pain, blood in stool, constipation, diarrhea and nausea.  Endocrine: Negative for polydipsia.  Genitourinary:  Negative for dysuria, frequency, hematuria and urgency.  Musculoskeletal:  Negative for back pain, myalgias and neck pain.  Skin:  Negative for pallor and rash.  Allergic/Immunologic: Negative for environmental allergies.  Neurological:  Negative for dizziness, tremors, seizures, speech difficulty and headaches.  Hematological:  Does not bruise/bleed easily.  Psychiatric/Behavioral:  Negative for confusion and suicidal ideas. The patient is nervous/anxious.    Patient Active Problem List   Diagnosis Date Noted   Sprain, metatarsophalangeal joint 01/26/2018   Heartburn    Dyspepsia    Change in bowel habits    Benign neoplasm of descending colon    Chronic pain of toes of both feet 09/20/2016   Type 2 diabetes mellitus with complication ([place]) 35/57/3220   Allergic rhinitis due to pollen 06/21/2015   Concussion with coma 10/21/2014   Clinical depression 09/28/2014   Diabetes mellitus, type 2 ([place]) 09/28/2014   HLD (hyperlipidemia) 09/28/2014   Essential hypertension 09/28/2014   Adiposity 09/28/2014    Allergies  Allergen Reactions   Amoxicillin-Pot Clavulanate Nausea And Vomiting    (OK with Amoxicillin alone)    Past  Surgical History:  Procedure Laterality Date   CESAREAN SECTION     COLONOSCOPY WITH PROPOFOL N/A 04/11/2017   Procedure: COLONOSCOPY WITH colon;  Surgeon: Lucilla Lame, MD;  Location: [facility];  Service: Endoscopy;  Laterality: N/A;   ESOPHAGOGASTRODUODENOSCOPY (EGD) WITH  PROPOFOL N/A 04/11/2017   Procedure: ESOPHAGOGASTRODUODENOSCOPY (EGD) WITH PROPOFOL;  Surgeon: Lucilla Lame, MD;  Location: [place];  Service: Endoscopy;  Laterality: N/A;  Diabetic - oral meds   GALLBLADDER SURGERY     KNEE SURGERY     MYRINGOTOMY WITH TUBE PLACEMENT Right 03/22/2020   Procedure: MYRINGOTOMY WITH TUBE PLACEMENT;  Surgeon: Carloyn Manner, MD;  Location: [facility];  Service: ENT;  Laterality: Right;  Diabetic   POLYPECTOMY  04/11/2017   Procedure: POLYPECTOMY INTESTINAL;  Surgeon: Lucilla Lame, MD;  Location: [place] [facility];  Service: Endoscopy;;   WRIST SURGERY      Social History   Tobacco Use   Smoking status: Never   Smokeless tobacco: Never  Vaping Use   Vaping Use: Never used  Substance Use Topics   Alcohol use: Yes    Alcohol/week: 0.0 standard drinks    Comment: rare - Holidays   Drug use: No     Medication list has been reviewed and updated.  Current Meds  Medication Sig   celecoxib (CELEBREX) 200 MG capsule Take 1 capsule (200 mg total) by mouth 2 (two) times daily. ortho   cyclobenzaprine (FLEXERIL) 5 MG tablet Take 5 mg by mouth at bedtime as needed.   gabapentin (NEURONTIN) 300 MG capsule Take 1 capsule (300 mg total) by mouth at bedtime. [place]   Insulin Pen Needle (PEN NEEDLES 3/16") 31G X 5 MM MISC 1 each by Does not apply route daily.   liraglutide (VICTOZA) 18 MG/3ML SOPN INJECT 1.8MG  INTO THE SKIN ONCE DAILY   lisinopril (ZESTRIL) 20 MG tablet Take 1 tablet (20 mg total) by mouth daily.   lovastatin (MEVACOR) 40 MG tablet TAKE 1 TABLET BY MOUTH ONCE DAILY. OFFICE VISIT NEEDED FOR FURTHER REFILLS.   metFORMIN (GLUCOPHAGE) 1000 MG tablet Take 1 tablet (1,000 mg total) by mouth 2 (two) times daily.   pantoprazole (PROTONIX) 40 MG tablet Take 1 tablet (40 mg total) by mouth daily.   pioglitazone (ACTOS) 15 MG tablet TAKE 1 TABLET BY MOUTH ONCE DAILY. OFFICE VISIT NEEDED FOR FURTHER REFILLS.   Probiotic Product  (PROBIOTIC ADVANCED) CAPS Take 1 capsule by mouth daily.   sertraline (ZOLOFT) 100 MG tablet TAKE 1 TABLET BY MOUTH ONCE DAILY ALONG WITH 50MG  TABLET FOR TOTAL DAILY DOSE OF 150MG    sertraline (ZOLOFT) 50 MG tablet TAKE 1 TABLET BY MOUTH ONCE DAILY ALONG WITH 100MG  TABLET FOR TOTAL DAILY DOSE OF 150MG . APPOINTMENT NEEDED BEFORE 30DAYS ARE UP    PHQ 2/9 Scores 09/13/2020 06/22/2020 04/25/2020 08/23/2019  PHQ - 2 Score 1 1 0 0  PHQ- 9 Score 2 1 0 3    GAD 7 : Generalized Anxiety Score 09/13/2020 06/22/2020 04/25/2020 08/23/2019  Nervous, Anxious, on Edge 1 0 1 1  Control/stop worrying 1 2 1 1   Worry too much - different things 0 1 0 1  Trouble relaxing 0 0 0 0  Restless 0 0 0 0  Easily annoyed or irritable 0 0 0 1  Afraid - awful might happen 0 0 0 2  Total GAD 7 Score 2 3 2 6   Anxiety Difficulty - Somewhat difficult - Not difficult at all    BP Readings from Last 3  Encounters:  09/13/20 130/80  06/22/20 122/84  05/29/20 128/73    Physical Exam Vitals and nursing note reviewed.  Constitutional:      General: She is not in acute distress.    Appearance: She is not diaphoretic.  HENT:     Head: Normocephalic and atraumatic.     Right Ear: Tympanic membrane, ear canal and external ear normal.     Left Ear: Tympanic membrane, ear canal and external ear normal.     Nose: Nose normal. No congestion or rhinorrhea.  Eyes:     General:        Right eye: No discharge.        Left eye: No discharge.     Conjunctiva/sclera: Conjunctivae normal.     Pupils: Pupils are equal, round, and reactive to light.  Neck:     Thyroid: No thyromegaly.     Vascular: No JVD.  Cardiovascular:     Rate and Rhythm: Normal rate and regular rhythm.     Heart sounds: Normal heart sounds. No murmur heard.   No friction rub. No gallop.  Pulmonary:     Effort: Pulmonary effort is normal.     Breath sounds: Normal breath sounds. No wheezing, rhonchi or rales.  Abdominal:     General: Bowel sounds are normal.      Palpations: Abdomen is soft. There is no mass.     Tenderness: There is no abdominal tenderness. There is no guarding.  Musculoskeletal:        General: Normal range of motion.     Cervical back: Normal range of motion and neck supple.  Lymphadenopathy:     Cervical: No cervical adenopathy.  Skin:    General: Skin is warm and dry.  Neurological:     Mental Status: She is alert.     Deep Tendon Reflexes: Reflexes are normal and symmetric.    Wt Readings from Last 3 Encounters:  09/13/20 232 lb (105.2 kg)  06/22/20 239 lb (108.4 kg)  05/29/20 232 lb 6.4 oz (105.4 kg)    BP 130/80   Pulse 88   Ht 5\' 8"  (1.727 m)   Wt 232 lb (105.2 kg)   BMI 35.28 kg/m   Assessment and Plan:  1. Type 2 diabetes mellitus without complication, without long-term current use of insulin (HCC) Chronic.  Controlled.  Stable.  Patient is currently on regimen of Victoza metformin and Actos.  She will continue on present dosing pending results of A1c. - HgB A1c

## 2020-09-14 LAB — HEMOGLOBIN A1C
Est. average glucose Bld gHb Est-mCnc: 177 mg/dL
Hgb A1c MFr Bld: 7.8 % — ABNORMAL HIGH (ref 4.8–5.6)

## 2020-09-22 ENCOUNTER — Ambulatory Visit: Payer: BC Managed Care – PPO | Admitting: Podiatry

## 2020-09-26 ENCOUNTER — Other Ambulatory Visit: Payer: Self-pay

## 2020-09-26 ENCOUNTER — Ambulatory Visit: Payer: BC Managed Care – PPO | Admitting: Podiatry

## 2020-09-26 DIAGNOSIS — M7662 Achilles tendinitis, left leg: Secondary | ICD-10-CM | POA: Diagnosis not present

## 2020-09-26 DIAGNOSIS — M722 Plantar fascial fibromatosis: Secondary | ICD-10-CM

## 2020-09-26 MED ORDER — BETAMETHASONE SOD PHOS & ACET 6 (3-3) MG/ML IJ SUSP
3.0000 mg | Freq: Once | INTRAMUSCULAR | Status: AC
  Administered 2020-09-26: 3 mg via INTRA_ARTICULAR

## 2020-09-26 NOTE — Progress Notes (Signed)
   HPI: 60 y.o. female presenting today for follow-up evaluation and treatment of Achilles tendinitis to the left lower extremity as well as plantar fasciitis left.  Overall the patient states that she is feeling much better.  She continues to have some pain to the Achilles tendon area.  She is been wearing the cam boot intermittently throughout the day.  No new complaints at this time    Past Medical History:  Diagnosis Date   Concussion May 2016   Depression    Diabetes mellitus without complication ([place])    Family history of adverse reaction to anesthesia    Father - slow to wake   GERD (gastroesophageal reflux disease)    High cholesterol    Hypertension    Yeast infection       Physical Exam: General: The patient is alert and oriented x3 in no acute distress.  Dermatology: Skin is warm, dry and supple bilateral lower extremities. Negative for open lesions or macerations.  Vascular: Palpable pedal pulses bilaterally. No edema or erythema noted. Capillary refill within normal limits.  Neurological: Epicritic and protective threshold diminished bilaterally.   Musculoskeletal Exam: Pain on palpation noted to the posterior tubercle of the left calcaneus at the insertion of the Achilles tendon consistent with retrocalcaneal bursitis.  There is also pain on palpation along the plantar fascia as it inserts onto the calcaneus consistent with findings of a plantar fasciitis.  Range of motion within normal limits. Muscle strength 5/5 in all muscle groups bilateral lower extremities.  Radiographic Exam:  Posterior and plantar calcaneal spur noted to the respective calcaneus on lateral view. No fracture or dislocation noted. Normal osseous mineralization noted.     Assessment: 1. Insertional Achilles tendinitis left 2.  Plantar fasciitis left 3.  Diabetes mellitus with peripheral polyneuropathy   Plan of Care:  1. Patient was evaluated. Radiographs were reviewed today. 2. Injection  of 0.5 mL Celestone Soluspan injected into the retrocalcaneal bursa. Care was taken to avoid direct injection into the Achilles tendon. 3.  No injection provided to the plantar fascial left today. 4.  No Medrol Dosepak prescribed.  Patient states that it elevates her sugar levels 5.  Continue prescription for Celebrex 100 mg 2 times daily.  Patient states that she takes Celebrex for left knee pain.  #120 with 1 refill 6.  Continue gabapentin 300 mg nightly #90 with 1 refill 7.  Continue cam boot as needed  8.  Order placed for physical therapy at [facility].  Prescription provided for the patient  9.  Return to clinic in 6 weeks just before school starts  *Optometrist at [employer]    Edrick Kins, DPM [facility]  Dr. Edrick Kins, DPM    [address], [place] [address]                Office [phone]  Fax [phone]

## 2020-11-07 ENCOUNTER — Ambulatory Visit: Payer: BC Managed Care – PPO | Admitting: Podiatry

## 2020-11-07 ENCOUNTER — Other Ambulatory Visit: Payer: Self-pay | Admitting: Gastroenterology

## 2020-11-15 ENCOUNTER — Other Ambulatory Visit: Payer: Self-pay | Admitting: Family Medicine

## 2020-11-15 DIAGNOSIS — F3341 Major depressive disorder, recurrent, in partial remission: Secondary | ICD-10-CM

## 2020-11-15 DIAGNOSIS — I10 Essential (primary) hypertension: Secondary | ICD-10-CM

## 2020-11-15 DIAGNOSIS — E119 Type 2 diabetes mellitus without complications: Secondary | ICD-10-CM

## 2020-11-20 ENCOUNTER — Encounter: Payer: Self-pay | Admitting: Family Medicine

## 2020-11-21 ENCOUNTER — Other Ambulatory Visit: Payer: Self-pay

## 2020-11-21 ENCOUNTER — Ambulatory Visit: Payer: BC Managed Care – PPO | Admitting: Podiatry

## 2020-11-21 DIAGNOSIS — B379 Candidiasis, unspecified: Secondary | ICD-10-CM

## 2020-11-21 DIAGNOSIS — T3695XA Adverse effect of unspecified systemic antibiotic, initial encounter: Secondary | ICD-10-CM

## 2020-11-21 MED ORDER — FLUCONAZOLE 150 MG PO TABS: 150.0000 mg | ORAL_TABLET | Freq: Once | ORAL | 0 refills | Status: AC

## 2020-11-21 NOTE — Progress Notes (Signed)
Sent diflucan

## 2020-11-23 ENCOUNTER — Telehealth: Payer: Self-pay

## 2020-11-23 NOTE — Telephone Encounter (Signed)
Called pt and left VM asking her to call [phone] to schedule her mammogram for Dr. Ronnald Ramp.

## 2020-12-13 ENCOUNTER — Other Ambulatory Visit: Payer: Self-pay | Admitting: Gastroenterology

## 2020-12-18 ENCOUNTER — Telehealth: Payer: Self-pay | Admitting: Gastroenterology

## 2020-12-18 NOTE — Telephone Encounter (Signed)
Pantoprazole has been sent to pt's pharmacy per request.

## 2020-12-18 NOTE — Telephone Encounter (Signed)
pantoprazole (PROTONIX) 40 MG table patient requesting refill

## 2020-12-27 ENCOUNTER — Ambulatory Visit: Payer: Self-pay | Admitting: *Deleted

## 2020-12-27 NOTE — Telephone Encounter (Signed)
Reason for Disposition  [1] MILD dizziness (e.g., walking normally) AND [2] has NOT been evaluated by physician for this  (Exception: dizziness caused by heat exposure, sudden standing, or poor fluid intake)  Answer Assessment - Initial Assessment Questions 1. DESCRIPTION: "Describe your dizziness."     Pt calling in.    She is c/o dizziness and a roaring sound in both ears.  Sunday night started with a scratchy throat and coughing up mucus.   Now it's settled in my ears.   I had a tube put in in Jan. In right ear.  No drainage.   2. LIGHTHEADED: "Do you feel lightheaded?" (e.g., somewhat faint, woozy, weak upon standing)     When getting up to walk I feel dizzy.   I've been out of work since Monday.   I called my ENT dr on Monday and he wasn't in.   But started me on a Z Pak.   But I'm filling like it's in my ears.   I tried to see him today but he is not in today.  I'm not sure the Z Pak is helping. 3. VERTIGO: "Do you feel like either you or the room is spinning or tilting?" (i.e. vertigo)     No 4. SEVERITY: "How bad is it?"  "Do you feel like you are going to faint?" "Can you stand and walk?"   - MILD: Feels slightly dizzy, but walking normally.   - MODERATE: Feels unsteady when walking, but not falling; interferes with normal activities (e.g., school, work).   - SEVERE: Unable to walk without falling, or requires assistance to walk without falling; feels like passing out now.      I might feel a little dizzy while walking.   It just doesn't feel right.   My head is full. 5. ONSET:  "When did the dizziness begin?"     Sunday night   Home Covid test is negative. 6. AGGRAVATING FACTORS: "Does anything make it worse?" (e.g., standing, change in head position)     Walking around 7. HEART RATE: "Can you tell me your heart rate?" "How many beats in 15 seconds?"  (Note: not all patients can do this)       Not asked 8. CAUSE: "What do you think is causing the dizziness?"     My ears being  full 9. RECURRENT SYMPTOM: "Have you had dizziness before?" If Yes, ask: "When was the last time?" "What happened that time?"     Yes when sick 10. OTHER SYMPTOMS: "Do you have any other symptoms?" (e.g., fever, chest pain, vomiting, diarrhea, bleeding)       I have a little cough.   I had nasal congestion but now it's all in my ears. 11. PREGNANCY: "Is there any chance you are pregnant?" "When was your last menstrual period?"       Not asked  Protocols used: Dizziness - Lightheadedness-A-AH

## 2020-12-27 NOTE — Telephone Encounter (Signed)
Pt called in c/o a roaring sound in both ears, scratchy throat and a mild cough since Sunday evening.   Covid negative.   She feels like her ears are full.   "Like all the congestion is in my ears".  I made her an appt with Dr. Otilio Miu for 12/28/2020 at 11:00 in office visit.

## 2020-12-28 ENCOUNTER — Ambulatory Visit: Payer: BC Managed Care – PPO | Admitting: Family Medicine

## 2021-01-16 ENCOUNTER — Other Ambulatory Visit: Payer: Self-pay

## 2021-01-16 ENCOUNTER — Encounter: Payer: Self-pay | Admitting: Family Medicine

## 2021-01-16 ENCOUNTER — Ambulatory Visit: Payer: BC Managed Care – PPO | Admitting: Family Medicine

## 2021-01-16 VITALS — BP 120/70 | HR 80 | Ht 68.0 in | Wt 223.0 lb

## 2021-01-16 DIAGNOSIS — Z23 Encounter for immunization: Secondary | ICD-10-CM | POA: Diagnosis not present

## 2021-01-16 DIAGNOSIS — F3341 Major depressive disorder, recurrent, in partial remission: Secondary | ICD-10-CM | POA: Diagnosis not present

## 2021-01-16 DIAGNOSIS — E119 Type 2 diabetes mellitus without complications: Secondary | ICD-10-CM

## 2021-01-16 DIAGNOSIS — E782 Mixed hyperlipidemia: Secondary | ICD-10-CM

## 2021-01-16 DIAGNOSIS — I10 Essential (primary) hypertension: Secondary | ICD-10-CM | POA: Diagnosis not present

## 2021-01-16 DIAGNOSIS — M62838 Other muscle spasm: Secondary | ICD-10-CM

## 2021-01-16 MED ORDER — SERTRALINE HCL 50 MG PO TABS: ORAL_TABLET | ORAL | 1 refills | Status: DC

## 2021-01-16 MED ORDER — PIOGLITAZONE HCL 15 MG PO TABS: ORAL_TABLET | ORAL | 1 refills | Status: DC

## 2021-01-16 MED ORDER — SERTRALINE HCL 100 MG PO TABS: ORAL_TABLET | ORAL | 1 refills | Status: DC

## 2021-01-16 MED ORDER — VICTOZA 18 MG/3ML ~~LOC~~ SOPN: PEN_INJECTOR | SUBCUTANEOUS | 8 refills | Status: DC

## 2021-01-16 MED ORDER — LOVASTATIN 40 MG PO TABS: ORAL_TABLET | ORAL | 1 refills | Status: DC

## 2021-01-16 MED ORDER — LISINOPRIL 20 MG PO TABS: 20.0000 mg | ORAL_TABLET | Freq: Every day | ORAL | 1 refills | Status: DC

## 2021-01-16 MED ORDER — CYCLOBENZAPRINE HCL 5 MG PO TABS: 5.0000 mg | ORAL_TABLET | Freq: Every evening | ORAL | 5 refills | Status: DC | PRN

## 2021-01-16 MED ORDER — METFORMIN HCL 1000 MG PO TABS: 1000.0000 mg | ORAL_TABLET | Freq: Two times a day (BID) | ORAL | 1 refills | Status: DC

## 2021-01-16 NOTE — Progress Notes (Signed)
Date:  01/16/2021   Name:  Betty Walters   DOB:  06/11/1960   MRN:  [id number]   Chief Complaint: Diabetes (Foot exam), Depression, Hypertension, Hyperlipidemia, Spasms (Refill cyclobenzaprine), and Flu Vaccine  Diabetes She presents for her follow-up diabetic visit. She has type 2 diabetes mellitus. Her disease course has been stable. There are no hypoglycemic associated symptoms. Pertinent negatives for hypoglycemia include no dizziness, headaches, nervousness/anxiousness or sweats. There are no diabetic associated symptoms. Pertinent negatives for diabetes include no blurred vision, no chest pain, no fatigue and no polydipsia. There are no hypoglycemic complications. Symptoms are stable. There are no diabetic complications. Pertinent negatives for diabetic complications include no CVA, PVD or retinopathy. There are no known risk factors for coronary artery disease. Current diabetic treatment includes oral agent (dual therapy) (victoza). She is following a generally healthy diet. Meal planning includes avoidance of concentrated sweets and carbohydrate counting. She participates in exercise intermittently. Her home blood glucose trend is fluctuating minimally. An ACE inhibitor/angiotensin II receptor blocker is being taken.  Depression        This is a chronic problem.  The current episode started more than 1 year ago.   The problem occurs daily.  The problem has been gradually improving since onset.  Associated symptoms include no decreased concentration, no fatigue, no helplessness, no hopelessness, does not have insomnia, not irritable, no restlessness, no decreased interest, no appetite change, no body aches, no myalgias, no headaches, no indigestion, not sad and no suicidal ideas.     The symptoms are aggravated by nothing.  Past treatments include SSRIs - Selective serotonin reuptake inhibitors.  Compliance with treatment is good.  Previous treatment provided moderate relief.   Pertinent  negatives include no anxiety. Hypertension This is a chronic problem. The current episode started more than 1 year ago. The problem has been gradually improving since onset. The problem is controlled. Pertinent negatives include no anxiety, blurred vision, chest pain, headaches, malaise/fatigue, neck pain, orthopnea, palpitations, peripheral edema, PND, shortness of breath or sweats. There are no associated agents to hypertension. Risk factors for coronary artery disease include dyslipidemia. Past treatments include ACE inhibitors. There is no history of angina, kidney disease, CAD/MI, CVA, heart failure, left ventricular hypertrophy, PVD or retinopathy. There is no history of chronic renal disease, a hypertension causing med or renovascular disease.  Hyperlipidemia The problem is controlled. Recent lipid tests were reviewed and are normal. Exacerbating diseases include diabetes. She has no history of chronic renal disease. Pertinent negatives include no chest pain, focal sensory loss, focal weakness, leg pain, myalgias or shortness of breath. Current antihyperlipidemic treatment includes statins. The current treatment provides moderate improvement of lipids. There are no compliance problems.  Risk factors for coronary artery disease include dyslipidemia, diabetes mellitus and hypertension.   Lab Results  Component Value Date   CREATININE 0.90 04/25/2020   BUN 19 04/25/2020   NA 136 04/25/2020   K 4.5 04/25/2020   CL 100 04/25/2020   CO2 17 (L) 04/25/2020   Lab Results  Component Value Date   CHOL 229 (H) 04/25/2020   HDL 58 04/25/2020   LDLCALC 134 (H) 04/25/2020   TRIG 209 (H) 04/25/2020   CHOLHDL 4.6 (H) 08/05/2018   No results found for: TSH Lab Results  Component Value Date   HGBA1C 7.8 (H) 09/13/2020   Lab Results  Component Value Date   WBC 9.7 02/05/2017   HGB 13.1 02/05/2017   HCT 38.9 02/05/2017   MCV  86.1 02/05/2017   PLT 301 02/05/2017   Lab Results  Component Value  Date   ALT 18 03/24/2019   AST 12 03/24/2019   ALKPHOS 117 03/24/2019   BILITOT 0.3 03/24/2019     Review of Systems  Constitutional:  Negative for appetite change, chills, fatigue, fever and malaise/fatigue.  HENT:  Negative for drooling, ear discharge, ear pain and sore throat.   Eyes:  Negative for blurred vision.  Respiratory:  Negative for cough, shortness of breath and wheezing.   Cardiovascular:  Negative for chest pain, palpitations, orthopnea, leg swelling and PND.  Gastrointestinal:  Negative for abdominal pain, blood in stool, constipation, diarrhea and nausea.  Endocrine: Negative for polydipsia.  Genitourinary:  Negative for dysuria, frequency, hematuria and urgency.  Musculoskeletal:  Negative for back pain, myalgias and neck pain.  Skin:  Negative for rash.  Allergic/Immunologic: Negative for environmental allergies.  Neurological:  Negative for dizziness, focal weakness and headaches.  Hematological:  Does not bruise/bleed easily.  Psychiatric/Behavioral:  Positive for depression. Negative for decreased concentration and suicidal ideas. The patient is not nervous/anxious and does not have insomnia.    Patient Active Problem List   Diagnosis Date Noted   Sprain, metatarsophalangeal joint 01/26/2018   Heartburn    Dyspepsia    Change in bowel habits    Benign neoplasm of descending colon    Chronic pain of toes of both feet 09/20/2016   Type 2 diabetes mellitus with complication ([place]) 10/93/2355   Allergic rhinitis due to pollen 06/21/2015   Concussion with coma 10/21/2014   Clinical depression 09/28/2014   Diabetes mellitus, type 2 ([place]) 09/28/2014   HLD (hyperlipidemia) 09/28/2014   Essential hypertension 09/28/2014   Adiposity 09/28/2014    Allergies  Allergen Reactions   Amoxicillin-Pot Clavulanate Nausea And Vomiting    (OK with Amoxicillin alone)    Past Surgical History:  Procedure Laterality Date   CESAREAN SECTION     COLONOSCOPY WITH PROPOFOL  N/A 04/11/2017   Procedure: COLONOSCOPY WITH colon;  Surgeon: Lucilla Lame, MD;  Location: [facility];  Service: Endoscopy;  Laterality: N/A;   ESOPHAGOGASTRODUODENOSCOPY (EGD) WITH PROPOFOL N/A 04/11/2017   Procedure: ESOPHAGOGASTRODUODENOSCOPY (EGD) WITH PROPOFOL;  Surgeon: Lucilla Lame, MD;  Location: [facility];  Service: Endoscopy;  Laterality: N/A;  Diabetic - oral meds   GALLBLADDER SURGERY     KNEE SURGERY     MYRINGOTOMY WITH TUBE PLACEMENT Right 03/22/2020   Procedure: MYRINGOTOMY WITH TUBE PLACEMENT;  Surgeon: Carloyn Manner, MD;  Location: [facility];  Service: ENT;  Laterality: Right;  Diabetic   POLYPECTOMY  04/11/2017   Procedure: POLYPECTOMY INTESTINAL;  Surgeon: Lucilla Lame, MD;  Location: [facility];  Service: Endoscopy;;   WRIST SURGERY      Social History   Tobacco Use   Smoking status: Never   Smokeless tobacco: Never  Vaping Use   Vaping Use: Never used  Substance Use Topics   Alcohol use: Yes    Alcohol/week: 0.0 standard drinks    Comment: rare - Holidays   Drug use: No     Medication list has been reviewed and updated.  Current Meds  Medication Sig   celecoxib (CELEBREX) 200 MG capsule Take 1 capsule (200 mg total) by mouth 2 (two) times daily. ortho   cyclobenzaprine (FLEXERIL) 5 MG tablet Take 5 mg by mouth at bedtime as needed.   dicyclomine (BENTYL) 20 MG tablet TAKE 1 TABLET BY MOUTH THREE TIMES DAILY BEFORE MEAL(S)  gabapentin (NEURONTIN) 300 MG capsule Take 1 capsule (300 mg total) by mouth at bedtime. Weiner   Insulin Pen Needle (PEN NEEDLES 3/16") 31G X 5 MM MISC 1 each by Does not apply route daily.   liraglutide (VICTOZA) 18 MG/3ML SOPN INJECT 1.8MG  INTO THE SKIN ONCE DAILY   lisinopril (ZESTRIL) 20 MG tablet Take 1 tablet by mouth once daily   lovastatin (MEVACOR) 40 MG tablet TAKE 1 TABLET BY MOUTH ONCE DAILY. OFFICE VISIT NEEDED FOR FURTHER REFILLS.   metFORMIN (GLUCOPHAGE) 1000 MG tablet Take 1  tablet (1,000 mg total) by mouth 2 (two) times daily.   pantoprazole (PROTONIX) 40 MG tablet Take 1 tablet by mouth once daily   pioglitazone (ACTOS) 15 MG tablet TAKE 1 TABLET BY MOUTH ONCE DAILY. OFFICE VISIT NEEDED FOR FURTHER REFILLS   sertraline (ZOLOFT) 100 MG tablet TAKE 1 TABLET BY MOUTH ONCE DAILY ALONG WITH 50 MG TABLET FOR A TOTAL OF 150MG .   sertraline (ZOLOFT) 50 MG tablet TAKE 1 TABLET BY MOUTH ONCE DAILY ALONG WITH 100MG  TABLET FOR TOTAL DAILY DOSE OF 150MG . APPOINTMENT NEEDED BEFORE 30DAYS ARE UP   [DISCONTINUED] Probiotic Product (PROBIOTIC ADVANCED) CAPS Take 1 capsule by mouth daily.    PHQ 2/9 Scores 01/16/2021 09/13/2020 06/22/2020 04/25/2020  PHQ - 2 Score 0 1 1 0  PHQ- 9 Score 0 2 1 0    GAD 7 : Generalized Anxiety Score 01/16/2021 09/13/2020 06/22/2020 04/25/2020  Nervous, Anxious, on Edge 0 1 0 1  Control/stop worrying 0 1 2 1   Worry too much - different things 0 0 1 0  Trouble relaxing 0 0 0 0  Restless 0 0 0 0  Easily annoyed or irritable 0 0 0 0  Afraid - awful might happen 0 0 0 0  Total GAD 7 Score 0 2 3 2   Anxiety Difficulty - - Somewhat difficult -    BP Readings from Last 3 Encounters:  01/16/21 120/70  09/13/20 130/80  06/22/20 122/84    Physical Exam Vitals and nursing note reviewed.  Constitutional:      General: She is not irritable.She is not in acute distress.    Appearance: She is not diaphoretic.  HENT:     Head: Normocephalic and atraumatic.     Right Ear: Tympanic membrane, ear canal and external ear normal.     Left Ear: Tympanic membrane and external ear normal.     Nose: Nose normal.  Eyes:     General:        Right eye: No discharge.        Left eye: No discharge.     Conjunctiva/sclera: Conjunctivae normal.     Pupils: Pupils are equal, round, and reactive to light.  Neck:     Thyroid: No thyromegaly.     Vascular: No JVD.  Cardiovascular:     Rate and Rhythm: Normal rate and regular rhythm.     Heart sounds: Normal heart  sounds, S1 normal and S2 normal. No murmur heard. No systolic murmur is present.  No diastolic murmur is present.    No friction rub. No gallop. No S3 or S4 sounds.  Pulmonary:     Effort: Pulmonary effort is normal.     Breath sounds: Normal breath sounds.  Abdominal:     General: Bowel sounds are normal.     Palpations: Abdomen is soft. There is no mass.     Tenderness: There is no abdominal tenderness. There is no guarding.  Musculoskeletal:  General: Normal range of motion.     Cervical back: Normal range of motion and neck supple.  Lymphadenopathy:     Cervical: No cervical adenopathy.  Skin:    General: Skin is warm and dry.  Neurological:     Mental Status: She is alert.     Deep Tendon Reflexes: Reflexes are normal and symmetric.    Wt Readings from Last 3 Encounters:  01/16/21 223 lb (101.2 kg)  09/13/20 232 lb (105.2 kg)  06/22/20 239 lb (108.4 kg)    BP 120/70   Pulse 80   Ht 5\' 8"  (1.727 m)   Wt 223 lb (101.2 kg)   BMI 33.91 kg/m   Assessment and Plan:  1. Type 2 diabetes mellitus without complication, without long-term current use of insulin (HCC) Chronic.  Controlled.  Stable.  A1c has been appropriate in the past.  Continue Actos 15 mg once a day and metformin 1 g twice a day and Victoza 1.8 mg daily.  We will recheck A1c.  Foot exam is unremarkable. - liraglutide (VICTOZA) 18 MG/3ML SOPN; INJECT 1.8MG  INTO THE SKIN ONCE DAILY  Dispense: 9 mL; Refill: 8 - metFORMIN (GLUCOPHAGE) 1000 MG tablet; Take 1 tablet (1,000 mg total) by mouth 2 (two) times daily.  Dispense: 180 tablet; Refill: 1 - pioglitazone (ACTOS) 15 MG tablet; Take 1 tablet daily  Dispense: 90 tablet; Refill: 1 - HgB A1c  2. Essential hypertension Chronic.  Controlled.  Stable.  Blood pressure 120/70.  Continue lisinopril 20 mg once a day.  Will check CMP. - lisinopril (ZESTRIL) 20 MG tablet; Take 1 tablet (20 mg total) by mouth daily.  Dispense: 90 tablet; Refill: 1 - Comprehensive  Metabolic Panel (CMET)  3. Mixed hyperlipidemia Chronic.  Controlled.  Stable.  Continue lovastatin 40 mg once a day.  Will check lipid panel and CMP. - lovastatin (MEVACOR) 40 MG tablet; TAKE 1 TABLET BY MOUTH ONCE DAILY. OFFICE VISIT NEEDED FOR FURTHER REFILLS.  Dispense: 90 tablet; Refill: 1 - Lipid Panel With LDL/HDL Ratio - Comprehensive Metabolic Panel (CMET)  4. Recurrent major depressive disorder, in partial remission (HCC) Chronic.  Controlled.  Stable.  Continue sertraline 150 mg once a day. - sertraline (ZOLOFT) 100 MG tablet; TAKE 1 TABLET BY MOUTH ONCE DAILY ALONG WITH 50 MG TABLET FOR A TOTAL OF 150MG .  Dispense: 90 tablet; Refill: 1 - sertraline (ZOLOFT) 50 MG tablet; TAKE 1 TABLET BY MOUTH ONCE DAILY ALONG WITH 100MG  TABLET FOR TOTAL DAILY DOSE OF 150MG . APPOINTMENT NEEDED BEFORE 30DAYS ARE UP  Dispense: 90 tablet; Refill: 1  5. Muscle spasm Chronic.  Controlled.  Stable.  Patient is to continue cyclobenzaprine 5 mg nightly as needed for muscle relaxation. - cyclobenzaprine (FLEXERIL) 5 MG tablet; Take 1 tablet (5 mg total) by mouth at bedtime as needed.  Dispense: 30 tablet; Refill: 5  6. Need for immunization against influenza Discussed and administered. - Flu Vaccine QUAD 46mo+IM (Fluarix, Fluzone & Alfiuria Quad PF)

## 2021-01-17 LAB — COMPREHENSIVE METABOLIC PANEL
ALT: 11 IU/L (ref 0–32)
AST: 12 IU/L (ref 0–40)
Albumin/Globulin Ratio: 2 (ref 1.2–2.2)
Albumin: 4.6 g/dL (ref 3.8–4.9)
Alkaline Phosphatase: 91 IU/L (ref 44–121)
BUN/Creatinine Ratio: 30 — ABNORMAL HIGH (ref 9–23)
BUN: 28 mg/dL — ABNORMAL HIGH (ref 6–24)
Bilirubin Total: 0.2 mg/dL (ref 0.0–1.2)
CO2: 20 mmol/L (ref 20–29)
Calcium: 10.2 mg/dL (ref 8.7–10.2)
Chloride: 104 mmol/L (ref 96–106)
Creatinine, Ser: 0.94 mg/dL (ref 0.57–1.00)
Globulin, Total: 2.3 g/dL (ref 1.5–4.5)
Glucose: 117 mg/dL — ABNORMAL HIGH (ref 70–99)
Potassium: 5.1 mmol/L (ref 3.5–5.2)
Sodium: 137 mmol/L (ref 134–144)
Total Protein: 6.9 g/dL (ref 6.0–8.5)
eGFR: 70 mL/min/{1.73_m2} (ref >59)

## 2021-01-17 LAB — HEMOGLOBIN A1C
Est. average glucose Bld gHb Est-mCnc: 146 mg/dL
Hgb A1c MFr Bld: 6.7 % — ABNORMAL HIGH (ref 4.8–5.6)

## 2021-01-17 LAB — LIPID PANEL WITH LDL/HDL RATIO
Cholesterol, Total: 202 mg/dL — ABNORMAL HIGH (ref 100–199)
HDL: 59 mg/dL (ref >39)
LDL Chol Calc (NIH): 113 mg/dL — ABNORMAL HIGH (ref 0–99)
LDL/HDL Ratio: 1.9 ratio (ref 0.0–3.2)
Triglycerides: 174 mg/dL — ABNORMAL HIGH (ref 0–149)
VLDL Cholesterol Cal: 30 mg/dL (ref 5–40)

## 2021-01-30 ENCOUNTER — Ambulatory Visit
Admission: RE | Admit: 2021-01-30 | Discharge: 2021-01-30 | Disposition: A | Discharge status: Home / Self Care | Payer: BC Managed Care – PPO | Attending: Family Medicine | Admitting: Family Medicine

## 2021-01-30 ENCOUNTER — Other Ambulatory Visit: Payer: Self-pay

## 2021-01-30 ENCOUNTER — Ambulatory Visit: Admission: RE | Admit: 2021-01-30 | Payer: BC Managed Care – PPO | Source: Ambulatory Visit

## 2021-01-30 ENCOUNTER — Ambulatory Visit: Payer: BC Managed Care – PPO | Admitting: Family Medicine

## 2021-01-30 ENCOUNTER — Encounter: Payer: Self-pay | Admitting: Family Medicine

## 2021-01-30 ENCOUNTER — Ambulatory Visit
Admission: RE | Admit: 2021-01-30 | Discharge: 2021-01-30 | Disposition: A | Discharge status: Home / Self Care | Payer: BC Managed Care – PPO | Source: Ambulatory Visit | Attending: Family Medicine | Admitting: Family Medicine

## 2021-01-30 VITALS — BP 120/68 | HR 88 | Temp 98.3°F | Ht 68.0 in | Wt 230.0 lb

## 2021-01-30 DIAGNOSIS — R0689 Other abnormalities of breathing: Secondary | ICD-10-CM | POA: Insufficient documentation

## 2021-01-30 DIAGNOSIS — R051 Acute cough: Secondary | ICD-10-CM | POA: Diagnosis not present

## 2021-01-30 LAB — POCT INFLUENZA A/B
Influenza A, POC: NEGATIVE
Influenza B, POC: NEGATIVE

## 2021-01-30 MED ORDER — AZITHROMYCIN 250 MG PO TABS: ORAL_TABLET | ORAL | 0 refills | Status: AC

## 2021-01-30 MED ORDER — GUAIFENESIN-CODEINE 100-10 MG/5ML PO SYRP: 5.0000 mL | ORAL_SOLUTION | Freq: Three times a day (TID) | ORAL | 0 refills | Status: DC | PRN

## 2021-01-30 NOTE — Progress Notes (Signed)
Date:  01/30/2021   Name:  Betty Walters   DOB:  03-14-1961   MRN:  [id number]   Chief Complaint: Sinusitis (Cough and production, SOB when walking)  Sinusitis This is a new problem. The current episode started in the past 7 days. The problem has been gradually worsening since onset. There has been no fever. The pain is mild. Associated symptoms include congestion, coughing, a hoarse voice, shortness of breath, sinus pressure and a sore throat. Pertinent negatives include no chills, ear pain, headaches, neck pain or swollen glands. Treatments tried: otc.   Lab Results  Component Value Date   CREATININE 0.94 01/16/2021   BUN 28 (H) 01/16/2021   NA 137 01/16/2021   K 5.1 01/16/2021   CL 104 01/16/2021   CO2 20 01/16/2021   Lab Results  Component Value Date   CHOL 202 (H) 01/16/2021   HDL 59 01/16/2021   LDLCALC 113 (H) 01/16/2021   TRIG 174 (H) 01/16/2021   CHOLHDL 4.6 (H) 08/05/2018   No results found for: TSH Lab Results  Component Value Date   HGBA1C 6.7 (H) 01/16/2021   Lab Results  Component Value Date   WBC 9.7 02/05/2017   HGB 13.1 02/05/2017   HCT 38.9 02/05/2017   MCV 86.1 02/05/2017   PLT 301 02/05/2017   Lab Results  Component Value Date   ALT 11 01/16/2021   AST 12 01/16/2021   ALKPHOS 91 01/16/2021   BILITOT 0.2 01/16/2021     Review of Systems  Constitutional:  Negative for chills and fever.  HENT:  Positive for congestion, hoarse voice, sinus pressure and sore throat. Negative for drooling, ear discharge and ear pain.   Respiratory:  Positive for cough and shortness of breath. Negative for wheezing.   Cardiovascular:  Negative for chest pain, palpitations and leg swelling.  Gastrointestinal:  Negative for abdominal pain, blood in stool, constipation, diarrhea and nausea.  Endocrine: Negative for polydipsia.  Genitourinary:  Negative for dysuria, frequency, hematuria and urgency.  Musculoskeletal:  Negative for back pain, myalgias and neck  pain.  Skin:  Negative for rash.  Allergic/Immunologic: Negative for environmental allergies.  Neurological:  Negative for dizziness and headaches.  Hematological:  Does not bruise/bleed easily.  Psychiatric/Behavioral:  Negative for suicidal ideas. The patient is not nervous/anxious.    Patient Active Problem List   Diagnosis Date Noted   Sprain, metatarsophalangeal joint 01/26/2018   Heartburn    Dyspepsia    Change in bowel habits    Benign neoplasm of descending colon    Chronic pain of toes of both feet 09/20/2016   Type 2 diabetes mellitus with complication ([place]) 59/56/3875   Allergic rhinitis due to pollen 06/21/2015   Concussion with coma 10/21/2014   Clinical depression 09/28/2014   Diabetes mellitus, type 2 ([place]) 09/28/2014   HLD (hyperlipidemia) 09/28/2014   Essential hypertension 09/28/2014   Adiposity 09/28/2014    Allergies  Allergen Reactions   Amoxicillin-Pot Clavulanate Nausea And Vomiting    (OK with Amoxicillin alone)    Past Surgical History:  Procedure Laterality Date   CESAREAN SECTION     COLONOSCOPY WITH PROPOFOL N/A 04/11/2017   Procedure: COLONOSCOPY WITH colon;  Surgeon: Lucilla Lame, MD;  Location: [facility];  Service: Endoscopy;  Laterality: N/A;   ESOPHAGOGASTRODUODENOSCOPY (EGD) WITH PROPOFOL N/A 04/11/2017   Procedure: ESOPHAGOGASTRODUODENOSCOPY (EGD) WITH PROPOFOL;  Surgeon: Lucilla Lame, MD;  Location: [facility];  Service: Endoscopy;  Laterality: N/A;  Diabetic - oral  meds   GALLBLADDER SURGERY     KNEE SURGERY     MYRINGOTOMY WITH TUBE PLACEMENT Right 03/22/2020   Procedure: MYRINGOTOMY WITH TUBE PLACEMENT;  Surgeon: Carloyn Manner, MD;  Location: [facility];  Service: ENT;  Laterality: Right;  Diabetic   POLYPECTOMY  04/11/2017   Procedure: POLYPECTOMY INTESTINAL;  Surgeon: Lucilla Lame, MD;  Location: [facility];  Service: Endoscopy;;   WRIST SURGERY      Social History   Tobacco Use   Smoking  status: Never   Smokeless tobacco: Never  Vaping Use   Vaping Use: Never used  Substance Use Topics   Alcohol use: Yes    Alcohol/week: 0.0 standard drinks    Comment: rare - Holidays   Drug use: No     Medication list has been reviewed and updated.  Current Meds  Medication Sig   celecoxib (CELEBREX) 200 MG capsule Take 1 capsule (200 mg total) by mouth 2 (two) times daily. ortho   cyclobenzaprine (FLEXERIL) 5 MG tablet Take 1 tablet (5 mg total) by mouth at bedtime as needed.   dicyclomine (BENTYL) 20 MG tablet TAKE 1 TABLET BY MOUTH THREE TIMES DAILY BEFORE MEAL(S)   gabapentin (NEURONTIN) 300 MG capsule Take 1 capsule (300 mg total) by mouth at bedtime. Weiner   Insulin Pen Needle (PEN NEEDLES 3/16") 31G X 5 MM MISC 1 each by Does not apply route daily.   liraglutide (VICTOZA) 18 MG/3ML SOPN INJECT 1.8MG  INTO THE SKIN ONCE DAILY   lisinopril (ZESTRIL) 20 MG tablet Take 1 tablet (20 mg total) by mouth daily.   lovastatin (MEVACOR) 40 MG tablet TAKE 1 TABLET BY MOUTH ONCE DAILY. OFFICE VISIT NEEDED FOR FURTHER REFILLS.   metFORMIN (GLUCOPHAGE) 1000 MG tablet Take 1 tablet (1,000 mg total) by mouth 2 (two) times daily.   pantoprazole (PROTONIX) 40 MG tablet Take 1 tablet by mouth once daily   pioglitazone (ACTOS) 15 MG tablet Take 1 tablet daily   sertraline (ZOLOFT) 100 MG tablet TAKE 1 TABLET BY MOUTH ONCE DAILY ALONG WITH 50 MG TABLET FOR A TOTAL OF 150MG .   sertraline (ZOLOFT) 50 MG tablet TAKE 1 TABLET BY MOUTH ONCE DAILY ALONG WITH 100MG  TABLET FOR TOTAL DAILY DOSE OF 150MG . APPOINTMENT NEEDED BEFORE 30DAYS ARE UP    PHQ 2/9 Scores 01/16/2021 09/13/2020 06/22/2020 04/25/2020  PHQ - 2 Score 0 1 1 0  PHQ- 9 Score 0 2 1 0    GAD 7 : Generalized Anxiety Score 01/16/2021 09/13/2020 06/22/2020 04/25/2020  Nervous, Anxious, on Edge 0 1 0 1  Control/stop worrying 0 1 2 1   Worry too much - different things 0 0 1 0  Trouble relaxing 0 0 0 0  Restless 0 0 0 0  Easily annoyed or  irritable 0 0 0 0  Afraid - awful might happen 0 0 0 0  Total GAD 7 Score 0 2 3 2   Anxiety Difficulty - - Somewhat difficult -    BP Readings from Last 3 Encounters:  01/30/21 120/68  01/16/21 120/70  09/13/20 130/80    Physical Exam Vitals and nursing note reviewed.  Constitutional:      General: She is not in acute distress.    Appearance: She is not diaphoretic.  HENT:     Head: Normocephalic and atraumatic.     Right Ear: Tympanic membrane, ear canal and external ear normal.     Left Ear: Tympanic membrane, ear canal and external ear normal.  Nose: Nose normal.     Right Turbinates: Swollen.     Left Turbinates: Swollen.     Right Sinus: No maxillary sinus tenderness or frontal sinus tenderness.     Left Sinus: No maxillary sinus tenderness or frontal sinus tenderness.     Mouth/Throat:     Mouth: Mucous membranes are moist.  Eyes:     General:        Right eye: No discharge.        Left eye: No discharge.     Conjunctiva/sclera: Conjunctivae normal.     Pupils: Pupils are equal, round, and reactive to light.  Neck:     Thyroid: No thyromegaly.     Vascular: No JVD.  Cardiovascular:     Rate and Rhythm: Normal rate and regular rhythm.     Heart sounds: Normal heart sounds. No murmur heard.   No friction rub. No gallop.  Pulmonary:     Effort: Pulmonary effort is normal.     Breath sounds: Examination of the right-middle field reveals decreased breath sounds. Examination of the right-lower field reveals decreased breath sounds. Decreased breath sounds present. No wheezing, rhonchi or rales.  Abdominal:     General: Bowel sounds are normal.     Palpations: Abdomen is soft. There is no mass.     Tenderness: There is no abdominal tenderness. There is no guarding.  Musculoskeletal:        General: Normal range of motion.     Cervical back: Normal range of motion and neck supple.  Lymphadenopathy:     Cervical: No cervical adenopathy.  Skin:    General: Skin is  warm and dry.  Neurological:     Mental Status: She is alert.     Deep Tendon Reflexes: Reflexes are normal and symmetric.    Wt Readings from Last 3 Encounters:  01/30/21 230 lb (104.3 kg)  01/16/21 223 lb (101.2 kg)  09/13/20 232 lb (105.2 kg)    BP 120/68   Pulse 88   Temp 98.3 F (36.8 C) (Oral)   Ht 5\' 8"  (1.727 m)   Wt 230 lb (104.3 kg)   SpO2 99%   BMI 34.97 kg/m   Assessment and Plan:  1. Acute cough New onset.  Persistent.  Not controlled with over-the-counter medication.  COVID and influenza were noted to be negative.  We will treat with Robitussin-AC for at least relief at night.  And further evaluate with chest x-ray. - POCT Influenza A/B - DG Chest 2 View - guaiFENesin-codeine (ROBITUSSIN AC) 100-10 MG/5ML syrup; Take 5 mLs by mouth 3 (three) times daily as needed for cough.  Dispense: 118 mL; Refill: 0  2. Decreased breath sounds at right lung base New new onset.  Persistent.  Nonproductive cough.  Decreased breath sounds noted in the right base and right middle lobe.  This may be consistent with a community-acquired pneumonia we will obtain a chest x-ray for evaluation.  In the meantime we will initiate azithromycin to 50 mg 2 today followed by 1 a day for 4 days. - DG Chest 2 View

## 2021-03-06 ENCOUNTER — Ambulatory Visit: Payer: Self-pay

## 2021-03-06 NOTE — Telephone Encounter (Signed)
Pt called back. She states that she has been tx her symptoms of head congestion and sinus pressure for about a week now with Sudafed and Tylenol and its not getting any better. She reports sinus pain around her eyes and nasal area. States that her drainage is yellowish green color and she is had sinus infections in the past and know she needs an abx. Pt is asking if abx can be called into pharmacy without doing visit. Pt advised with symptoms most providers want to see pt either in office or virtually. Scheduled pt for appt 03/07/21 at 1140. Pt advised if medication is called into pharmacy today, she will be notified so appt can be cancelled. Care advice given and pt verbalized understanding. No other questions/concerns noted.     Message from [place] sent at 03/06/2021 10:32 AM EST  Patient called in says has sinus headache and pressure, wants to have something called in to pharmacy, [facility] ([facility]), [place]  Phone: [phone]  Fax: [phone]    Reason for Disposition  [1] Sinus congestion (pressure, fullness) AND [2] present > 10 days  Answer Assessment - Initial Assessment Questions 1. LOCATION: "Where does it hurt?"      Eyes and nose  2. ONSET: "When did the sinus pain start?"  (e.g., hours, days)      About a week  3. SEVERITY: "How bad is the pain?"   (Scale 1-10; mild, moderate or severe)   - MILD (1-3): doesn't interfere with normal activities    - MODERATE (4-7): interferes with normal activities (e.g., work or school) or awakens from sleep   - SEVERE (8-10): excruciating pain and patient unable to do any normal activities        Mild to moderate  4. RECURRENT SYMPTOM: "Have you ever had sinus problems before?" If Yes, ask: "When was the last time?" and "What happened that time?"      Yes,  5. NASAL CONGESTION: "Is the nose blocked?" If Yes, ask: "Can you open it or must you breathe through your mouth?"      congestion 6. NASAL DISCHARGE: "Do you have discharge from your nose?" If so ask, "What color?"     Yellowish green 7. FEVER: "Do you have a fever?" If Yes, ask: "What is it, how was it measured, and when did it start?"      No 8. OTHER SYMPTOMS: "Do you have any other symptoms?" (e.g., sore throat, cough, earache, difficulty breathing)     Headache and pressure 9. PREGNANCY: "Is there any chance you are pregnant?" "When was your last menstrual period?"     No  Protocols used: Sinus Pain or Congestion-A-AH

## 2021-03-07 ENCOUNTER — Telehealth: Payer: BC Managed Care – PPO | Admitting: Family Medicine

## 2021-04-20 ENCOUNTER — Telehealth: Payer: Self-pay

## 2021-04-20 ENCOUNTER — Other Ambulatory Visit: Payer: Self-pay | Admitting: Podiatry

## 2021-04-20 NOTE — Telephone Encounter (Signed)
Called pt and asked about getting her mammo set up. Pt stated her dad has been in hospital for 2 weeks and she has missed a lot of work. She will let us know when she is ready to schedule it.

## 2021-04-27 ENCOUNTER — Other Ambulatory Visit: Payer: Self-pay | Admitting: Family Medicine

## 2021-04-27 DIAGNOSIS — E119 Type 2 diabetes mellitus without complications: Secondary | ICD-10-CM

## 2021-05-05 ENCOUNTER — Other Ambulatory Visit: Payer: Self-pay | Admitting: Gastroenterology

## 2021-05-21 ENCOUNTER — Encounter: Payer: Self-pay | Admitting: Family Medicine

## 2021-05-21 ENCOUNTER — Ambulatory Visit (INDEPENDENT_AMBULATORY_CARE_PROVIDER_SITE_OTHER): Payer: BC Managed Care – PPO | Admitting: Family Medicine

## 2021-05-21 ENCOUNTER — Other Ambulatory Visit: Payer: Self-pay

## 2021-05-21 VITALS — BP 130/80 | HR 72 | Ht 68.0 in | Wt 234.0 lb

## 2021-05-21 DIAGNOSIS — E119 Type 2 diabetes mellitus without complications: Secondary | ICD-10-CM

## 2021-05-21 DIAGNOSIS — J301 Allergic rhinitis due to pollen: Secondary | ICD-10-CM

## 2021-05-21 MED ORDER — METFORMIN HCL 1000 MG PO TABS: 1000.0000 mg | ORAL_TABLET | Freq: Two times a day (BID) | ORAL | 1 refills | Status: DC

## 2021-05-21 MED ORDER — TRIAMCINOLONE ACETONIDE 55 MCG/ACT NA AERO: 2.0000 | INHALATION_SPRAY | Freq: Every day | NASAL | 12 refills | Status: DC

## 2021-05-21 MED ORDER — PIOGLITAZONE HCL 15 MG PO TABS: ORAL_TABLET | ORAL | 1 refills | Status: DC

## 2021-05-21 MED ORDER — MONTELUKAST SODIUM 10 MG PO TABS: 10.0000 mg | ORAL_TABLET | Freq: Every day | ORAL | 3 refills | Status: DC

## 2021-05-21 MED ORDER — VICTOZA 18 MG/3ML ~~LOC~~ SOPN: PEN_INJECTOR | SUBCUTANEOUS | 0 refills | Status: DC

## 2021-05-21 NOTE — Progress Notes (Signed)
Date:  05/21/2021   Name:  Betty Walters   DOB:  02/13/1961   MRN:  [id number]   Chief Complaint: Diabetes and Ear Pain (R) ear hurting and sinus pressure)  Diabetes She presents for her follow-up diabetic visit. She has type 2 diabetes mellitus. Her disease course has been stable. There are no hypoglycemic associated symptoms. Pertinent negatives for hypoglycemia include no dizziness, headaches or nervousness/anxiousness. Pertinent negatives for diabetes include no blurred vision, no chest pain, no fatigue, no foot paresthesias, no foot ulcerations, no polydipsia, no polyphagia, no polyuria, no visual change, no weakness and no weight loss. There are no hypoglycemic complications. Symptoms are stable. There are no diabetic complications. Risk factors for coronary artery disease include hypertension and dyslipidemia. Current diabetic treatment includes oral agent (dual therapy) (and victoza). She is following a generally healthy diet. Meal planning includes avoidance of concentrated sweets and carbohydrate counting.  Sinus Problem This is a new problem. The current episode started in the past 7 days. The problem has been waxing and waning since onset. There has been no fever. The pain is moderate. Associated symptoms include congestion, ear pain and sinus pressure. Pertinent negatives include no chills, coughing, diaphoresis, headaches, hoarse voice, neck pain, shortness of breath, sneezing, sore throat or swollen glands. Treatments tried: singulair. The treatment provided moderate relief.   Lab Results  Component Value Date   NA 137 01/16/2021   K 5.1 01/16/2021   CO2 20 01/16/2021   GLUCOSE 117 (H) 01/16/2021   BUN 28 (H) 01/16/2021   CREATININE 0.94 01/16/2021   CALCIUM 10.2 01/16/2021   EGFR 70 01/16/2021   GFRNONAA 71 04/25/2020   Lab Results  Component Value Date   CHOL 202 (H) 01/16/2021   HDL 59 01/16/2021   LDLCALC 113 (H) 01/16/2021   TRIG 174 (H) 01/16/2021   CHOLHDL 4.6  (H) 08/05/2018   No results found for: TSH Lab Results  Component Value Date   HGBA1C 6.7 (H) 01/16/2021   Lab Results  Component Value Date   WBC 9.7 02/05/2017   HGB 13.1 02/05/2017   HCT 38.9 02/05/2017   MCV 86.1 02/05/2017   PLT 301 02/05/2017   Lab Results  Component Value Date   ALT 11 01/16/2021   AST 12 01/16/2021   ALKPHOS 91 01/16/2021   BILITOT 0.2 01/16/2021   No results found for: 25OHVITD2, 25OHVITD3, VD25OH   Review of Systems  Constitutional:  Negative for chills, diaphoresis, fatigue, fever and weight loss.  HENT:  Positive for congestion, ear pain and sinus pressure. Negative for drooling, ear discharge, hoarse voice, sneezing and sore throat.   Eyes:  Negative for blurred vision.  Respiratory:  Negative for cough, shortness of breath and wheezing.   Cardiovascular:  Negative for chest pain, palpitations and leg swelling.  Gastrointestinal:  Negative for abdominal pain, blood in stool, constipation, diarrhea and nausea.  Endocrine: Negative for polydipsia, polyphagia and polyuria.  Genitourinary:  Negative for dysuria, frequency, hematuria and urgency.  Musculoskeletal:  Negative for back pain, myalgias and neck pain.  Skin:  Negative for rash.  Allergic/Immunologic: Negative for environmental allergies.  Neurological:  Negative for dizziness, weakness and headaches.  Hematological:  Does not bruise/bleed easily.  Psychiatric/Behavioral:  Negative for suicidal ideas. The patient is not nervous/anxious.    Patient Active Problem List   Diagnosis Date Noted   Sprain, metatarsophalangeal joint 01/26/2018   Heartburn    Dyspepsia    Change in bowel habits  Benign neoplasm of descending colon    Chronic pain of toes of both feet 09/20/2016   Type 2 diabetes mellitus with complication ([place]) 70/78/6754   Allergic rhinitis due to pollen 06/21/2015   Concussion with coma 10/21/2014   Clinical depression 09/28/2014   Diabetes mellitus, type 2 ([place])  09/28/2014   HLD (hyperlipidemia) 09/28/2014   Essential hypertension 09/28/2014   Adiposity 09/28/2014    Allergies  Allergen Reactions   Amoxicillin-Pot Clavulanate Nausea And Vomiting    (OK with Amoxicillin alone)    Past Surgical History:  Procedure Laterality Date   CESAREAN SECTION     COLONOSCOPY WITH PROPOFOL N/A 04/11/2017   Procedure: COLONOSCOPY WITH colon;  Surgeon: Lucilla Lame, MD;  Location: [facility];  Service: Endoscopy;  Laterality: N/A;   ESOPHAGOGASTRODUODENOSCOPY (EGD) WITH PROPOFOL N/A 04/11/2017   Procedure: ESOPHAGOGASTRODUODENOSCOPY (EGD) WITH PROPOFOL;  Surgeon: Lucilla Lame, MD;  Location: [facility];  Service: Endoscopy;  Laterality: N/A;  Diabetic - oral meds   GALLBLADDER SURGERY     KNEE SURGERY     MYRINGOTOMY WITH TUBE PLACEMENT Right 03/22/2020   Procedure: MYRINGOTOMY WITH TUBE PLACEMENT;  Surgeon: Carloyn Manner, MD;  Location: [facility];  Service: ENT;  Laterality: Right;  Diabetic   POLYPECTOMY  04/11/2017   Procedure: POLYPECTOMY INTESTINAL;  Surgeon: Lucilla Lame, MD;  Location: [facility];  Service: Endoscopy;;   WRIST SURGERY      Social History   Tobacco Use   Smoking status: Never   Smokeless tobacco: Never  Vaping Use   Vaping Use: Never used  Substance Use Topics   Alcohol use: Yes    Alcohol/week: 0.0 standard drinks    Comment: rare - Holidays   Drug use: No     Medication list has been reviewed and updated.  Current Meds  Medication Sig   celecoxib (CELEBREX) 200 MG capsule Take 1 capsule (200 mg total) by mouth 2 (two) times daily. ortho   cyclobenzaprine (FLEXERIL) 5 MG tablet Take 1 tablet (5 mg total) by mouth at bedtime as needed.   dicyclomine (BENTYL) 20 MG tablet TAKE 1 TABLET BY MOUTH THREE TIMES DAILY BEFORE MEAL(S)   gabapentin (NEURONTIN) 300 MG capsule Take 1 capsule by mouth at bedtime   Insulin Pen Needle (PEN NEEDLES 3/16") 31G X 5 MM MISC 1 each by Does not apply  route daily.   liraglutide (VICTOZA) 18 MG/3ML SOPN INJECT 1.8 MG INTO THE SKIN ONCE DAILY. PLEASE SCHEDULE FOLLOW UP APPT   lisinopril (ZESTRIL) 20 MG tablet Take 1 tablet (20 mg total) by mouth daily.   lovastatin (MEVACOR) 40 MG tablet TAKE 1 TABLET BY MOUTH ONCE DAILY. OFFICE VISIT NEEDED FOR FURTHER REFILLS.   metFORMIN (GLUCOPHAGE) 1000 MG tablet Take 1 tablet (1,000 mg total) by mouth 2 (two) times daily.   pantoprazole (PROTONIX) 40 MG tablet Take 1 tablet by mouth once daily   pioglitazone (ACTOS) 15 MG tablet Take 1 tablet daily   sertraline (ZOLOFT) 100 MG tablet TAKE 1 TABLET BY MOUTH ONCE DAILY ALONG WITH 50 MG TABLET FOR A TOTAL OF 150MG.   sertraline (ZOLOFT) 50 MG tablet TAKE 1 TABLET BY MOUTH ONCE DAILY ALONG WITH 100MG TABLET FOR TOTAL DAILY DOSE OF 150MG. APPOINTMENT NEEDED BEFORE 30DAYS ARE UP    PHQ 2/9 Scores 05/21/2021 01/16/2021 09/13/2020 06/22/2020  PHQ - 2 Score 0 0 1 1  PHQ- 9 Score 0 0 2 1    GAD 7 : Generalized Anxiety Score 05/21/2021  01/16/2021 09/13/2020 06/22/2020  Nervous, Anxious, on Edge 0 0 1 0  Control/stop worrying 0 0 1 2  Worry too much - different things 0 0 0 1  Trouble relaxing 0 0 0 0  Restless 0 0 0 0  Easily annoyed or irritable 0 0 0 0  Afraid - awful might happen 0 0 0 0  Total GAD 7 Score 0 0 2 3  Anxiety Difficulty Not difficult at all - - Somewhat difficult    BP Readings from Last 3 Encounters:  05/21/21 130/80  01/30/21 120/68  01/16/21 120/70    Physical Exam Vitals reviewed.  Constitutional:      Appearance: She is well-developed.  HENT:     Head: Normocephalic.     Right Ear: Tympanic membrane, ear canal and external ear normal. There is no impacted cerumen.     Left Ear: Tympanic membrane, ear canal and external ear normal. There is no impacted cerumen.     Nose: No congestion or rhinorrhea.     Mouth/Throat:     Mouth: Mucous membranes are moist.  Eyes:     General: Lids are everted, no foreign bodies appreciated. No  scleral icterus.       Left eye: No foreign body or hordeolum.     Extraocular Movements: Extraocular movements intact.     Conjunctiva/sclera: Conjunctivae normal.     Right eye: Right conjunctiva is not injected.     Left eye: Left conjunctiva is not injected.     Pupils: Pupils are equal, round, and reactive to light.  Neck:     Thyroid: No thyromegaly.     Vascular: No JVD.     Trachea: No tracheal deviation.  Cardiovascular:     Rate and Rhythm: Normal rate and regular rhythm.     Heart sounds: Normal heart sounds. No murmur heard.   No friction rub. No gallop.  Pulmonary:     Effort: Pulmonary effort is normal. No respiratory distress.     Breath sounds: Normal breath sounds. No wheezing, rhonchi or rales.  Abdominal:     General: Bowel sounds are normal.     Palpations: Abdomen is soft. There is no mass.     Tenderness: There is no abdominal tenderness. There is no guarding or rebound.  Musculoskeletal:        General: No tenderness. Normal range of motion.     Cervical back: Normal range of motion and neck supple.  Lymphadenopathy:     Cervical: No cervical adenopathy.  Skin:    General: Skin is warm.     Findings: No rash.  Neurological:     Mental Status: She is alert and oriented to person, place, and time.     Cranial Nerves: No cranial nerve deficit.     Coordination: Abnormal coordination: allergic rhin.     Deep Tendon Reflexes: Reflexes normal.  Psychiatric:        Mood and Affect: Mood is not anxious or depressed.    Wt Readings from Last 3 Encounters:  05/21/21 234 lb (106.1 kg)  01/30/21 230 lb (104.3 kg)  01/16/21 223 lb (101.2 kg)    BP 130/80    Pulse 72    Ht _0  (1.727 m)    Wt 234 lb (106.1 kg)    BMI 35.58 kg/m   Assessment and Plan:  1. Type 2 diabetes mellitus without complication, without long-term current use of insulin (HCC) Chronic.  Questionable controlled.  Stable.  Patient has  not been following diet due to illness in family.   Patient's been encouraged to continue metformin 1 g twice a day Victoza 1.8 mg once a day and pioglitazone 15 mg once a day.  Will check A1c and microalbuminuria. - liraglutide (VICTOZA) 18 MG/3ML SOPN; INJECT 1.8 MG INTO THE SKIN ONCE DAILY. PLEASE SCHEDULE FOLLOW UP APPT  Dispense: 9 mL; Refill: 0 - metFORMIN (GLUCOPHAGE) 1000 MG tablet; Take 1 tablet (1,000 mg total) by mouth 2 (two) times daily.  Dispense: 180 tablet; Refill: 1 - HgB A1c - Microalbumin, urine - pioglitazone (ACTOS) 15 MG tablet; Take 1 tablet daily  Dispense: 90 tablet; Refill: 1  2. Seasonal allergic rhinitis due to pollen Patient mentions that she is starting to get some sinus congestion with some ear discomfort on the right.  Tympanic membrane notes is normal with myringotomy tube in place.  We will call in Nasacort for nasal steroid and Singulair 10 mg once a day as well as encourage him with over-the-counter antihistamine/decongestant as needed. - triamcinolone (NASACORT) 55 MCG/ACT AERO nasal inhaler; Place 2 sprays into the nose daily.  Dispense: 1 each; Refill: 12 - montelukast (SINGULAIR) 10 MG tablet; Take 1 tablet (10 mg total) by mouth at bedtime.  Dispense: 30 tablet; Refill: 3

## 2021-05-22 ENCOUNTER — Encounter: Payer: Self-pay | Admitting: Podiatry

## 2021-05-22 ENCOUNTER — Other Ambulatory Visit: Payer: Self-pay | Admitting: Family Medicine

## 2021-05-22 ENCOUNTER — Encounter: Payer: Self-pay | Admitting: *Deleted

## 2021-05-22 ENCOUNTER — Ambulatory Visit: Payer: BC Managed Care – PPO | Admitting: Podiatry

## 2021-05-22 DIAGNOSIS — M722 Plantar fascial fibromatosis: Secondary | ICD-10-CM

## 2021-05-22 DIAGNOSIS — M7662 Achilles tendinitis, left leg: Secondary | ICD-10-CM | POA: Diagnosis not present

## 2021-05-22 DIAGNOSIS — F3341 Major depressive disorder, recurrent, in partial remission: Secondary | ICD-10-CM

## 2021-05-22 DIAGNOSIS — E782 Mixed hyperlipidemia: Secondary | ICD-10-CM

## 2021-05-22 LAB — MICROALBUMIN, URINE: Microalbumin, Urine: 3.9 ug/mL

## 2021-05-22 LAB — HEMOGLOBIN A1C
Est. average glucose Bld gHb Est-mCnc: 166 mg/dL
Hgb A1c MFr Bld: 7.4 % — ABNORMAL HIGH (ref 4.8–5.6)

## 2021-05-22 MED ORDER — GABAPENTIN 300 MG PO CAPS: 300.0000 mg | ORAL_CAPSULE | Freq: Three times a day (TID) | ORAL | 3 refills | Status: DC

## 2021-05-22 MED ORDER — BETAMETHASONE SOD PHOS & ACET 6 (3-3) MG/ML IJ SUSP
3.0000 mg | Freq: Once | INTRAMUSCULAR | Status: AC
  Administered 2021-05-22: 3 mg via INTRA_ARTICULAR

## 2021-05-22 NOTE — Progress Notes (Signed)
HPI: 61 y.o. female presenting today for an acute flareup of left lower extremity Achilles tendon and plantar fascial heel pain.  Patient was last seen here in the office 09/26/2020.  She says that she was feeling great up until about a month ago.  Also she recently went to [place], [place] and did an excessive amount of walking which aggravated the injury even more.  She presents for further treatment and evaluation.  She says that now she is experiencing some burning sensation in the posterior heel.    Past Medical History:  Diagnosis Date   Concussion May 2016   Depression    Diabetes mellitus without complication ([place])    Family history of adverse reaction to anesthesia    Father - slow to wake   GERD (gastroesophageal reflux disease)    High cholesterol    Hypertension    Yeast infection    Past Surgical History:  Procedure Laterality Date   CESAREAN SECTION     COLONOSCOPY WITH PROPOFOL N/A 04/11/2017   Procedure: COLONOSCOPY WITH colon;  Surgeon: Lucilla Lame, MD;  Location: [facility];  Service: Endoscopy;  Laterality: N/A;   ESOPHAGOGASTRODUODENOSCOPY (EGD) WITH PROPOFOL N/A 04/11/2017   Procedure: ESOPHAGOGASTRODUODENOSCOPY (EGD) WITH PROPOFOL;  Surgeon: Lucilla Lame, MD;  Location: [facility];  Service: Endoscopy;  Laterality: N/A;  Diabetic - oral meds   GALLBLADDER SURGERY     KNEE SURGERY     MYRINGOTOMY WITH TUBE PLACEMENT Right 03/22/2020   Procedure: MYRINGOTOMY WITH TUBE PLACEMENT;  Surgeon: Carloyn Manner, MD;  Location: [facility];  Service: ENT;  Laterality: Right;  Diabetic   POLYPECTOMY  04/11/2017   Procedure: POLYPECTOMY INTESTINAL;  Surgeon: Lucilla Lame, MD;  Location: [facility];  Service: Endoscopy;;   WRIST SURGERY     Allergies  Allergen Reactions   Amoxicillin-Pot Clavulanate Nausea And Vomiting    (OK with Amoxicillin alone)      Physical Exam: General: The patient is alert and oriented x3 in no acute  distress.  Dermatology: Skin is warm, dry and supple bilateral lower extremities. Negative for open lesions or macerations.  Vascular: Palpable pedal pulses bilaterally. No edema or erythema noted. Capillary refill within normal limits.  Neurological: Epicritic and protective threshold diminished bilaterally.   Musculoskeletal Exam: Pain on palpation noted to the posterior tubercle of the left calcaneus at the insertion of the Achilles tendon consistent with retrocalcaneal bursitis.  There is also pain on palpation along the plantar fascia as it inserts onto the calcaneus consistent with findings of a plantar fasciitis.  Range of motion within normal limits. Muscle strength 5/5 in all muscle groups bilateral lower extremities.  Assessment: 1. Insertional Achilles tendinitis left 2.  Plantar fasciitis left 3.  Diabetes mellitus with peripheral polyneuropathy   Plan of Care:  1. Patient was evaluated. Radiographs were reviewed today. 2. Injection of 0.5 mL Celestone Soluspan injected into the retrocalcaneal bursa. Care was taken to avoid direct injection into the Achilles tendon. 3.  Also injection of 0.5 cc Celestone Soluspan injected along the plantar fascia left 4.  No Medrol Dosepak prescribed.  Patient states that it elevates her sugar levels 5.  Continue prescription for Celebrex 100 mg 2 times daily.  Patient states that she takes Celebrex for left knee pain.  #120 with 1 refill 6.  Continue gabapentin 300 mg nightly #90 with 1 refill 7.  Cam boot dispensed.  Weightbearing as tolerated x4 weeks. 8.  Return to clinic in 4 weeks  *  Teacher's assistant at [employer]    Edrick Kins, DPM [facility]  Dr. Edrick Kins, DPM    [address], [place] [address]                Office [phone]  Fax [phone]

## 2021-05-31 ENCOUNTER — Other Ambulatory Visit: Payer: Self-pay | Admitting: Gastroenterology

## 2021-05-31 ENCOUNTER — Telehealth: Payer: Self-pay | Admitting: Gastroenterology

## 2021-05-31 NOTE — Telephone Encounter (Signed)
Pt is requesting refill on her pantoprazole (Protonix)  [facility] [address] ?

## 2021-06-01 MED ORDER — PANTOPRAZOLE SODIUM 40 MG PO TBEC: 40.0000 mg | DELAYED_RELEASE_TABLET | Freq: Every day | ORAL | 0 refills | Status: DC

## 2021-06-01 NOTE — Addendum Note (Signed)
Addended by: Lurlean Nanny on: 06/01/2021 12:32 PM ? ? Modules accepted: Orders ? ?

## 2021-06-01 NOTE — Telephone Encounter (Signed)
Rx sent through e-scribe  

## 2021-06-12 ENCOUNTER — Ambulatory Visit: Payer: BC Managed Care – PPO | Admitting: Podiatry

## 2021-06-20 ENCOUNTER — Ambulatory Visit (INDEPENDENT_AMBULATORY_CARE_PROVIDER_SITE_OTHER): Payer: BC Managed Care – PPO | Admitting: Gastroenterology

## 2021-06-20 ENCOUNTER — Encounter: Payer: Self-pay | Admitting: Gastroenterology

## 2021-06-20 ENCOUNTER — Other Ambulatory Visit: Payer: Self-pay

## 2021-06-20 VITALS — BP 125/84 | HR 90 | Temp 98.6°F | Wt 232.0 lb

## 2021-06-20 DIAGNOSIS — K580 Irritable bowel syndrome with diarrhea: Secondary | ICD-10-CM

## 2021-06-20 MED ORDER — PANTOPRAZOLE SODIUM 40 MG PO TBEC: 40.0000 mg | DELAYED_RELEASE_TABLET | Freq: Every day | ORAL | 2 refills | Status: DC

## 2021-06-20 MED ORDER — DICYCLOMINE HCL 20 MG PO TABS: ORAL_TABLET | ORAL | 10 refills | Status: DC

## 2021-06-20 NOTE — Progress Notes (Signed)
? ? ?Primary Care Physician: Juline Patch, MD ? ?Primary Gastroenterologist:  Dr. Lucilla Lame ? ?Chief Complaint  ?Patient presents with  ? Follow-up  ?  IBS  ? ? ?HPI: Betty Walters is a 61 y.o. female here who comes in today with a history of Faera bowel syndrome.  The patient has been under a lot of stress recently with the death of her father last week after a long course of illness.  The patient reports that she was having regular nausea with vomiting and then she states that she would feel better after she vomited.  The patient then states that her nausea vomiting has resolved but she still has bouts of diarrhea.  The patient says that the diarrhea usually last about 2 days.  She is taking pantoprazole and her dicyclomine.  The patient has lost some work from the time she had to take off to be with her father and also has lost work due to the diarrhea. ? ?Past Medical History:  ?Diagnosis Date  ? Concussion May 2016  ? Depression   ? Diabetes mellitus without complication ([place])   ? Family history of adverse reaction to anesthesia   ? Father - slow to wake  ? GERD (gastroesophageal reflux disease)   ? High cholesterol   ? Hypertension   ? Yeast infection   ? ? ?Current Outpatient Medications  ?Medication Sig Dispense Refill  ? celecoxib (CELEBREX) 200 MG capsule Take 1 capsule (200 mg total) by mouth 2 (two) times daily. ortho 120 capsule 1  ? cyclobenzaprine (FLEXERIL) 5 MG tablet Take 1 tablet (5 mg total) by mouth at bedtime as needed. 30 tablet 5  ? dicyclomine (BENTYL) 20 MG tablet TAKE 1 TABLET BY MOUTH THREE TIMES DAILY BEFORE MEAL(S) 90 tablet 1  ? gabapentin (NEURONTIN) 300 MG capsule Take 1 capsule (300 mg total) by mouth 3 (three) times daily. 90 capsule 3  ? guaiFENesin-codeine (ROBITUSSIN AC) 100-10 MG/5ML syrup Take 5 mLs by mouth 3 (three) times daily as needed for cough. (Patient not taking: Reported on 05/21/2021) 118 mL 0  ? Insulin Pen Needle (PEN NEEDLES 3/16") 31G X 5 MM MISC 1 each by  Does not apply route daily. 100 each 0  ? liraglutide (VICTOZA) 18 MG/3ML SOPN INJECT 1.8 MG INTO THE SKIN ONCE DAILY. PLEASE SCHEDULE FOLLOW UP APPT 9 mL 0  ? lisinopril (ZESTRIL) 20 MG tablet Take 1 tablet (20 mg total) by mouth daily. 90 tablet 1  ? lovastatin (MEVACOR) 40 MG tablet TAKE 1 TABLET BY MOUTH ONCE DAILY. OFFICE VISIT NEEDED FOR FURTHER REFILLS 90 tablet 0  ? metFORMIN (GLUCOPHAGE) 1000 MG tablet Take 1 tablet (1,000 mg total) by mouth 2 (two) times daily. 180 tablet 1  ? montelukast (SINGULAIR) 10 MG tablet Take 1 tablet (10 mg total) by mouth at bedtime. 30 tablet 3  ? pantoprazole (PROTONIX) 40 MG tablet Take 1 tablet (40 mg total) by mouth daily. 90 tablet 0  ? pioglitazone (ACTOS) 15 MG tablet Take 1 tablet daily 90 tablet 1  ? sertraline (ZOLOFT) 100 MG tablet TAKE 1 TABLET BY MOUTH ONCE DAILY ALONG WITH 50 MG TABLET FOR A TOTAL OF '150MG'$ . 90 tablet 1  ? sertraline (ZOLOFT) 50 MG tablet TAKE 1 TABLET BY MOUTH ONCE DAILY ALONG WITH 100 MG TABLET FOR A TOTAL DOSE OF '150MG'$ . APPT NEEDED BEFORE 30 DAYS ARE UP 90 tablet 0  ? triamcinolone (NASACORT) 55 MCG/ACT AERO nasal inhaler Place 2 sprays into  the nose daily. 1 each 12  ? ?No current facility-administered medications for this visit.  ? ? ?Allergies as of 06/20/2021 - Review Complete 06/20/2021  ?Allergen Reaction Noted  ? Amoxicillin-pot clavulanate Nausea And Vomiting 01/26/2014  ? ? ?ROS: ? ?General: Negative for anorexia, weight loss, fever, chills, fatigue, weakness. ?ENT: Negative for hoarseness, difficulty swallowing , nasal congestion. ?CV: Negative for chest pain, angina, palpitations, dyspnea on exertion, peripheral edema.  ?Respiratory: Negative for dyspnea at rest, dyspnea on exertion, cough, sputum, wheezing.  ?GI: See history of present illness. ?GU:  Negative for dysuria, hematuria, urinary incontinence, urinary frequency, nocturnal urination.  ?Endo: Negative for unusual weight change.  ?  ?Physical Examination: ? ? There were no  vitals taken for this visit. ? ?General: Well-nourished, well-developed in no acute distress.  ?Eyes: No icterus. Conjunctivae pink. ?Lungs: Clear to auscultation bilaterally. Non-labored. ?Heart: Regular rate and rhythm, no murmurs rubs or gallops.  ?Abdomen: Bowel sounds are normal, nontender, nondistended, no hepatosplenomegaly or masses, no abdominal bruits or hernia , no rebound or guarding.   ?Extremities: No lower extremity edema. No clubbing or deformities. ?Neuro: Alert and oriented x 3.  Grossly intact. ?Skin: Warm and dry, no jaundice.   ?Psych: Alert and cooperative, normal mood and affect. ? ?Labs:  ?  ?Imaging Studies: ?No results found. ? ?Assessment and Plan:  ? ?Betty Walters is a 61 y.o. y/o female who comes in today with a history of irritable bowel syndrome with diarrhea predominance.  The patient had nausea but that has resolved.  The patient reports that she needs a refill of her dicyclomine and her pantoprazole.  The patient has also been instructed to take Imodium when she has the attacks of diarrhea so that she can make it to work.  The patient will contact me if her symptoms do not improve.  The patient has been explained the plan agrees with it. ? ? ? ? ?Lucilla Lame, MD. [facility] ? ? ? Note: This dictation was prepared with Dragon dictation along with smaller phrase technology. Any transcriptional errors that result from this process are unintentional.  ?

## 2021-07-13 ENCOUNTER — Other Ambulatory Visit: Payer: Self-pay | Admitting: Podiatry

## 2021-07-13 NOTE — Telephone Encounter (Signed)
Please advise 

## 2021-08-22 ENCOUNTER — Other Ambulatory Visit: Payer: Self-pay | Admitting: Family Medicine

## 2021-08-22 DIAGNOSIS — I10 Essential (primary) hypertension: Secondary | ICD-10-CM

## 2021-08-22 DIAGNOSIS — F3341 Major depressive disorder, recurrent, in partial remission: Secondary | ICD-10-CM

## 2021-09-02 ENCOUNTER — Other Ambulatory Visit: Payer: Self-pay | Admitting: Podiatry

## 2021-09-05 NOTE — Telephone Encounter (Signed)
Please advise 

## 2021-09-19 ENCOUNTER — Ambulatory Visit: Payer: BC Managed Care – PPO | Admitting: Family Medicine

## 2021-10-11 ENCOUNTER — Encounter: Payer: Self-pay | Admitting: Family Medicine

## 2021-10-11 ENCOUNTER — Ambulatory Visit: Payer: BC Managed Care – PPO | Admitting: Family Medicine

## 2021-10-11 VITALS — BP 120/80 | HR 72 | Ht 68.0 in | Wt 233.0 lb

## 2021-10-11 DIAGNOSIS — I10 Essential (primary) hypertension: Secondary | ICD-10-CM | POA: Diagnosis not present

## 2021-10-11 DIAGNOSIS — E782 Mixed hyperlipidemia: Secondary | ICD-10-CM

## 2021-10-11 DIAGNOSIS — M62838 Other muscle spasm: Secondary | ICD-10-CM

## 2021-10-11 DIAGNOSIS — E119 Type 2 diabetes mellitus without complications: Secondary | ICD-10-CM | POA: Diagnosis not present

## 2021-10-11 DIAGNOSIS — J301 Allergic rhinitis due to pollen: Secondary | ICD-10-CM

## 2021-10-11 DIAGNOSIS — F3341 Major depressive disorder, recurrent, in partial remission: Secondary | ICD-10-CM

## 2021-10-11 MED ORDER — CYCLOBENZAPRINE HCL 5 MG PO TABS: 5.0000 mg | ORAL_TABLET | Freq: Every evening | ORAL | 5 refills | Status: DC | PRN

## 2021-10-11 MED ORDER — MONTELUKAST SODIUM 10 MG PO TABS: 10.0000 mg | ORAL_TABLET | Freq: Every day | ORAL | 3 refills | Status: DC

## 2021-10-11 MED ORDER — METFORMIN HCL 1000 MG PO TABS: 1000.0000 mg | ORAL_TABLET | Freq: Two times a day (BID) | ORAL | 1 refills | Status: DC

## 2021-10-11 MED ORDER — SERTRALINE HCL 100 MG PO TABS: ORAL_TABLET | ORAL | 0 refills | Status: DC

## 2021-10-11 MED ORDER — LISINOPRIL 20 MG PO TABS: 20.0000 mg | ORAL_TABLET | Freq: Every day | ORAL | 1 refills | Status: DC

## 2021-10-11 MED ORDER — PIOGLITAZONE HCL 15 MG PO TABS: ORAL_TABLET | ORAL | 1 refills | Status: DC

## 2021-10-11 MED ORDER — VICTOZA 18 MG/3ML ~~LOC~~ SOPN: PEN_INJECTOR | SUBCUTANEOUS | 1 refills | Status: DC

## 2021-10-11 MED ORDER — SERTRALINE HCL 50 MG PO TABS: ORAL_TABLET | ORAL | 0 refills | Status: DC

## 2021-10-11 MED ORDER — LOVASTATIN 40 MG PO TABS: ORAL_TABLET | ORAL | 1 refills | Status: DC

## 2021-10-11 NOTE — Progress Notes (Signed)
Date:  10/11/2021   Name:  Betty Walters   DOB:  03-14-1961   MRN:  [id number]   Chief Complaint: Depression, Diabetes, Allergic Rhinitis , Hyperlipidemia, Hypertension, and Spasms  Depression        This is a chronic problem.  The current episode started more than 1 year ago.   The onset quality is sudden.   The problem occurs intermittently.  The problem has been gradually improving since onset.  Associated symptoms include fatigue and insomnia.  Associated symptoms include no decreased concentration, no helplessness, no hopelessness, not irritable, no restlessness, no decreased interest, no appetite change, no body aches, no myalgias, no headaches, no indigestion, not sad and no suicidal ideas.     The symptoms are aggravated by nothing.  Past treatments include SSRIs - Selective serotonin reuptake inhibitors.  Compliance with treatment is good.  Previous treatment provided mild relief.   Pertinent negatives include no hypothyroidism. Diabetes She presents for her follow-up diabetic visit. There are no hypoglycemic associated symptoms. Pertinent negatives for hypoglycemia include no dizziness, headaches or nervousness/anxiousness. Associated symptoms include fatigue. Pertinent negatives for diabetes include no blurred vision, no chest pain, no foot paresthesias, no foot ulcerations, no polydipsia, no polyphagia, no polyuria, no visual change, no weakness and no weight loss. There are no hypoglycemic complications. Symptoms are stable. There are no diabetic complications. Pertinent negatives for diabetic complications include no CVA, PVD or retinopathy. Risk factors for coronary artery disease include dyslipidemia and hypertension. Current diabetic treatment includes oral agent (dual therapy) (and victoza). Her weight is fluctuating minimally. She is following a generally unhealthy diet. Meal planning includes avoidance of concentrated sweets and carbohydrate counting. Her home blood glucose trend  is decreasing steadily. An ACE inhibitor/angiotensin II receptor blocker is being taken.  Hyperlipidemia This is a chronic problem. The current episode started more than 1 year ago. The problem is controlled. Recent lipid tests were reviewed and are normal. She has no history of chronic renal disease, diabetes, hypothyroidism, liver disease, obesity or nephrotic syndrome. Pertinent negatives include no chest pain, myalgias or shortness of breath. Current antihyperlipidemic treatment includes statins. The current treatment provides moderate improvement of lipids. There are no compliance problems.  Risk factors for coronary artery disease include dyslipidemia, hypertension and diabetes mellitus.  Hypertension This is a chronic problem. The current episode started more than 1 year ago. The problem has been gradually improving since onset. The problem is controlled. Pertinent negatives include no blurred vision, chest pain, headaches, neck pain, orthopnea, palpitations, PND or shortness of breath. There are no associated agents to hypertension. Risk factors for coronary artery disease include dyslipidemia. Past treatments include ACE inhibitors. The current treatment provides moderate improvement. There are no compliance problems.  There is no history of angina, kidney disease, CAD/MI, CVA, heart failure, left ventricular hypertrophy, PVD or retinopathy. There is no history of chronic renal disease, a hypertension causing med or renovascular disease.    Lab Results  Component Value Date   NA 137 01/16/2021   K 5.1 01/16/2021   CO2 20 01/16/2021   GLUCOSE 117 (H) 01/16/2021   BUN 28 (H) 01/16/2021   CREATININE 0.94 01/16/2021   CALCIUM 10.2 01/16/2021   EGFR 70 01/16/2021   GFRNONAA 71 04/25/2020   Lab Results  Component Value Date   CHOL 202 (H) 01/16/2021   HDL 59 01/16/2021   LDLCALC 113 (H) 01/16/2021   TRIG 174 (H) 01/16/2021   CHOLHDL 4.6 (H) 08/05/2018   No  results found for: "TSH" Lab  Results  Component Value Date   HGBA1C 7.4 (H) 05/21/2021   Lab Results  Component Value Date   WBC 9.7 02/05/2017   HGB 13.1 02/05/2017   HCT 38.9 02/05/2017   MCV 86.1 02/05/2017   PLT 301 02/05/2017   Lab Results  Component Value Date   ALT 11 01/16/2021   AST 12 01/16/2021   ALKPHOS 91 01/16/2021   BILITOT 0.2 01/16/2021   No results found for: "25OHVITD2", "25OHVITD3", "VD25OH"   Review of Systems  Constitutional:  Positive for fatigue. Negative for appetite change, chills, fever and weight loss.  HENT:  Negative for drooling, ear discharge, ear pain and sore throat.   Eyes:  Negative for blurred vision.  Respiratory:  Negative for cough, shortness of breath and wheezing.   Cardiovascular:  Negative for chest pain, palpitations, orthopnea, leg swelling and PND.  Gastrointestinal:  Negative for abdominal pain, blood in stool, constipation, diarrhea and nausea.  Endocrine: Negative for polydipsia, polyphagia and polyuria.  Genitourinary:  Negative for dysuria, frequency, hematuria and urgency.  Musculoskeletal:  Negative for back pain, myalgias and neck pain.  Skin:  Negative for rash.  Allergic/Immunologic: Negative for environmental allergies.  Neurological:  Negative for dizziness, weakness and headaches.  Hematological:  Does not bruise/bleed easily.  Psychiatric/Behavioral:  Positive for depression. Negative for decreased concentration and suicidal ideas. The patient has insomnia. The patient is not nervous/anxious.     Patient Active Problem List   Diagnosis Date Noted   Sprain, metatarsophalangeal joint 01/26/2018   Heartburn    Dyspepsia    Change in bowel habits    Benign neoplasm of descending colon    Chronic pain of toes of both feet 09/20/2016   Type 2 diabetes mellitus with complication ([facility]) 26/20/3559   Allergic rhinitis due to pollen 06/21/2015   Concussion with coma 10/21/2014   Clinical depression 09/28/2014   Diabetes mellitus, type 2 ([facility])  09/28/2014   HLD (hyperlipidemia) 09/28/2014   Essential hypertension 09/28/2014   Adiposity 09/28/2014    Allergies  Allergen Reactions   Amoxicillin-Pot Clavulanate Nausea And Vomiting    (OK with Amoxicillin alone)    Past Surgical History:  Procedure Laterality Date   CESAREAN SECTION     COLONOSCOPY WITH PROPOFOL N/A 04/11/2017   Procedure: COLONOSCOPY WITH colon;  Surgeon: Lucilla Lame, MD;  Location: [facility];  Service: Endoscopy;  Laterality: N/A;   ESOPHAGOGASTRODUODENOSCOPY (EGD) WITH PROPOFOL N/A 04/11/2017   Procedure: ESOPHAGOGASTRODUODENOSCOPY (EGD) WITH PROPOFOL;  Surgeon: Lucilla Lame, MD;  Location: [facility];  Service: Endoscopy;  Laterality: N/A;  Diabetic - oral meds   GALLBLADDER SURGERY     KNEE SURGERY     MYRINGOTOMY WITH TUBE PLACEMENT Right 03/22/2020   Procedure: MYRINGOTOMY WITH TUBE PLACEMENT;  Surgeon: Carloyn Manner, MD;  Location: [facility];  Service: ENT;  Laterality: Right;  Diabetic   POLYPECTOMY  04/11/2017   Procedure: POLYPECTOMY INTESTINAL;  Surgeon: Lucilla Lame, MD;  Location: [facility];  Service: Endoscopy;;   WRIST SURGERY      Social History   Tobacco Use   Smoking status: Never   Smokeless tobacco: Never  Vaping Use   Vaping Use: Never used  Substance Use Topics   Alcohol use: Yes    Alcohol/week: 0.0 standard drinks of alcohol    Comment: rare - Holidays   Drug use: No     Medication list has been reviewed and updated.  Current Meds  Medication  Sig   celecoxib (CELEBREX) 200 MG capsule Take 1 capsule by mouth twice daily   cyclobenzaprine (FLEXERIL) 5 MG tablet Take 1 tablet (5 mg total) by mouth at bedtime as needed.   dicyclomine (BENTYL) 20 MG tablet TAKE 1 TABLET BY MOUTH THREE TIMES DAILY BEFORE MEAL(S)   gabapentin (NEURONTIN) 300 MG capsule Take 1 capsule by mouth at bedtime   Insulin Pen Needle (PEN NEEDLES 3/16") 31G X 5 MM MISC 1 each by Does not apply route daily.    liraglutide (VICTOZA) 18 MG/3ML SOPN INJECT 1.8 MG INTO THE SKIN ONCE DAILY. PLEASE SCHEDULE FOLLOW UP APPT   lisinopril (ZESTRIL) 20 MG tablet Take 1 tablet by mouth once daily   lovastatin (MEVACOR) 40 MG tablet TAKE 1 TABLET BY MOUTH ONCE DAILY. OFFICE VISIT NEEDED FOR FURTHER REFILLS   metFORMIN (GLUCOPHAGE) 1000 MG tablet Take 1 tablet (1,000 mg total) by mouth 2 (two) times daily.   montelukast (SINGULAIR) 10 MG tablet Take 1 tablet (10 mg total) by mouth at bedtime.   pantoprazole (PROTONIX) 40 MG tablet Take 1 tablet (40 mg total) by mouth daily.   pioglitazone (ACTOS) 15 MG tablet Take 1 tablet daily   sertraline (ZOLOFT) 100 MG tablet TAKE 1 TABLET BY MOUTH ONCE DAILY TAKE ALONG WITH 50MG TABLET FOR A TOTAL OF 150MG   sertraline (ZOLOFT) 50 MG tablet TAKE 1 TABLET BY MOUTH ONCE DAILY ALONG WITH 100 MG TABLET FOR A TOTAL DOSE OF 150MG. APPT NEEDED BEFORE 30 DAYS ARE UP       10/11/2021   11:09 AM 05/21/2021   10:19 AM 01/16/2021   10:29 AM 09/13/2020   10:56 AM  GAD 7 : Generalized Anxiety Score  Nervous, Anxious, on Edge 0 0 0 1  Control/stop worrying 0 0 0 1  Worry too much - different things 1 0 0 0  Trouble relaxing 0 0 0 0  Restless 0 0 0 0  Easily annoyed or irritable 1 0 0 0  Afraid - awful might happen 0 0 0 0  Total GAD 7 Score 2 0 0 2  Anxiety Difficulty Not difficult at all Not difficult at all         10/11/2021   11:09 AM 05/21/2021   10:18 AM 01/16/2021   10:29 AM  Depression screen PHQ 2/9  Decreased Interest 0 0 0  Down, Depressed, Hopeless 0 0 0  PHQ - 2 Score 0 0 0  Altered sleeping 1 0 0  Tired, decreased energy 1 0 0  Change in appetite 0 0 0  Feeling bad or failure about yourself  0 0 0  Trouble concentrating 0 0 0  Moving slowly or fidgety/restless 0 0 0  Suicidal thoughts 0 0 0  PHQ-9 Score 2 0 0  Difficult doing work/chores Not difficult at all Not difficult at all     BP Readings from Last 3 Encounters:  10/11/21 120/80  06/20/21 125/84   05/21/21 130/80    Physical Exam Vitals and nursing note reviewed.  Constitutional:      General: She is not irritable.    Appearance: She is well-developed.  HENT:     Head: Normocephalic.     Right Ear: Tympanic membrane and external ear normal.     Left Ear: Tympanic membrane and external ear normal.     Mouth/Throat:     Mouth: Mucous membranes are moist.  Eyes:     General: Lids are everted, no foreign bodies appreciated. No  scleral icterus.       Left eye: No foreign body or hordeolum.     Conjunctiva/sclera: Conjunctivae normal.     Right eye: Right conjunctiva is not injected.     Left eye: Left conjunctiva is not injected.     Pupils: Pupils are equal, round, and reactive to light.  Neck:     Thyroid: No thyromegaly.     Vascular: No JVD.     Trachea: No tracheal deviation.  Cardiovascular:     Rate and Rhythm: Normal rate and regular rhythm.     Heart sounds: Normal heart sounds, S1 normal and S2 normal. No murmur heard.    No systolic murmur is present.     No diastolic murmur is present.     No friction rub. No gallop. No S3 or S4 sounds.  Pulmonary:     Effort: Pulmonary effort is normal. No respiratory distress.     Breath sounds: Normal breath sounds. No wheezing, rhonchi or rales.  Abdominal:     General: Bowel sounds are normal.     Palpations: Abdomen is soft. There is no hepatomegaly, splenomegaly or mass.     Tenderness: There is no abdominal tenderness. There is no guarding or rebound.  Musculoskeletal:        General: No tenderness. Normal range of motion.     Cervical back: Normal range of motion and neck supple.     Right lower leg: No edema.     Left lower leg: No edema.  Lymphadenopathy:     Cervical: No cervical adenopathy.  Skin:    General: Skin is warm.     Findings: No rash.  Neurological:     Mental Status: She is alert and oriented to person, place, and time.     Cranial Nerves: No cranial nerve deficit.     Deep Tendon Reflexes:      Reflex Scores:      Patellar reflexes are 2+ on the right side and 2+ on the left side. Psychiatric:        Mood and Affect: Mood is not anxious or depressed.     Wt Readings from Last 3 Encounters:  10/11/21 233 lb (105.7 kg)  06/20/21 232 lb (105.2 kg)  05/21/21 234 lb (106.1 kg)    BP 120/80   Pulse 72   Ht '5\' 8"'  (1.727 m)   Wt 233 lb (105.7 kg)   BMI 35.43 kg/m   Assessment and Plan:  1. Muscle spasm Chronic.  Controlled.  Stable.  Continue cyclobenzaprine 5 mg nightly. - cyclobenzaprine (FLEXERIL) 5 MG tablet; Take 1 tablet (5 mg total) by mouth at bedtime as needed.  Dispense: 30 tablet; Refill: 5  2. Type 2 diabetes mellitus without complication, without long-term current use of insulin (HCC) Chronic.  Controlled.  Stable.  Continue Victoza at current dosing 1.8 mg daily as well as metformin 1 g twice a day.  And pioglitazone 15 mg 1 tablet daily.  We will check A1c and renal function panel. - liraglutide (VICTOZA) 18 MG/3ML SOPN; INJECT 1.8 MG INTO THE SKIN ONCE DAILY.  Dispense: 9 mL; Refill: 1 - metFORMIN (GLUCOPHAGE) 1000 MG tablet; Take 1 tablet (1,000 mg total) by mouth 2 (two) times daily.  Dispense: 180 tablet; Refill: 1 - pioglitazone (ACTOS) 15 MG tablet; Take 1 tablet daily  Dispense: 90 tablet; Refill: 1 - HgB A1c - Renal Function Panel  3. Essential hypertension Chronic.  Controlled.  Stable.  Blood pressure 120/80.  Continue lisinopril 20 mg once a day.  We will check renal function panel for electrolytes and GFR. - lisinopril (ZESTRIL) 20 MG tablet; Take 1 tablet (20 mg total) by mouth daily.  Dispense: 90 tablet; Refill: 1 - Renal Function Panel  4. Mixed hyperlipidemia Chronic.  Controlled.  Stable.  Continue lovastatin 40 mg once a day.  Check lipid panel. - lovastatin (MEVACOR) 40 MG tablet; Take 1 tablet dailyTAKE 1 TABLET BY MOUTH ONCE DAILY.  Dispense: 90 tablet; Refill: 1 - Lipid Panel With LDL/HDL Ratio  5. Seasonal allergic rhinitis due  to pollen Chronic.  Controlled.  Continue Singulair 10 mg once a day. - montelukast (SINGULAIR) 10 MG tablet; Take 1 tablet (10 mg total) by mouth at bedtime.  Dispense: 30 tablet; Refill: 3  6. Recurrent major depressive disorder, in partial remission (HCC) Chronic.  Controlled.  Stable.  PHQ is 2 Gad score is 0 continue sertraline 150 mg daily. - sertraline (ZOLOFT) 100 MG tablet; TAKE 1 TABLET BY MOUTH ONCE DAILY TAKE ALONG WITH 50MG TABLET FOR A TOTAL OF 150MG  Dispense: 90 tablet; Refill: 0 - sertraline (ZOLOFT) 50 MG tablet; TAKE 1 TABLET BY MOUTH ONCE DAILY ALONG WITH 100 MG TABLET FOR A TOTAL DOSE OF 150MG. APPT NEEDED BEFORE 30 DAYS ARE UP  Dispense: 90 tablet; Refill: 0

## 2021-10-12 LAB — RENAL FUNCTION PANEL
Albumin: 4.7 g/dL (ref 3.8–4.9)
BUN/Creatinine Ratio: 34 — ABNORMAL HIGH (ref 9–23)
BUN: 30 mg/dL — ABNORMAL HIGH (ref 6–24)
CO2: 20 mmol/L (ref 20–29)
Calcium: 9.9 mg/dL (ref 8.7–10.2)
Chloride: 102 mmol/L (ref 96–106)
Creatinine, Ser: 0.88 mg/dL (ref 0.57–1.00)
Glucose: 160 mg/dL — ABNORMAL HIGH (ref 70–99)
Phosphorus: 3.8 mg/dL (ref 3.0–4.3)
Potassium: 4.9 mmol/L (ref 3.5–5.2)
Sodium: 137 mmol/L (ref 134–144)
eGFR: 76 mL/min/{1.73_m2} (ref >59)

## 2021-10-12 LAB — LIPID PANEL WITH LDL/HDL RATIO
Cholesterol, Total: 198 mg/dL (ref 100–199)
HDL: 58 mg/dL (ref >39)
LDL Chol Calc (NIH): 110 mg/dL — ABNORMAL HIGH (ref 0–99)
LDL/HDL Ratio: 1.9 ratio (ref 0.0–3.2)
Triglycerides: 171 mg/dL — ABNORMAL HIGH (ref 0–149)
VLDL Cholesterol Cal: 30 mg/dL (ref 5–40)

## 2021-10-12 LAB — HEMOGLOBIN A1C
Est. average glucose Bld gHb Est-mCnc: 166 mg/dL
Hgb A1c MFr Bld: 7.4 % — ABNORMAL HIGH (ref 4.8–5.6)

## 2021-10-30 ENCOUNTER — Ambulatory Visit: Payer: BC Managed Care – PPO | Admitting: Podiatry

## 2021-10-30 ENCOUNTER — Ambulatory Visit (INDEPENDENT_AMBULATORY_CARE_PROVIDER_SITE_OTHER): Payer: BC Managed Care – PPO

## 2021-10-30 DIAGNOSIS — M7662 Achilles tendinitis, left leg: Secondary | ICD-10-CM

## 2021-10-30 MED ORDER — BETAMETHASONE SOD PHOS & ACET 6 (3-3) MG/ML IJ SUSP
3.0000 mg | Freq: Once | INTRAMUSCULAR | Status: AC
  Administered 2021-10-30: 3 mg via INTRA_ARTICULAR

## 2021-10-30 NOTE — Progress Notes (Signed)
HPI: 61 y.o. female presenting today for an acute flareup of left lower extremity Achilles tendon and plantar fascial heel pain.  Patient was last seen here in the office 09/26/2020.  She says that she was feeling great up until about a month ago.  Also she recently went to [place], [place] and did an excessive amount of walking which aggravated the injury even more.  She presents for further treatment and evaluation.  She says that now she is experiencing some burning sensation in the posterior heel.    Past Medical History:  Diagnosis Date   Concussion May 2016   Depression    Diabetes mellitus without complication ([facility])    Family history of adverse reaction to anesthesia    Father - slow to wake   GERD (gastroesophageal reflux disease)    High cholesterol    Hypertension    Yeast infection    Past Surgical History:  Procedure Laterality Date   CESAREAN SECTION     COLONOSCOPY WITH PROPOFOL N/A 04/11/2017   Procedure: COLONOSCOPY WITH colon;  Surgeon: Lucilla Lame, MD;  Location: [facility];  Service: Endoscopy;  Laterality: N/A;   ESOPHAGOGASTRODUODENOSCOPY (EGD) WITH PROPOFOL N/A 04/11/2017   Procedure: ESOPHAGOGASTRODUODENOSCOPY (EGD) WITH PROPOFOL;  Surgeon: Lucilla Lame, MD;  Location: [facility];  Service: Endoscopy;  Laterality: N/A;  Diabetic - oral meds   GALLBLADDER SURGERY     KNEE SURGERY     MYRINGOTOMY WITH TUBE PLACEMENT Right 03/22/2020   Procedure: MYRINGOTOMY WITH TUBE PLACEMENT;  Surgeon: Carloyn Manner, MD;  Location: [facility];  Service: ENT;  Laterality: Right;  Diabetic   POLYPECTOMY  04/11/2017   Procedure: POLYPECTOMY INTESTINAL;  Surgeon: Lucilla Lame, MD;  Location: [facility];  Service: Endoscopy;;   WRIST SURGERY     Allergies  Allergen Reactions   Amoxicillin-Pot Clavulanate Nausea And Vomiting    (OK with Amoxicillin alone)      Physical Exam: General: The patient is alert and oriented x3 in no acute  distress.  Dermatology: Skin is warm, dry and supple bilateral lower extremities. Negative for open lesions or macerations.  Vascular: Palpable pedal pulses bilaterally. No edema or erythema noted. Capillary refill within normal limits.  Neurological: Epicritic and protective threshold diminished bilaterally.   Musculoskeletal Exam: Pain on palpation noted to the posterior tubercle of the left calcaneus at the insertion of the Achilles tendon consistent with retrocalcaneal bursitis.  There is also pain on palpation along the plantar fascia as it inserts onto the calcaneus consistent with findings of a plantar fasciitis.  Range of motion within normal limits. Muscle strength 5/5 in all muscle groups bilateral lower extremities.  Radiographic exam LT foot 10/30/2021: Normal osseous mineralization.  Posterior and plantar heel spurs noted on lateral view.  Joint spaces mostly preserved.  No significant degenerative changes.  Assessment: 1. Insertional Achilles tendinitis left 2. Diabetes mellitus with peripheral polyneuropathy   Plan of Care:  1. Patient was evaluated. Radiographs were reviewed today. 2. Injection of 0.5 mL Celestone Soluspan injected into the retrocalcaneal bursa along the posterior tubercle of the calcaneus. Care was taken to avoid direct injection into the Achilles tendon. 3.  No Medrol Dosepak prescribed.  Patient states that it elevates her sugar levels 4.  Continue prescription for Celebrex 100 mg 2 times daily.  Patient states that she takes Celebrex for left knee pain.  #120 with 1 refill 5.  Continue gabapentin 300 mg nightly as prescribed 7.  Order placed for physical  therapy exterior PT 8.  Return to clinic 6 weeks  *Optometrist at [facility]    Edrick Kins, DPM [facility]  Dr. Edrick Kins, DPM    [address], [place] [address]                Office [phone]  Fax  [phone]

## 2021-12-10 ENCOUNTER — Inpatient Hospital Stay
Admission: EM | Admit: 2021-12-10 | Discharge: 2021-12-12 | DRG: 690 | Disposition: A | Discharge status: Home / Self Care | Payer: BC Managed Care – PPO | Attending: Internal Medicine | Admitting: Internal Medicine

## 2021-12-10 ENCOUNTER — Emergency Department: Payer: BC Managed Care – PPO

## 2021-12-10 ENCOUNTER — Ambulatory Visit: Payer: Self-pay

## 2021-12-10 ENCOUNTER — Other Ambulatory Visit: Payer: Self-pay

## 2021-12-10 DIAGNOSIS — N12 Tubulo-interstitial nephritis, not specified as acute or chronic: Secondary | ICD-10-CM | POA: Diagnosis present

## 2021-12-10 DIAGNOSIS — K219 Gastro-esophageal reflux disease without esophagitis: Secondary | ICD-10-CM

## 2021-12-10 DIAGNOSIS — Z833 Family history of diabetes mellitus: Secondary | ICD-10-CM | POA: Diagnosis not present

## 2021-12-10 DIAGNOSIS — Z79899 Other long term (current) drug therapy: Secondary | ICD-10-CM

## 2021-12-10 DIAGNOSIS — Z7985 Long-term (current) use of injectable non-insulin antidiabetic drugs: Secondary | ICD-10-CM | POA: Diagnosis not present

## 2021-12-10 DIAGNOSIS — F32A Depression, unspecified: Secondary | ICD-10-CM | POA: Diagnosis present

## 2021-12-10 DIAGNOSIS — E785 Hyperlipidemia, unspecified: Secondary | ICD-10-CM | POA: Diagnosis not present

## 2021-12-10 DIAGNOSIS — I10 Essential (primary) hypertension: Secondary | ICD-10-CM | POA: Diagnosis present

## 2021-12-10 DIAGNOSIS — Z87820 Personal history of traumatic brain injury: Secondary | ICD-10-CM | POA: Diagnosis not present

## 2021-12-10 DIAGNOSIS — N10 Acute tubulo-interstitial nephritis: Secondary | ICD-10-CM | POA: Diagnosis present

## 2021-12-10 DIAGNOSIS — K589 Irritable bowel syndrome without diarrhea: Secondary | ICD-10-CM | POA: Diagnosis present

## 2021-12-10 DIAGNOSIS — E7800 Pure hypercholesterolemia, unspecified: Secondary | ICD-10-CM | POA: Diagnosis present

## 2021-12-10 DIAGNOSIS — Z20822 Contact with and (suspected) exposure to covid-19: Secondary | ICD-10-CM | POA: Diagnosis present

## 2021-12-10 DIAGNOSIS — N179 Acute kidney failure, unspecified: Secondary | ICD-10-CM | POA: Diagnosis present

## 2021-12-10 DIAGNOSIS — Z7984 Long term (current) use of oral hypoglycemic drugs: Secondary | ICD-10-CM | POA: Diagnosis not present

## 2021-12-10 DIAGNOSIS — E872 Acidosis, unspecified: Secondary | ICD-10-CM | POA: Diagnosis present

## 2021-12-10 DIAGNOSIS — E1142 Type 2 diabetes mellitus with diabetic polyneuropathy: Secondary | ICD-10-CM

## 2021-12-10 DIAGNOSIS — F339 Major depressive disorder, recurrent, unspecified: Secondary | ICD-10-CM

## 2021-12-10 HISTORY — DX: Acute kidney failure, unspecified: N17.9

## 2021-12-10 HISTORY — DX: Acute pyelonephritis: N10

## 2021-12-10 LAB — CBC WITH DIFFERENTIAL/PLATELET
Abs Immature Granulocytes: 0.05 10*3/uL (ref 0.00–0.07)
Basophils Absolute: 0.1 10*3/uL (ref 0.0–0.1)
Basophils Relative: 1 %
Eosinophils Absolute: 0.1 10*3/uL (ref 0.0–0.5)
Eosinophils Relative: 1 %
HCT: 36.5 % (ref 36.0–46.0)
Hemoglobin: 11.5 g/dL — ABNORMAL LOW (ref 12.0–15.0)
Immature Granulocytes: 1 %
Lymphocytes Relative: 23 %
Lymphs Abs: 2.5 10*3/uL (ref 0.7–4.0)
MCH: 28.3 pg (ref 26.0–34.0)
MCHC: 31.5 g/dL (ref 30.0–36.0)
MCV: 89.7 fL (ref 80.0–100.0)
Monocytes Absolute: 0.6 10*3/uL (ref 0.1–1.0)
Monocytes Relative: 6 %
Neutro Abs: 7.6 10*3/uL (ref 1.7–7.7)
Neutrophils Relative %: 68 %
Platelets: 269 10*3/uL (ref 150–400)
RBC: 4.07 MIL/uL (ref 3.87–5.11)
RDW: 13.7 % (ref 11.5–15.5)
WBC: 10.9 10*3/uL — ABNORMAL HIGH (ref 4.0–10.5)
nRBC: 0 % (ref 0.0–0.2)

## 2021-12-10 LAB — COMPREHENSIVE METABOLIC PANEL
ALT: 14 U/L (ref 0–44)
AST: 18 U/L (ref 15–41)
Albumin: 4.4 g/dL (ref 3.5–5.0)
Alkaline Phosphatase: 87 U/L (ref 38–126)
Anion gap: 10 (ref 5–15)
BUN: 41 mg/dL — ABNORMAL HIGH (ref 6–20)
CO2: 20 mmol/L — ABNORMAL LOW (ref 22–32)
Calcium: 10.1 mg/dL (ref 8.9–10.3)
Chloride: 106 mmol/L (ref 98–111)
Creatinine, Ser: 2.15 mg/dL — ABNORMAL HIGH (ref 0.44–1.00)
GFR, Estimated: 26 mL/min — ABNORMAL LOW (ref >60)
Glucose, Bld: 166 mg/dL — ABNORMAL HIGH (ref 70–99)
Potassium: 4.6 mmol/L (ref 3.5–5.1)
Sodium: 136 mmol/L (ref 135–145)
Total Bilirubin: 0.3 mg/dL (ref 0.3–1.2)
Total Protein: 7.5 g/dL (ref 6.5–8.1)

## 2021-12-10 LAB — URINALYSIS, ROUTINE W REFLEX MICROSCOPIC
Glucose, UA: NEGATIVE mg/dL
Hgb urine dipstick: NEGATIVE
Ketones, ur: 5 mg/dL — AB
Nitrite: NEGATIVE
Protein, ur: 100 mg/dL — AB
Specific Gravity, Urine: 1.023 (ref 1.005–1.030)
pH: 5 (ref 5.0–8.0)

## 2021-12-10 LAB — SARS CORONAVIRUS 2 BY RT PCR: SARS Coronavirus 2 by RT PCR: NEGATIVE

## 2021-12-10 LAB — CK: Total CK: 90 U/L (ref 38–234)

## 2021-12-10 MED ORDER — GABAPENTIN 300 MG PO CAPS
300.0000 mg | ORAL_CAPSULE | Freq: Every day | ORAL | Status: DC
  Administered 2021-12-10 – 2021-12-11 (×2): 300 mg via ORAL
  Filled 2021-12-10 (×2): qty 1

## 2021-12-10 MED ORDER — PANTOPRAZOLE SODIUM 40 MG PO TBEC
40.0000 mg | DELAYED_RELEASE_TABLET | Freq: Every day | ORAL | Status: DC
  Administered 2021-12-11 – 2021-12-12 (×2): 40 mg via ORAL
  Filled 2021-12-10 (×2): qty 1

## 2021-12-10 MED ORDER — SERTRALINE HCL 50 MG PO TABS
150.0000 mg | ORAL_TABLET | Freq: Every day | ORAL | Status: DC
  Administered 2021-12-11 – 2021-12-12 (×2): 150 mg via ORAL
  Filled 2021-12-10 (×2): qty 3

## 2021-12-10 MED ORDER — PRAVASTATIN SODIUM 20 MG PO TABS
40.0000 mg | ORAL_TABLET | Freq: Every day | ORAL | Status: DC
  Administered 2021-12-11: 40 mg via ORAL
  Filled 2021-12-10: qty 2

## 2021-12-10 MED ORDER — SODIUM CHLORIDE 0.9 % IV BOLUS
1000.0000 mL | Freq: Once | INTRAVENOUS | Status: AC
  Administered 2021-12-10: 1000 mL via INTRAVENOUS

## 2021-12-10 MED ORDER — DICYCLOMINE HCL 20 MG PO TABS
20.0000 mg | ORAL_TABLET | Freq: Three times a day (TID) | ORAL | Status: DC
  Administered 2021-12-11 – 2021-12-12 (×4): 20 mg via ORAL
  Filled 2021-12-10 (×5): qty 1

## 2021-12-10 MED ORDER — MONTELUKAST SODIUM 10 MG PO TABS
10.0000 mg | ORAL_TABLET | Freq: Every day | ORAL | Status: DC
  Administered 2021-12-11: 10 mg via ORAL
  Filled 2021-12-10: qty 1

## 2021-12-10 MED ORDER — MAGNESIUM HYDROXIDE 400 MG/5ML PO SUSP: 30.0000 mL | Freq: Every day | ORAL | Status: DC | PRN

## 2021-12-10 MED ORDER — ONDANSETRON HCL 4 MG PO TABS: 4.0000 mg | ORAL_TABLET | Freq: Four times a day (QID) | ORAL | Status: DC | PRN

## 2021-12-10 MED ORDER — TRAZODONE HCL 50 MG PO TABS
25.0000 mg | ORAL_TABLET | Freq: Every evening | ORAL | Status: DC | PRN
  Administered 2021-12-11: 25 mg via ORAL
  Filled 2021-12-10: qty 1

## 2021-12-10 MED ORDER — SODIUM CHLORIDE 0.9 % IV SOLN: INTRAVENOUS | Status: DC

## 2021-12-10 MED ORDER — SODIUM CHLORIDE 0.9 % IV SOLN
2.0000 g | INTRAVENOUS | Status: DC
  Administered 2021-12-11: 2 g via INTRAVENOUS
  Filled 2021-12-10: qty 20

## 2021-12-10 MED ORDER — PIOGLITAZONE HCL 15 MG PO TABS
15.0000 mg | ORAL_TABLET | Freq: Every day | ORAL | Status: DC
  Administered 2021-12-11 – 2021-12-12 (×2): 15 mg via ORAL
  Filled 2021-12-10 (×2): qty 1

## 2021-12-10 MED ORDER — ONDANSETRON HCL 4 MG/2ML IJ SOLN: 4.0000 mg | Freq: Four times a day (QID) | INTRAMUSCULAR | Status: DC | PRN

## 2021-12-10 MED ORDER — SODIUM CHLORIDE 0.9 % IV SOLN
2.0000 g | Freq: Once | INTRAVENOUS | Status: AC
  Administered 2021-12-10: 2 g via INTRAVENOUS
  Filled 2021-12-10: qty 20

## 2021-12-10 MED ORDER — ACETAMINOPHEN 650 MG RE SUPP: 650.0000 mg | Freq: Four times a day (QID) | RECTAL | Status: DC | PRN

## 2021-12-10 MED ORDER — SODIUM CHLORIDE 0.9 % IV SOLN: 1.0000 g | INTRAVENOUS | Status: DC

## 2021-12-10 MED ORDER — ENOXAPARIN SODIUM 60 MG/0.6ML IJ SOSY
0.5000 mg/kg | PREFILLED_SYRINGE | INTRAMUSCULAR | Status: DC
  Filled 2021-12-10: qty 0.6

## 2021-12-10 MED ORDER — ACETAMINOPHEN 325 MG PO TABS
650.0000 mg | ORAL_TABLET | Freq: Four times a day (QID) | ORAL | Status: DC | PRN
  Administered 2021-12-10 – 2021-12-11 (×4): 650 mg via ORAL
  Filled 2021-12-10 (×4): qty 2

## 2021-12-10 MED ORDER — CYCLOBENZAPRINE HCL 10 MG PO TABS: 5.0000 mg | ORAL_TABLET | Freq: Every evening | ORAL | Status: DC | PRN

## 2021-12-10 NOTE — H&P (Signed)
[place]   PATIENT NAME: Betty Walters    MR#:  [id number]  DATE OF BIRTH:  [birth year]  DATE OF ADMISSION:  12/10/2021  PRIMARY CARE PHYSICIAN: Juline Patch, MD   Patient is coming from: Home  REQUESTING/REFERRING PHYSICIAN: Marjean Donna, MD  CHIEF COMPLAINT:   Chief Complaint  Patient presents with   Flank Pain    HISTORY OF PRESENT ILLNESS:  Betty Walters is a 61 y.o. Caucasian female with medical history significant for depression, GERD, type 2 diabetes mellitus, hypertension and dyslipidemia, who presented to the emergency room with acute onset of right sided flank pain that started couple of hours ago with difficulty urinating.  No nausea or vomiting.  No fever or chills.  She had diarrhea couple of days ago.  ED Course: When she came to the ER vital signs were within normal.  Labs revealed BUN of 41 with a creatinine of 2.15 up from 28/0.94 on 01/16/2021.  CBC showed leukocytosis of 10.9.  UA was positive for UTI.  Urine culture was sent.    Imaging: CT renal stone showed the following: There is no evidence of intestinal obstruction or pneumoperitoneum. There is no hydronephrosis. Lower portion of urinary bladder is below the level of pubic symphysis suggesting cystocele.  The patient was given 2 g of IV Rocephin and 1 L bolus of IV normal saline.  She will be admitted to a medical bed for further evaluation and management. PAST MEDICAL HISTORY:   Past Medical History:  Diagnosis Date   Concussion May 2016   Depression    Diabetes mellitus without complication ([place])    Family history of adverse reaction to anesthesia    Father - slow to wake   GERD (gastroesophageal reflux disease)    High cholesterol    Hypertension    Yeast infection     PAST SURGICAL HISTORY:   Past Surgical History:  Procedure Laterality Date   CESAREAN SECTION     COLONOSCOPY WITH PROPOFOL N/A 04/11/2017   Procedure: COLONOSCOPY WITH colon;  Surgeon: Lucilla Lame, MD;   Location: [facility];  Service: Endoscopy;  Laterality: N/A;   ESOPHAGOGASTRODUODENOSCOPY (EGD) WITH PROPOFOL N/A 04/11/2017   Procedure: ESOPHAGOGASTRODUODENOSCOPY (EGD) WITH PROPOFOL;  Surgeon: Lucilla Lame, MD;  Location: [facility];  Service: Endoscopy;  Laterality: N/A;  Diabetic - oral meds   GALLBLADDER SURGERY     KNEE SURGERY     MYRINGOTOMY WITH TUBE PLACEMENT Right 03/22/2020   Procedure: MYRINGOTOMY WITH TUBE PLACEMENT;  Surgeon: Carloyn Manner, MD;  Location: [facility];  Service: ENT;  Laterality: Right;  Diabetic   POLYPECTOMY  04/11/2017   Procedure: POLYPECTOMY INTESTINAL;  Surgeon: Lucilla Lame, MD;  Location: [facility];  Service: Endoscopy;;   WRIST SURGERY      SOCIAL HISTORY:   Social History   Tobacco Use   Smoking status: Never   Smokeless tobacco: Never  Substance Use Topics   Alcohol use: Yes    Alcohol/week: 0.0 standard drinks of alcohol    Comment: rare - Holidays    FAMILY HISTORY:   Family History  Problem Relation Age of Onset   Cancer Mother        breast   Diabetes Father     DRUG ALLERGIES:   Allergies  Allergen Reactions   Amoxicillin-Pot Clavulanate Nausea And Vomiting    (OK with Amoxicillin alone)    REVIEW OF SYSTEMS:   ROS As per history of present  illness. All pertinent systems were reviewed above. Constitutional, HEENT, cardiovascular, respiratory, GI, GU, musculoskeletal, neuro, psychiatric, endocrine, integumentary and hematologic systems were reviewed and are otherwise negative/unremarkable except for positive findings mentioned above in the HPI.   MEDICATIONS AT HOME:   Prior to Admission medications   Medication Sig Start Date End Date Taking? Authorizing Provider  celecoxib (CELEBREX) 200 MG capsule Take 1 capsule by mouth twice daily 07/13/21   Edrick Kins, DPM  cyclobenzaprine (FLEXERIL) 5 MG tablet Take 1 tablet (5 mg total) by mouth at bedtime as needed. 10/11/21   Juline Patch, MD  dicyclomine (BENTYL) 20 MG tablet TAKE 1 TABLET BY MOUTH THREE TIMES DAILY BEFORE MEAL(S) 06/20/21   Lucilla Lame, MD  gabapentin (NEURONTIN) 300 MG capsule Take 1 capsule by mouth at bedtime 09/06/21   Edrick Kins, DPM  Insulin Pen Needle (PEN NEEDLES 3/16") 31G X 5 MM MISC 1 each by Does not apply route daily. 06/01/19   Juline Patch, MD  liraglutide (VICTOZA) 18 MG/3ML SOPN INJECT 1.8 MG INTO THE SKIN ONCE DAILY. 10/11/21   Juline Patch, MD  lisinopril (ZESTRIL) 20 MG tablet Take 1 tablet (20 mg total) by mouth daily. 10/11/21   Juline Patch, MD  lovastatin (MEVACOR) 40 MG tablet Take 1 tablet dailyTAKE 1 TABLET BY MOUTH ONCE DAILY. 10/11/21   Juline Patch, MD  metFORMIN (GLUCOPHAGE) 1000 MG tablet Take 1 tablet (1,000 mg total) by mouth 2 (two) times daily. 10/11/21   Juline Patch, MD  montelukast (SINGULAIR) 10 MG tablet Take 1 tablet (10 mg total) by mouth at bedtime. 10/11/21   Juline Patch, MD  pantoprazole (PROTONIX) 40 MG tablet Take 1 tablet (40 mg total) by mouth daily. 06/20/21   Lucilla Lame, MD  pioglitazone (ACTOS) 15 MG tablet Take 1 tablet daily 10/11/21   Juline Patch, MD  sertraline (ZOLOFT) 100 MG tablet TAKE 1 TABLET BY MOUTH ONCE DAILY TAKE ALONG WITH '50MG'$  TABLET FOR A TOTAL OF '150MG'$  10/11/21   Juline Patch, MD  sertraline (ZOLOFT) 50 MG tablet TAKE 1 TABLET BY MOUTH ONCE DAILY ALONG WITH 100 MG TABLET FOR A TOTAL DOSE OF '150MG'$ . APPT NEEDED BEFORE 30 DAYS ARE UP 10/11/21   Juline Patch, MD      VITAL SIGNS:  Blood pressure 122/60, pulse 99, temperature 98.2 F (36.8 C), temperature source Oral, resp. rate 15, weight 105.7 kg, SpO2 99 %.  PHYSICAL EXAMINATION:  Physical Exam  GENERAL:  61 y.o.-year-old Caucasian female patient lying in the bed with no acute distress.  EYES: Pupils equal, round, reactive to light and accommodation. No scleral icterus. Extraocular muscles intact.  HEENT: Head atraumatic, normocephalic. Oropharynx and  nasopharynx clear.  NECK:  Supple, no jugular venous distention. No thyroid enlargement, no tenderness.  LUNGS: Normal breath sounds bilaterally, no wheezing, rales,rhonchi or crepitation. No use of accessory muscles of respiration.  CARDIOVASCULAR: Regular rate and rhythm, S1, S2 normal. No murmurs, rubs, or gallops.  ABDOMEN: Soft, nondistended, nontender. Bowel sounds present. No organomegaly or mass.  Positive right CVA tenderness. EXTREMITIES: No pedal edema, cyanosis, or clubbing.  NEUROLOGIC: Cranial nerves II through XII are intact. Muscle strength 5/5 in all extremities. Sensation intact. Gait not checked.  PSYCHIATRIC: The patient is alert and oriented x 3.  Normal affect and good eye contact. SKIN: No obvious rash, lesion, or ulcer.   LABORATORY PANEL:   CBC Recent Labs  Lab 12/10/21 1759  WBC 10.9*  HGB 11.5*  HCT 36.5  PLT 269   ------------------------------------------------------------------------------------------------------------------  Chemistries  Recent Labs  Lab 12/10/21 1759  NA 136  K 4.6  CL 106  CO2 20*  GLUCOSE 166*  BUN 41*  CREATININE 2.15*  CALCIUM 10.1  AST 18  ALT 14  ALKPHOS 87  BILITOT 0.3   ------------------------------------------------------------------------------------------------------------------  Cardiac Enzymes No results for input(s): "TROPONINI" in the last 168 hours. ------------------------------------------------------------------------------------------------------------------  RADIOLOGY:  CT RENAL STONE STUDY  Result Date: 12/10/2021 CLINICAL DATA:  Right flank pain EXAM: CT ABDOMEN AND PELVIS WITHOUT CONTRAST TECHNIQUE: Multidetector CT imaging of the abdomen and pelvis was performed following the standard protocol without IV contrast. RADIATION DOSE REDUCTION: This exam was performed according to the departmental dose-optimization program which includes automated exposure control, adjustment of the mA and/or kV  according to patient size and/or use of iterative reconstruction technique. COMPARISON:  02/06/2017 FINDINGS: Lower chest: Unremarkable. Hepatobiliary: No focal abnormalities are seen in liver. There is no dilation of bile ducts. Surgical clips are seen in gallbladder fossa. Pancreas: No focal abnormalities are seen. Spleen: Unremarkable. Adrenals/Urinary Tract: Adrenals are unremarkable. There is no hydronephrosis. There are no renal or ureteral stones. Urinary bladder is not distended. Lower portion of the bladder is projecting below the level of pubic symphysis suggesting cystocele. Stomach/Bowel: Stomach is unremarkable. Small bowel loops are not dilated. The appendix is not seen. There is no pericecal inflammation. There is no significant wall thickening and colon. There is no pericolic stranding. Vascular/Lymphatic: There are small scattered arterial calcifications. Reproductive: Unremarkable. Other: There is no ascites or pneumoperitoneum. Small umbilical hernia containing fat is seen. Musculoskeletal: Degenerative changes are noted in the lumbar spine, more so at L5-S1 level. IMPRESSION: There is no evidence of intestinal obstruction or pneumoperitoneum. There is no hydronephrosis. Lower portion of urinary bladder is below the level of pubic symphysis suggesting cystocele. Other findings as described in the body of the report. Electronically Signed   By: Elmer Picker M.D.   On: 12/10/2021 18:39      IMPRESSION AND PLAN:  Assessment and Plan: * Acute pyelonephritis - The patient will be admitted to a medical bed. - We will continue antibiotic therapy with IV Rocephin. - We will follow urine culture and sensitivity. - Pain management will be provided.  AKI (acute kidney injury) ([place]) - The patient will be admitted with IV normal saline. - We will follow BMPs. - We will avoid nephrotoxins.  GERD without esophagitis - We will continue PPI therapy.  Depression - We will continue  Zoloft.  Dyslipidemia - We will continue statin therapy.  Type 2 diabetes mellitus with peripheral neuropathy (HCC) - The patient will be placed on supplement coverage with NovoLog. - We will continue Actos and Neurontin and hold off Glucophage.    DVT prophylaxis: Lovenox.  Advanced Care Planning:  Code Status: full code.  Family Communication:  The plan of care was discussed in details with the patient (and family). I answered all questions. The patient agreed to proceed with the above mentioned plan. Further management will depend upon hospital course. Disposition Plan: Back to previous home environment Consults called: none.  All the records are reviewed and case discussed with ED provider.  Status is: Inpatient    At the time of the admission, it appears that the appropriate admission status for this patient is inpatient.  This is judged to be reasonable and necessary in order to provide the required intensity of service to ensure the patient's safety given  the presenting symptoms, physical exam findings and initial radiographic and laboratory data in the context of comorbid conditions.  The patient requires inpatient status due to high intensity of service, high risk of further deterioration and high frequency of surveillance required.  I certify that at the time of admission, it is my clinical judgment that the patient will require inpatient hospital care extending more than 2 midnights.                            Dispo: The patient is from: Home              Anticipated d/c is to: Home              Patient currently is not medically stable to d/c.              Difficult to place patient: No  Christel Mormon M.D on 12/10/2021 at 8:19 PM  [facility]   From 7 PM-7 AM, contact night-coverage [email or web address]  CC: Primary care physician; Juline Patch, MD

## 2021-12-10 NOTE — Assessment & Plan Note (Signed)
>>  ASSESSMENT AND PLAN FOR DYSLIPIDEMIA WRITTEN ON 12/10/2021  8:18 PM BY MANSY, JAN A, MD  - We will continue statin therapy.

## 2021-12-10 NOTE — Assessment & Plan Note (Signed)
Most likely secondary to poor p.o. intake, creatinine improving. -Continue with IV fluid-switched to LR due to elevated chloride -Monitor renal function -Avoid nephrotoxins

## 2021-12-10 NOTE — Telephone Encounter (Signed)
Chief Complaint: Urinary retention, unable to emply bladder fully Symptoms: Burning with urination, back pain 6-7, dribbling when urinated, bladder feels full Frequency: Onset yesterday, pain today Pertinent Negatives: Patient denies other syptoms Disposition: '[x]'$ ED /'[]'$ Urgent Care (no appt availability in office) / '[]'$ Appointment(In office/virtual)/ '[]'$  [facility]/ '[]'$ Home Care/ '[]'$ Refused Recommended Disposition /'[]'$ [facility]/ '[]'$  Follow-up with PCP Additional Notes: Advised ED for evaluation, she says she was hoping to be called in something by Dr. Ronnald Ramp. Advised she will need an evaluation and urine sample, with the inability to urinate, bladder feeling full, flank pain she will need to be evaluated today in the ED. She says she'll go to [facility] or [facility] ED.     Summary: not urinating alot, side pain   Pt called in with possible UTI symptoms. I advised pt of next opening with PCP tomorrow morning at [facility]. Pt says that she's not sure if she can wait til then. Pt says that she is experiencing side pain and has the urge to urinate but can put out a lot. Pt says that she has been drinking water today. Pt says that she started with symptoms this morning.      Reason for Disposition  [1] Unable to urinate (or only a few drops) > 4 hours AND [2] bladder feels very full (e.g., palpable bladder or strong urge to urinate)  Answer Assessment - Initial Assessment Questions 1. SYMPTOM: "What's the main symptom you're concerned about?" (e.g., frequency, incontinence)     Unable to urinate fully 2. ONSET: "When did the symptoms start?"     Noticed yesterday not emptying bladder good, this morning dribbling, pain started today in back 3. PAIN: "Is there any pain?" If Yes, ask: "How bad is it?" (Scale: 1-10; mild, moderate, severe)     6-7 4. CAUSE: "What do you think is causing the symptoms?"     UTI 5. OTHER SYMPTOMS: "Do you have any other symptoms?" (e.g., blood in urine,  fever, flank pain, pain with urination)     Flank pain, burning with urination 6. PREGNANCY: "Is there any chance you are pregnant?" "When was your last menstrual period?"     No  Protocols used: Urinary Symptoms-A-AH

## 2021-12-10 NOTE — ED Provider Notes (Signed)
[facility] Provider Note    Event Date/Time   First MD Initiated Contact with Patient 12/10/21 1801     (approximate)   History   Flank Pain   HPI  Betty Walters is a 61 y.o. female who comes in with right-sided flank pain that started couple of hours ago's with difficulty urinating.  Patient reports feeling like she might have a UTI.  Reports pain going up into her right kidney.  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  She reports having significantly decrease in urination which is what made her concerned and come to the ER to be evaluated.   Physical Exam   Triage Vital Signs: ED Triage Vitals [12/10/21 1756]  Enc Vitals Group     BP 122/60     Pulse Rate 99     Resp 15     Temp 98.2 F (36.8 C)     Temp Source Oral     SpO2 99 %     Weight 233 lb (105.7 kg)     Height      Head Circumference      Peak Flow      Pain Score 7     Pain Loc      Pain Edu?      Excl. in [place]?     Most recent vital signs: Vitals:   12/10/21 1756  BP: 122/60  Pulse: 99  Resp: 15  Temp: 98.2 F (36.8 C)  SpO2: 99%     General: Awake, no distress.  CV:  Good peripheral perfusion.  Resp:  Normal effort.  Abd:  No distention.  Other:  Right flank tenderness   ED Results / Procedures / Treatments   Labs (all labs ordered are listed, but only abnormal results are displayed) Labs Reviewed  URINALYSIS, ROUTINE W REFLEX MICROSCOPIC - Abnormal; Notable for the following components:      Result Value   Color, Urine AMBER (*)    APPearance CLOUDY (*)    Bilirubin Urine SMALL (*)    Ketones, ur 5 (*)    Protein, ur 100 (*)    Leukocytes,Ua SMALL (*)    Bacteria, UA MANY (*)    All other components within normal limits  COMPREHENSIVE METABOLIC PANEL - Abnormal; Notable for the following components:   CO2 20 (*)    Glucose, Bld 166 (*)    BUN 41 (*)    Creatinine, Ser 2.15 (*)    GFR,  Estimated 26 (*)    All other components within normal limits  CBC WITH DIFFERENTIAL/PLATELET - Abnormal; Notable for the following components:   WBC 10.9 (*)    Hemoglobin 11.5 (*)    All other components within normal limits    :    RADIOLOGY I have reviewed the CT personally and interpreted no evidence of kidney stone   PROCEDURES:  Critical Care performed: No  Procedures   MEDICATIONS ORDERED IN ED: Medications - No data to display   IMPRESSION / MDM / [place] / ED COURSE  I reviewed the triage vital signs and the nursing notes.   Patient's presentation is most consistent with acute presentation with potential threat to life or bodily function.   Differential includes pyelonephritis, UTI, appendicitis, kidney stone.  CBC shows slightly elevated white count.  CMP shows significantly elevated creatinine of 2.15 we will give some IV fluids.  Her urine  has many bacteria in it with some whites  Patient does have cystocele noted on CT scan by reviewed the CT scan from 3 years prior and she had similar.  Her pain is very significant right now but she is had a significant bump in her creatinine.  We discussed patient needing to hold some of her medications due to the AKI and going home with recheck of creatinine outpatient versus coming to the hospital for IV antibiotics IV fluids and recheck in the morning.  At this point time patient would prefer for admission which I think is very reasonable given her comorbidities and AKI    FINAL CLINICAL IMPRESSION(S) / ED DIAGNOSES   Final diagnoses:  AKI (acute kidney injury) ([place])  Pyelonephritis     Rx / DC Orders   ED Discharge Orders     None        Note:  This document was prepared using Dragon voice recognition software and may include unintentional dictation errors.   Vanessa Virgie, MD 12/10/21 [phone]

## 2021-12-10 NOTE — ED Triage Notes (Signed)
Pt arrives with c/o right sided flank pain that started a couple of hours ago. Pt endorses difficulty urinating and painful urination.

## 2021-12-10 NOTE — Assessment & Plan Note (Signed)
Patient was on metformin and Actos at home. Recent A1c done in July 2023 was 7.4 CBG mildly elevated. -Semglee 5 units twice daily -Moderate scale SSI

## 2021-12-10 NOTE — Progress Notes (Signed)
PHARMACIST - PHYSICIAN COMMUNICATION  CONCERNING:  Enoxaparin (Lovenox) for DVT Prophylaxis    RECOMMENDATION: Patient was prescribed enoxaparin '40mg'$  q24 hours for VTE prophylaxis.   Filed Weights   12/10/21 1756  Weight: 105.7 kg (233 lb)    Body mass index is 35.43 kg/m.  Estimated Creatinine Clearance: 36.3 mL/min (A) (by C-G formula based on SCr of 2.15 mg/dL (H)).  Based on [place] patient is candidate for enoxaparin 0.'5mg'$ /kg TBW SQ every 24 hours based on BMI being >30.  DESCRIPTION: Pharmacy has adjusted enoxaparin dose per [facility] policy.  Patient is now receiving enoxaparin 52.5 mg every 24 hours   Benita Gutter 12/10/2021 7:45 PM

## 2021-12-10 NOTE — Assessment & Plan Note (Signed)
-   We will continue statin therapy. 

## 2021-12-10 NOTE — Assessment & Plan Note (Signed)
-   We will continue Zoloft 

## 2021-12-10 NOTE — Assessment & Plan Note (Signed)
Clinical suspicion of pyelonephritis with no positive findings on imaging. Pending urinary cultures. -Continue with ceftriaxone -Follow-up urine cultures

## 2021-12-10 NOTE — Assessment & Plan Note (Signed)
-   We will continue PPI therapy 

## 2021-12-11 ENCOUNTER — Ambulatory Visit: Payer: BC Managed Care – PPO | Admitting: Podiatry

## 2021-12-11 DIAGNOSIS — N10 Acute tubulo-interstitial nephritis: Secondary | ICD-10-CM | POA: Diagnosis not present

## 2021-12-11 LAB — BASIC METABOLIC PANEL
Anion gap: 6 (ref 5–15)
BUN: 41 mg/dL — ABNORMAL HIGH (ref 6–20)
CO2: 20 mmol/L — ABNORMAL LOW (ref 22–32)
Calcium: 9 mg/dL (ref 8.9–10.3)
Chloride: 114 mmol/L — ABNORMAL HIGH (ref 98–111)
Creatinine, Ser: 1.3 mg/dL — ABNORMAL HIGH (ref 0.44–1.00)
GFR, Estimated: 48 mL/min — ABNORMAL LOW (ref >60)
Glucose, Bld: 153 mg/dL — ABNORMAL HIGH (ref 70–99)
Potassium: 4.2 mmol/L (ref 3.5–5.1)
Sodium: 140 mmol/L (ref 135–145)

## 2021-12-11 LAB — GLUCOSE, CAPILLARY
Glucose-Capillary: 129 mg/dL — ABNORMAL HIGH (ref 70–99)
Glucose-Capillary: 196 mg/dL — ABNORMAL HIGH (ref 70–99)
Glucose-Capillary: 157 mg/dL — ABNORMAL HIGH (ref 70–99)
Glucose-Capillary: 129 mg/dL — ABNORMAL HIGH (ref 70–99)

## 2021-12-11 LAB — URINE CULTURE: Culture: <10000 — AB

## 2021-12-11 LAB — CBC
HCT: 30.8 % — ABNORMAL LOW (ref 36.0–46.0)
Hemoglobin: 9.8 g/dL — ABNORMAL LOW (ref 12.0–15.0)
MCH: 28.2 pg (ref 26.0–34.0)
MCHC: 31.8 g/dL (ref 30.0–36.0)
MCV: 88.8 fL (ref 80.0–100.0)
Platelets: 207 10*3/uL (ref 150–400)
RBC: 3.47 MIL/uL — ABNORMAL LOW (ref 3.87–5.11)
RDW: 13.9 % (ref 11.5–15.5)
WBC: 5.9 10*3/uL (ref 4.0–10.5)
nRBC: 0 % (ref 0.0–0.2)

## 2021-12-11 MED ORDER — ARTIFICIAL TEARS OPHTHALMIC OINT
TOPICAL_OINTMENT | OPHTHALMIC | Status: DC | PRN
  Filled 2021-12-11 (×2): qty 3.5

## 2021-12-11 MED ORDER — INSULIN GLARGINE-YFGN 100 UNIT/ML ~~LOC~~ SOLN
5.0000 [IU] | Freq: Two times a day (BID) | SUBCUTANEOUS | Status: DC
  Administered 2021-12-11 – 2021-12-12 (×3): 5 [IU] via SUBCUTANEOUS
  Filled 2021-12-11 (×3): qty 0.05

## 2021-12-11 MED ORDER — INSULIN ASPART 100 UNIT/ML IJ SOLN
0.0000 [IU] | Freq: Three times a day (TID) | INTRAMUSCULAR | Status: DC
  Administered 2021-12-11 (×2): 3 [IU] via SUBCUTANEOUS
  Administered 2021-12-12: 2 [IU] via SUBCUTANEOUS
  Filled 2021-12-11 (×3): qty 1

## 2021-12-11 MED ORDER — INSULIN ASPART 100 UNIT/ML IJ SOLN: 0.0000 [IU] | Freq: Every day | INTRAMUSCULAR | Status: DC

## 2021-12-11 MED ORDER — LACTATED RINGERS IV SOLN: INTRAVENOUS | Status: AC

## 2021-12-11 NOTE — Progress Notes (Signed)
Progress Note   Patient: Betty Walters GUY:[id number] DOB: [birth year] DOA: 12/10/2021     1 DOS: the patient was seen and examined on 12/11/2021   Brief hospital course: Taken from H&P.  Betty Walters is a 61 y.o. Caucasian female with medical history significant for depression, GERD, type 2 diabetes mellitus, hypertension and dyslipidemia, who presented to the emergency room with acute onset of right sided flank pain that started couple of hours ago with difficulty urinating.  No nausea or vomiting.  No fever or chills.  She had diarrhea couple of days ago.   ED Course: When she came to the ER vital signs were within normal.  Labs revealed BUN of 41 with a creatinine of 2.15 up from 28/0.94 on 01/16/2021.  CBC showed leukocytosis of 10.9.  UA was positive for UTI.  Urine culture was sent.     Imaging: CT renal stone showed the following: There is no evidence of intestinal obstruction or pneumoperitoneum. There is no hydronephrosis. Lower portion of urinary bladder is below the level of pubic symphysis suggesting cystocele.   The patient was given 2 g of IV Rocephin and 1 L bolus of IV normal saline.    9/12: Hemodynamically stable.  Leukocytosis resolved, all cell lines seems decreased most likely dilutional effect with IV fluid. Creatinine improved to 1.30 from 2.15.  Baseline below 1. Continue to have mild none anion gap metabolic acidosis, IV fluids switched to LR for 10 more hours. Urine cultures pending.   Assessment and Plan: * Acute pyelonephritis Clinical suspicion of pyelonephritis with no positive findings on imaging. Pending urinary cultures. -Continue with ceftriaxone -Follow-up urine cultures  AKI (acute kidney injury) (Betty Walters) Most likely secondary to poor p.o. intake, creatinine improving. -Continue with IV fluid-switched to LR due to elevated chloride -Monitor renal function -Avoid nephrotoxins  Type 2 diabetes mellitus with peripheral neuropathy (Betty Walters) Patient  was on metformin and Actos at home. Recent A1c done in July 2023 was 7.4 CBG mildly elevated. -Semglee 5 units twice daily -Moderate scale SSI  Dyslipidemia - We will continue statin therapy.  Depression - We will continue Zoloft.  GERD without esophagitis - We will continue PPI therapy.   Subjective: Pain seems controlled with meds.  No new complaint.  Physical Exam: Vitals:   12/10/21 2105 12/11/21 0437 12/11/21 0803 12/11/21 1546  BP: (!) 137/54 (!) 104/48 (!) 96/49 (!) 112/54  Pulse: 81 70 70 76  Resp: '20 18  18  '$ Temp: 98.5 F (36.9 C) 97.7 F (36.5 C) 97.8 F (36.6 C) 97.9 F (36.6 C)  TempSrc: Oral Oral Oral Oral  SpO2: 100% 99% 99% 100%  Weight:       General.  Obese lady, in no acute distress. Pulmonary.  Lungs clear bilaterally, normal respiratory effort. CV.  Regular rate and rhythm, no JVD, rub or murmur. Abdomen.  Soft, mild R CVA tenderness, nondistended, BS positive. CNS.  Alert and oriented .  No focal neurologic deficit. Extremities.  No edema, no cyanosis, pulses intact and symmetrical. Psychiatry.  Judgment and insight appears normal.  Data Reviewed: Prior data reviewed  Family Communication: Discussed with patient  Disposition: Status is: Inpatient Remains inpatient appropriate because: Severity of illness   Planned Discharge Destination: Home  Time spent: 45 minutes  This record has been created using Systems analyst. Errors have been sought and corrected,but may not always be located. Such creation errors do not reflect on the standard of care.  Author: Lorella Nimrod, MD  12/11/2021 4:30 PM  For on call review [email or web address].

## 2021-12-11 NOTE — TOC Initial Note (Signed)
Transition of Care [facility]) - Initial/Assessment Note    Patient Details  Name: Betty Walters MRN: [id number] Date of Birth: 07/31/1960  Transition of Care [facility]) CM/SW Contact:    Beverly Sessions, RN Phone Number: 12/11/2021, 1:54 PM  Clinical Narrative:                   Transition of Care (TOC) Screening Note   Patient Details  Name: Betty Walters Date of Birth: 06-Jun-1960   Transition of Care [facility]) CM/SW Contact:    Beverly Sessions, RN Phone Number: 12/11/2021, 1:54 PM    Transition of Care Department [facility]) has reviewed patient and no TOC needs have been identified at this time. We will continue to monitor patient advancement through interdisciplinary progression rounds. If new patient transition needs arise, please place a TOC consult.         Patient Goals and CMS Choice        Expected Discharge Plan and Services                                                Prior Living Arrangements/Services                       Activities of Daily Living Home Assistive Devices/Equipment: None ADL Screening (condition at time of admission) Patient's cognitive ability adequate to safely complete daily activities?: Yes Is the patient deaf or have difficulty hearing?: No Does the patient have difficulty seeing, even when wearing glasses/contacts?: No Does the patient have difficulty concentrating, remembering, or making decisions?: No Patient able to express need for assistance with ADLs?: Yes Does the patient have difficulty dressing or bathing?: No Independently performs ADLs?: Yes (appropriate for developmental age) Does the patient have difficulty walking or climbing stairs?: No Weakness of Legs: Both Weakness of Arms/Hands: None  Permission Sought/Granted                  Emotional Assessment              Admission diagnosis:  Pyelonephritis [N12] AKI (acute kidney injury) ([place]) [N17.9] Acute pyelonephritis  [N10] Patient Active Problem List   Diagnosis Date Noted   Acute pyelonephritis 12/10/2021   AKI (acute kidney injury) ([place]) 12/10/2021   Type 2 diabetes mellitus with peripheral neuropathy ([place]) 12/10/2021   Dyslipidemia 12/10/2021   Depression 12/10/2021   GERD without esophagitis 12/10/2021   Sprain, metatarsophalangeal joint 01/26/2018   Heartburn    Dyspepsia    Change in bowel habits    Benign neoplasm of descending colon    Chronic pain of toes of both feet 09/20/2016   Type 2 diabetes mellitus with complication ([place]) 85/46/2703   Allergic rhinitis due to pollen 06/21/2015   Concussion with coma 10/21/2014   Clinical depression 09/28/2014   Diabetes mellitus, type 2 ([place]) 09/28/2014   HLD (hyperlipidemia) 09/28/2014   Essential hypertension 09/28/2014   Adiposity 09/28/2014   PCP:  Juline Patch, MD Pharmacy:   [facility] [address] ([address]), [place] [address] [place]) [place] [address] Phone: [phone] Fax: [phone]     Social Determinants of Health (SDOH) Interventions    Readmission Risk Interventions     No data to display

## 2021-12-11 NOTE — Hospital Course (Addendum)
Taken from H&P.  Betty Walters is a 61 y.o. Caucasian female with medical history significant for depression, GERD, type 2 diabetes mellitus, hypertension and dyslipidemia, who presented to the emergency room with acute onset of right sided flank pain that started couple of hours ago with difficulty urinating.  No nausea or vomiting.  No fever or chills.  She had diarrhea couple of days ago.   ED Course: When she came to the ER vital signs were within normal.  Labs revealed BUN of 41 with a creatinine of 2.15 up from 28/0.94 on 01/16/2021.  CBC showed leukocytosis of 10.9.  UA was positive for UTI.  Urine culture was sent.     Imaging: CT renal stone showed the following: There is no evidence of intestinal obstruction or pneumoperitoneum. There is no hydronephrosis. Lower portion of urinary bladder is below the level of pubic symphysis suggesting cystocele.   The patient was given 2 g of IV Rocephin and 1 L bolus of IV normal saline.    9/12: Hemodynamically stable.  Leukocytosis resolved, all cell lines seems decreased most likely dilutional effect with IV fluid. Creatinine improved to 1.30 from 2.15.  Baseline below 1. Continue to have mild none anion gap metabolic acidosis, IV fluids switched to LR for 10 more hours. Urine cultures pending.

## 2021-12-12 DIAGNOSIS — N10 Acute tubulo-interstitial nephritis: Secondary | ICD-10-CM | POA: Diagnosis not present

## 2021-12-12 LAB — BASIC METABOLIC PANEL
Anion gap: 7 (ref 5–15)
BUN: 25 mg/dL — ABNORMAL HIGH (ref 6–20)
CO2: 20 mmol/L — ABNORMAL LOW (ref 22–32)
Calcium: 9.1 mg/dL (ref 8.9–10.3)
Chloride: 114 mmol/L — ABNORMAL HIGH (ref 98–111)
Creatinine, Ser: 0.81 mg/dL (ref 0.44–1.00)
GFR, Estimated: >60 mL/min (ref >60)
Glucose, Bld: 161 mg/dL — ABNORMAL HIGH (ref 70–99)
Potassium: 4.8 mmol/L (ref 3.5–5.1)
Sodium: 141 mmol/L (ref 135–145)

## 2021-12-12 LAB — GLUCOSE, CAPILLARY: Glucose-Capillary: 135 mg/dL — ABNORMAL HIGH (ref 70–99)

## 2021-12-12 LAB — HIV ANTIBODY (ROUTINE TESTING W REFLEX): HIV Screen 4th Generation wRfx: NONREACTIVE

## 2021-12-12 MED ORDER — CEFDINIR 300 MG PO CAPS: 300.0000 mg | ORAL_CAPSULE | Freq: Two times a day (BID) | ORAL | 0 refills | Status: AC

## 2021-12-12 NOTE — Discharge Summary (Signed)
Physician Discharge Summary  Betty Walters:[id number] DOB: Jul 12, 1960 DOA: 12/10/2021  PCP: Juline Patch, MD  Admit date: 12/10/2021 Discharge date: 12/12/2021  Admitted From: Home Disposition:  Home  Discharge Condition:Stable CODE STATUS:FULL Diet recommendation: Heart Healthy   Brief/Interim Summary: Betty Walters is a 61 y.o. Caucasian female with medical history significant for depression, GERD, type 2 diabetes mellitus, hypertension and dyslipidemia, who presented to the emergency room with acute onset of right sided flank pain .  No history of fever or chills.  On presentation ,she was hemodynamically stable.  Lab work showed creatinine of 2.1, baseline creatinine normal.  UA was positive for UTI, urine culture sent.  CT renal study did not show any evidence of hydronephrosis.  Patient was suspected to have pyeloonephritis, started on ceftriaxone.  Urine culture, blood culture sent.  Urine culture showed insignificant growth.  Blood cultures have not shown any growth.  Patient is hemodynamically stable without any complaints.  AKI completely resolved.  Medically stable for discharge to home today with oral antibiotics.  Following problems were addressed during her hospitalization:  Acute pyelonephritis Clinical suspicion of pyelonephritis with no positive findings on imaging. Urine culture without significant growth.  Symptoms have resolved.Abx changed to oral   AKI  Resolved with IV fluids  Type 2 diabetes mellitus with peripheral neuropathy ([place]) Patient was on metformin and Actos at home. Recent A1c done in July 2023 was 7.4 Continue home regimen   Dyslipidemia We will continue statin therapy.   Depression  We will continue Zoloft.   GERD without esophagitis We will continue PPI therapy.     Discharge Diagnoses:  Principal Problem:   Acute pyelonephritis Active Problems:   AKI (acute kidney injury) ([place])   Type 2 diabetes mellitus with peripheral  neuropathy ([facility])   Dyslipidemia   Depression   GERD without esophagitis    Discharge Instructions  Discharge Instructions     Diet - low sodium heart healthy   Complete by: As directed    Discharge instructions   Complete by: As directed    1)Please take prescribed medications as instructed 2)Follow up with your PCP in a week   Increase activity slowly   Complete by: As directed       Allergies as of 12/12/2021       Reactions   Amoxicillin-pot Clavulanate Nausea And Vomiting   (OK with Amoxicillin alone)        Medication List     STOP taking these medications    celecoxib 200 MG capsule Commonly known as: CELEBREX       TAKE these medications    cefdinir 300 MG capsule Commonly known as: OMNICEF Take 1 capsule (300 mg total) by mouth 2 (two) times daily for 5 days.   cyclobenzaprine 5 MG tablet Commonly known as: FLEXERIL Take 1 tablet (5 mg total) by mouth at bedtime as needed.   dicyclomine 20 MG tablet Commonly known as: BENTYL TAKE 1 TABLET BY MOUTH THREE TIMES DAILY BEFORE MEAL(S)   gabapentin 300 MG capsule Commonly known as: NEURONTIN Take 1 capsule by mouth at bedtime   lisinopril 20 MG tablet Commonly known as: ZESTRIL Take 1 tablet (20 mg total) by mouth daily.   lovastatin 40 MG tablet Commonly known as: MEVACOR Take 1 tablet dailyTAKE 1 TABLET BY MOUTH ONCE DAILY.   metFORMIN 1000 MG tablet Commonly known as: GLUCOPHAGE Take 1 tablet (1,000 mg total) by mouth 2 (two) times daily.   montelukast 10 MG  tablet Commonly known as: SINGULAIR Take 1 tablet (10 mg total) by mouth at bedtime.   pantoprazole 40 MG tablet Commonly known as: PROTONIX Take 1 tablet (40 mg total) by mouth daily.   Pen Needles 3/16" 31G X 5 MM Misc 1 each by Does not apply route daily.   pioglitazone 15 MG tablet Commonly known as: ACTOS Take 1 tablet daily   sertraline 100 MG tablet Commonly known as: ZOLOFT TAKE 1 TABLET BY MOUTH ONCE DAILY TAKE  ALONG WITH '50MG'$  TABLET FOR A TOTAL OF '150MG'$    sertraline 50 MG tablet Commonly known as: ZOLOFT TAKE 1 TABLET BY MOUTH ONCE DAILY ALONG WITH 100 MG TABLET FOR A TOTAL DOSE OF '150MG'$ . APPT NEEDED BEFORE 30 DAYS ARE UP   Victoza 18 MG/3ML Sopn Generic drug: liraglutide INJECT 1.8 MG INTO THE SKIN ONCE DAILY.        Follow-up Information     Juline Patch, MD. Schedule an appointment as soon as possible for a visit in 1 week(s).   Specialty: Family Medicine Contact information: [place] [address] [phone]                Allergies  Allergen Reactions   Amoxicillin-Pot Clavulanate Nausea And Vomiting    (OK with Amoxicillin alone)    Consultations: None   Procedures/Studies: CT RENAL STONE STUDY  Result Date: 12/10/2021 CLINICAL DATA:  Right flank pain EXAM: CT ABDOMEN AND PELVIS WITHOUT CONTRAST TECHNIQUE: Multidetector CT imaging of the abdomen and pelvis was performed following the standard protocol without IV contrast. RADIATION DOSE REDUCTION: This exam was performed according to the departmental dose-optimization program which includes automated exposure control, adjustment of the mA and/or kV according to patient size and/or use of iterative reconstruction technique. COMPARISON:  02/06/2017 FINDINGS: Lower chest: Unremarkable. Hepatobiliary: No focal abnormalities are seen in liver. There is no dilation of bile ducts. Surgical clips are seen in gallbladder fossa. Pancreas: No focal abnormalities are seen. Spleen: Unremarkable. Adrenals/Urinary Tract: Adrenals are unremarkable. There is no hydronephrosis. There are no renal or ureteral stones. Urinary bladder is not distended. Lower portion of the bladder is projecting below the level of pubic symphysis suggesting cystocele. Stomach/Bowel: Stomach is unremarkable. Small bowel loops are not dilated. The appendix is not seen. There is no pericecal inflammation. There is no significant wall thickening  and colon. There is no pericolic stranding. Vascular/Lymphatic: There are small scattered arterial calcifications. Reproductive: Unremarkable. Other: There is no ascites or pneumoperitoneum. Small umbilical hernia containing fat is seen. Musculoskeletal: Degenerative changes are noted in the lumbar spine, more so at L5-S1 level. IMPRESSION: There is no evidence of intestinal obstruction or pneumoperitoneum. There is no hydronephrosis. Lower portion of urinary bladder is below the level of pubic symphysis suggesting cystocele. Other findings as described in the body of the report. Electronically Signed   By: Elmer Picker M.D.   On: 12/10/2021 18:39      Subjective: Patient seen and examined at the bedside today.  Hemodynamically stable for discharge   Discharge Exam: Vitals:   12/12/21 0519 12/12/21 0800  BP: (!) 109/55 124/62  Pulse: 65 65  Resp: 18 18  Temp: 97.8 F (36.6 C) 97.8 F (36.6 C)  SpO2: 100% 99%   Vitals:   12/11/21 1546 12/11/21 2012 12/12/21 0519 12/12/21 0800  BP: (!) 112/54 (!) 109/50 (!) 109/55 124/62  Pulse: 76 71 65 65  Resp: '18 18 18 18  '$ Temp: 97.9 F (36.6 C) 98.2 F (36.8  C) 97.8 F (36.6 C) 97.8 F (36.6 C)  TempSrc: Oral Oral Oral Oral  SpO2: 100% 96% 100% 99%  Weight:        General: Pt is alert, awake, not in acute distress Cardiovascular: RRR, S1/S2 +, no rubs, no gallops Respiratory: CTA bilaterally, no wheezing, no rhonchi Abdominal: Soft, NT, ND, bowel sounds + Extremities: no edema, no cyanosis    The results of significant diagnostics from this hospitalization (including imaging, microbiology, ancillary and laboratory) are listed below for reference.     Microbiology: Recent Results (from the past 240 hour(s))  Urine Culture     Status: Abnormal   Collection Time: 12/10/21  7:05 PM   Specimen: Urine, Random  Result Value Ref Range Status   Specimen Description   Final    URINE, RANDOM Performed at [facility], [address] [place], [place] [address]    Special Requests   Final    NONE Performed at [facility], [address], [place], [place] [address]    Culture (A)  Final    <10,000 COLONIES/mL INSIGNIFICANT GROWTH Performed at [facility] [address] [place], [place] [address]    Report Status 12/11/2021 FINAL  Final  SARS Coronavirus 2 by RT PCR (hospital order, performed in [facility] hospital lab) *cepheid single result test* Anterior Nasal Swab     Status: None   Collection Time: 12/10/21  7:24 PM   Specimen: Anterior Nasal Swab  Result Value Ref Range Status   SARS Coronavirus 2 by RT PCR NEGATIVE NEGATIVE Final    Comment: (NOTE) SARS-CoV-2 target nucleic acids are NOT DETECTED.  The SARS-CoV-2 RNA is generally detectable in upper and lower respiratory specimens during the acute phase of infection. The lowest concentration of SARS-CoV-2 viral copies this assay can detect is 250 copies / mL. A negative result does not preclude SARS-CoV-2 infection and should not be used as the sole basis for treatment or other patient management decisions.  A negative result may occur with improper specimen collection / handling, submission of specimen other than nasopharyngeal swab, presence of viral mutation(s) within the areas targeted by this assay, and inadequate number of viral copies (<250 copies / mL). A negative result must be combined with clinical observations, patient history, and epidemiological information.  Fact Sheet for Patients:   https://www.patel.info/  Fact Sheet for Healthcare Providers: https://hall.com/  This test is not yet approved or  cleared by the [place] FDA and has been authorized for detection and/or diagnosis of SARS-CoV-2 by FDA under an Emergency Use Authorization (EUA).  This EUA will remain in effect (meaning this test can be used) for the duration of the COVID-19 declaration under  Section 564(b)(1) of the Act, 21 U.S.C. section 360bbb-3(b)(1), unless the authorization is terminated or revoked sooner.  Performed at [facility], [address] [place], [place] [address]      Labs: BNP (last 3 results) No results for input(s): "BNP" in the last 8760 hours. Basic Metabolic Panel: Recent Labs  Lab 12/10/21 1759 12/11/21 0418 12/12/21 0415  NA 136 140 141  K 4.6 4.2 4.8  CL 106 114* 114*  CO2 20* 20* 20*  GLUCOSE 166* 153* 161*  BUN 41* 41* 25*  CREATININE 2.15* 1.30* 0.81  CALCIUM 10.1 9.0 9.1   Liver Function Tests: Recent Labs  Lab 12/10/21 1759  AST 18  ALT 14  ALKPHOS 87  BILITOT 0.3  PROT 7.5  ALBUMIN 4.4  No results for input(s): "LIPASE", "AMYLASE" in the last 168 hours. No results for input(s): "AMMONIA" in the last 168 hours. CBC: Recent Labs  Lab 12/10/21 1759 12/11/21 0418  WBC 10.9* 5.9  NEUTROABS 7.6  --   HGB 11.5* 9.8*  HCT 36.5 30.8*  MCV 89.7 88.8  PLT 269 207   Cardiac Enzymes: Recent Labs  Lab 12/10/21 1912  CKTOTAL 90   BNP: Invalid input(s): "POCBNP" CBG: Recent Labs  Lab 12/11/21 0826 12/11/21 1125 12/11/21 1613 12/11/21 2120 12/12/21 0803  GLUCAP 129* 196* 157* 129* 135*   D-Dimer No results for input(s): "DDIMER" in the last 72 hours. Hgb A1c No results for input(s): "HGBA1C" in the last 72 hours. Lipid Profile No results for input(s): "CHOL", "HDL", "LDLCALC", "TRIG", "CHOLHDL", "LDLDIRECT" in the last 72 hours. Thyroid function studies No results for input(s): "TSH", "T4TOTAL", "T3FREE", "THYROIDAB" in the last 72 hours.  Invalid input(s): "FREET3" Anemia work up No results for input(s): "VITAMINB12", "FOLATE", "FERRITIN", "TIBC", "IRON", "RETICCTPCT" in the last 72 hours. Urinalysis    Component Value Date/Time   COLORURINE AMBER (A) 12/10/2021 1758   APPEARANCEUR CLOUDY (A) 12/10/2021 1758   LABSPEC 1.023 12/10/2021 1758   PHURINE 5.0 12/10/2021 1758   GLUCOSEU NEGATIVE  12/10/2021 1758   HGBUR NEGATIVE 12/10/2021 1758   BILIRUBINUR SMALL (A) 12/10/2021 1758   BILIRUBINUR negative 10/03/2017 1324   KETONESUR 5 (A) 12/10/2021 1758   PROTEINUR 100 (A) 12/10/2021 1758   UROBILINOGEN 0.2 10/03/2017 1324   NITRITE NEGATIVE 12/10/2021 1758   LEUKOCYTESUR SMALL (A) 12/10/2021 1758   Sepsis Labs Recent Labs  Lab 12/10/21 1759 12/11/21 0418  WBC 10.9* 5.9   Microbiology Recent Results (from the past 240 hour(s))  Urine Culture     Status: Abnormal   Collection Time: 12/10/21  7:05 PM   Specimen: Urine, Random  Result Value Ref Range Status   Specimen Description   Final    URINE, RANDOM Performed at [facility], [address] [place], [place] [address]    Special Requests   Final    NONE Performed at [facility], [address] [place], [place] [address]    Culture (A)  Final    <10,000 COLONIES/mL INSIGNIFICANT GROWTH Performed at [facility], [place] [address], [place], [place] [address]    Report Status 12/11/2021 FINAL  Final  SARS Coronavirus 2 by RT PCR (hospital order, performed in [facility] hospital lab) *cepheid single result test* Anterior Nasal Swab     Status: None   Collection Time: 12/10/21  7:24 PM   Specimen: Anterior Nasal Swab  Result Value Ref Range Status   SARS Coronavirus 2 by RT PCR NEGATIVE NEGATIVE Final    Comment: (NOTE) SARS-CoV-2 target nucleic acids are NOT DETECTED.  The SARS-CoV-2 RNA is generally detectable in upper and lower respiratory specimens during the acute phase of infection. The lowest concentration of SARS-CoV-2 viral copies this assay can detect is 250 copies / mL. A negative result does not preclude SARS-CoV-2 infection and should not be used as the sole basis for treatment or other patient management decisions.  A negative result may occur with improper specimen collection / handling, submission of specimen other than nasopharyngeal swab, presence of viral  mutation(s) within the areas targeted by this assay, and inadequate number of viral copies (<250 copies / mL). A negative result must be combined with clinical observations, patient history, and epidemiological information.  Fact Sheet for Patients:   https://www.patel.info/  Fact Sheet for Healthcare Providers: https://hall.com/  This test is not yet approved or  cleared by the [place] FDA and has been authorized for detection and/or diagnosis of SARS-CoV-2 by FDA under an Emergency Use Authorization (EUA).  This EUA will remain in effect (meaning this test can be used) for the duration of the COVID-19 declaration under Section 564(b)(1) of the Act, 21 U.S.C. section 360bbb-3(b)(1), unless the authorization is terminated or revoked sooner.  Performed at [facility], [address] [place], [place] [address]     Please note: You were cared for by a hospitalist during your hospital stay. Once you are discharged, your primary care physician will handle any further medical issues. Please note that NO REFILLS for any discharge medications will be authorized once you are discharged, as it is imperative that you return to your primary care physician (or establish a relationship with a primary care physician if you do not have one) for your post hospital discharge needs so that they can reassess your need for medications and monitor your lab values.    Time coordinating discharge: 40 minutes  SIGNED:   Shelly Coss, MD  [facility] 12/12/2021, 9:10 AM Pager [phone]  If 7PM-7AM, please contact night-coverage [email or web address] Password [email or web address]

## 2021-12-12 NOTE — Plan of Care (Signed)

## 2021-12-13 ENCOUNTER — Telehealth: Payer: Self-pay

## 2021-12-13 NOTE — Telephone Encounter (Signed)
Transition Care Management Follow-up Telephone Call Date of discharge and from where: [facility] - 12/12/2021 How have you been since you were released from the hospital? Feeling better slowly Any questions or concerns? No  Items Reviewed: Did the pt receive and understand the discharge instructions provided? Yes  Medications obtained and verified? Yes  Other? No  Any new allergies since your discharge? No  Dietary orders reviewed? Yes Do you have support at home? Yes   Home Care and Equipment/Supplies: Were home health services ordered? not applicable If so, what is the name of the agency? N/A  Has the agency set up a time to come to the patient's home? not applicable Were any new equipment or medical supplies ordered?  No What is the name of the medical supply agency? N/A Were you able to get the supplies/equipment? not applicable Do you have any questions related to the use of the equipment or supplies? No  Functional Questionnaire: (I = Independent and D = Dependent) ADLs: I  Bathing/Dressing- I  Meal Prep- I  Eating- I  Maintaining continence- I  Transferring/Ambulation- I  Managing Meds- I  Follow up appointments reviewed:  PCP Hospital f/u appt confirmed? Yes  Scheduled to see Dr. Otilio Miu on 12/20/2021 @ 3:20PM. Specialist Hospital f/u appt confirmed? No   Are transportation arrangements needed? No  If their condition worsens, is the pt aware to call PCP or go to the Emergency Dept.? Yes Was the patient provided with contact information for the PCP's office or ED? Yes Was to pt encouraged to call back with questions or concerns? Yes

## 2021-12-19 ENCOUNTER — Ambulatory Visit: Payer: Self-pay

## 2021-12-19 ENCOUNTER — Encounter: Payer: Self-pay | Admitting: Internal Medicine

## 2021-12-19 ENCOUNTER — Telehealth (INDEPENDENT_AMBULATORY_CARE_PROVIDER_SITE_OTHER): Payer: BC Managed Care – PPO | Admitting: Internal Medicine

## 2021-12-19 VITALS — BP 128/74 | HR 94 | Ht 68.0 in | Wt 233.0 lb

## 2021-12-19 DIAGNOSIS — N760 Acute vaginitis: Secondary | ICD-10-CM | POA: Diagnosis not present

## 2021-12-19 DIAGNOSIS — N8111 Cystocele, midline: Secondary | ICD-10-CM | POA: Diagnosis not present

## 2021-12-19 DIAGNOSIS — N10 Acute tubulo-interstitial nephritis: Secondary | ICD-10-CM

## 2021-12-19 LAB — POCT URINALYSIS DIPSTICK
Bilirubin, UA: NEGATIVE
Blood, UA: NEGATIVE
Glucose, UA: NEGATIVE
Ketones, UA: NEGATIVE
Nitrite, UA: NEGATIVE
Protein, UA: NEGATIVE
Spec Grav, UA: 1.015 (ref 1.010–1.025)
Urobilinogen, UA: 0.2 E.U./dL
pH, UA: 6 (ref 5.0–8.0)

## 2021-12-19 LAB — POCT WET PREP WITH KOH
Clue Cells Wet Prep HPF POC: NEGATIVE
KOH Prep POC: NEGATIVE
RBC Wet Prep HPF POC: NEGATIVE
Trichomonas, UA: NEGATIVE

## 2021-12-19 NOTE — Telephone Encounter (Signed)
  Chief Complaint: yeast infection  Symptoms: vaginal itching, discharge, low abdominal and back pain Frequency: today Pertinent Negatives: NA Disposition: '[]'$ ED /'[]'$ Urgent Care (no appt availability in office) / '[x]'$ Appointment(In office/virtual)/ '[]'$  [facility]/ '[]'$ Home Care/ '[]'$ Refused Recommended Disposition /'[]'$ [facility]/ '[]'$  Follow-up with PCP Additional Notes: pt has completed abx that was given from hospital and started to have sx today. Pt states that she has appt with Dr. Ronnald Ramp for [place] on Tuesday but cant come sooner d/t husband just had surgery and daughter in the hospital currently. Scheduled pt for VV with Dr. Army Melia at 1540.   Summary: request for rx   Pt requesting a rx for yeast infection   Pt states she got a yeast infection from antibiotics given to her last week at the hospital   Please fu w/ pt      Reason for Disposition  [1] Symptoms of a "yeast infection" (i.e., itchy, white discharge, not bad smelling) AND [2] not improved > 3 days following Care Advice  Answer Assessment - Initial Assessment Questions 1. SYMPTOM: "What's the main symptom you're concerned about?" (e.g., rash, itching, swelling, dryness)     Feeling "odd"  2. LOCATION: "Where is the  sx located?" (e.g., inside/outside, left/right)     outside 3. ONSET: "When did the  sx  start?"     today 4. PAIN: "Is there any pain?" If Yes, ask: "How bad is it?" (Scale: 1-10; mild, moderate, severe)   -  MILD (1-3): Doesn't interfere with normal activities.    -  MODERATE (4-7): Interferes with normal activities (e.g., work or school) or awakens from sleep.     -  SEVERE (8-10): Excruciating pain, unable to do any normal activities.     Lower abdomen and back  5. CAUSE: "What do you think is causing the symptoms?"     Yeast infection 6. OTHER SYMPTOMS: "Do you have any other symptoms?" (e.g., fever, vaginal bleeding, pain with urination)     Itching and discharge  Protocols used: [place]  Symptoms-A-AH

## 2021-12-19 NOTE — Progress Notes (Signed)
Date:  12/19/2021   Name:  Betty Walters   DOB:  1960-04-22   MRN:  [id number]   Chief Complaint: Urinary Tract Infection (Pt was in the hospital for kidney infection recently)  Urinary Tract Infection  This is a new problem. Episode onset: x1 day. The problem occurs every urination. The problem has been unchanged. The quality of the pain is described as aching. The pain is at a severity of 4/10. The pain is mild. There has been no fever. She is Not sexually active. There is A history of pyelonephritis. Associated symptoms include a discharge, frequency and urgency. Pertinent negatives include no chills or flank pain. She has tried nothing for the symptoms.  Vaginal Discharge The patient's primary symptoms include pelvic pain (and pressure). The patient's pertinent negatives include no vaginal discharge. This is a new problem. The current episode started in the past 7 days. The problem occurs daily. The problem has been unchanged. Associated symptoms include frequency and urgency. Pertinent negatives include no chills, fever or flank pain.    Lab Results  Component Value Date   NA 141 12/12/2021   K 4.8 12/12/2021   CO2 20 (L) 12/12/2021   GLUCOSE 161 (H) 12/12/2021   BUN 25 (H) 12/12/2021   CREATININE 0.81 12/12/2021   CALCIUM 9.1 12/12/2021   EGFR 76 10/11/2021   GFRNONAA >60 12/12/2021   Lab Results  Component Value Date   CHOL 198 10/11/2021   HDL 58 10/11/2021   LDLCALC 110 (H) 10/11/2021   TRIG 171 (H) 10/11/2021   CHOLHDL 4.6 (H) 08/05/2018   No results found for: "TSH" Lab Results  Component Value Date   HGBA1C 7.4 (H) 10/11/2021   Lab Results  Component Value Date   WBC 5.9 12/11/2021   HGB 9.8 (L) 12/11/2021   HCT 30.8 (L) 12/11/2021   MCV 88.8 12/11/2021   PLT 207 12/11/2021   Lab Results  Component Value Date   ALT 14 12/10/2021   AST 18 12/10/2021   ALKPHOS 87 12/10/2021   BILITOT 0.3 12/10/2021   No results found for: "25OHVITD2", "25OHVITD3",  "VD25OH"   Review of Systems  Constitutional:  Negative for chills, fatigue and fever.  Respiratory:  Negative for chest tightness and shortness of breath.   Cardiovascular:  Negative for chest pain.  Genitourinary:  Positive for frequency, pelvic pain (and pressure) and urgency. Negative for flank pain and vaginal discharge.    Patient Active Problem List   Diagnosis Date Noted   Acute pyelonephritis 12/10/2021   AKI (acute kidney injury) ([place]) 12/10/2021   Type 2 diabetes mellitus with peripheral neuropathy ([place]) 12/10/2021   Dyslipidemia 12/10/2021   Depression 12/10/2021   GERD without esophagitis 12/10/2021   Sprain, metatarsophalangeal joint 01/26/2018   Heartburn    Dyspepsia    Change in bowel habits    Benign neoplasm of descending colon    Chronic pain of toes of both feet 09/20/2016   Type 2 diabetes mellitus with complication ([place]) 09/32/6712   Allergic rhinitis due to pollen 06/21/2015   Concussion with coma 10/21/2014   Clinical depression 09/28/2014   Diabetes mellitus, type 2 ([place]) 09/28/2014   HLD (hyperlipidemia) 09/28/2014   Essential hypertension 09/28/2014   Adiposity 09/28/2014    Allergies  Allergen Reactions   Amoxicillin-Pot Clavulanate Nausea And Vomiting    (OK with Amoxicillin alone)    Past Surgical History:  Procedure Laterality Date   CESAREAN SECTION     COLONOSCOPY WITH PROPOFOL N/A  04/11/2017   Procedure: COLONOSCOPY WITH colon;  Surgeon: Lucilla Lame, MD;  Location: [facility];  Service: Endoscopy;  Laterality: N/A;   ESOPHAGOGASTRODUODENOSCOPY (EGD) WITH PROPOFOL N/A 04/11/2017   Procedure: ESOPHAGOGASTRODUODENOSCOPY (EGD) WITH PROPOFOL;  Surgeon: Lucilla Lame, MD;  Location: [facility];  Service: Endoscopy;  Laterality: N/A;  Diabetic - oral meds   GALLBLADDER SURGERY     KNEE SURGERY     MYRINGOTOMY WITH TUBE PLACEMENT Right 03/22/2020   Procedure: MYRINGOTOMY WITH TUBE PLACEMENT;  Surgeon: Carloyn Manner, MD;   Location: [facility];  Service: ENT;  Laterality: Right;  Diabetic   POLYPECTOMY  04/11/2017   Procedure: POLYPECTOMY INTESTINAL;  Surgeon: Lucilla Lame, MD;  Location: [facility];  Service: Endoscopy;;   WRIST SURGERY      Social History   Tobacco Use   Smoking status: Never   Smokeless tobacco: Never  Vaping Use   Vaping Use: Never used  Substance Use Topics   Alcohol use: Yes    Alcohol/week: 0.0 standard drinks of alcohol    Comment: rare - Holidays   Drug use: No     Medication list has been reviewed and updated.  Current Meds  Medication Sig   cyclobenzaprine (FLEXERIL) 5 MG tablet Take 1 tablet (5 mg total) by mouth at bedtime as needed.   dicyclomine (BENTYL) 20 MG tablet TAKE 1 TABLET BY MOUTH THREE TIMES DAILY BEFORE MEAL(S)   gabapentin (NEURONTIN) 300 MG capsule Take 1 capsule by mouth at bedtime   Insulin Pen Needle (PEN NEEDLES 3/16") 31G X 5 MM MISC 1 each by Does not apply route daily.   liraglutide (VICTOZA) 18 MG/3ML SOPN INJECT 1.8 MG INTO THE SKIN ONCE DAILY.   lisinopril (ZESTRIL) 20 MG tablet Take 1 tablet (20 mg total) by mouth daily.   lovastatin (MEVACOR) 40 MG tablet Take 1 tablet dailyTAKE 1 TABLET BY MOUTH ONCE DAILY.   metFORMIN (GLUCOPHAGE) 1000 MG tablet Take 1 tablet (1,000 mg total) by mouth 2 (two) times daily.   montelukast (SINGULAIR) 10 MG tablet Take 1 tablet (10 mg total) by mouth at bedtime.   pantoprazole (PROTONIX) 40 MG tablet Take 1 tablet (40 mg total) by mouth daily.   pioglitazone (ACTOS) 15 MG tablet Take 1 tablet daily   sertraline (ZOLOFT) 100 MG tablet TAKE 1 TABLET BY MOUTH ONCE DAILY TAKE ALONG WITH 50MG TABLET FOR A TOTAL OF 150MG   sertraline (ZOLOFT) 50 MG tablet TAKE 1 TABLET BY MOUTH ONCE DAILY ALONG WITH 100 MG TABLET FOR A TOTAL DOSE OF 150MG. APPT NEEDED BEFORE 30 DAYS ARE UP       12/19/2021    3:43 PM 10/11/2021   11:09 AM 05/21/2021   10:19 AM 01/16/2021   10:29 AM  GAD 7 : Generalized Anxiety  Score  Nervous, Anxious, on Edge 0 0 0 0  Control/stop worrying 1 0 0 0  Worry too much - different things 1 1 0 0  Trouble relaxing 0 0 0 0  Restless 0 0 0 0  Easily annoyed or irritable 1 1 0 0  Afraid - awful might happen 0 0 0 0  Total GAD 7 Score 3 2 0 0  Anxiety Difficulty Not difficult at all Not difficult at all Not difficult at all        12/19/2021    3:38 PM 10/11/2021   11:09 AM 05/21/2021   10:18 AM  Depression screen PHQ 2/9  Decreased Interest 0 0 0  Down, Depressed, Hopeless 0 0 0  PHQ - 2 Score 0 0 0  Altered sleeping 0 1 0  Tired, decreased energy 0 1 0  Change in appetite 0 0 0  Feeling bad or failure about yourself  0 0 0  Trouble concentrating 0 0 0  Moving slowly or fidgety/restless 0 0 0  Suicidal thoughts 0 0 0  PHQ-9 Score 0 2 0  Difficult doing work/chores Not difficult at all Not difficult at all Not difficult at all    BP Readings from Last 3 Encounters:  12/19/21 128/74  12/12/21 124/62  10/11/21 120/80    Physical Exam Vitals and nursing note reviewed.  Constitutional:      General: She is not in acute distress.    Appearance: She is well-developed.  HENT:     Head: Normocephalic and atraumatic.  Cardiovascular:     Rate and Rhythm: Normal rate and regular rhythm.  Pulmonary:     Effort: Pulmonary effort is normal. No respiratory distress.     Breath sounds: No wheezing or rhonchi.  Abdominal:     Palpations: Abdomen is soft. There is no mass.     Tenderness: There is abdominal tenderness in the right lower quadrant and left lower quadrant. There is no right CVA tenderness, left CVA tenderness or guarding.  Skin:    General: Skin is warm and dry.     Findings: No rash.  Neurological:     Mental Status: She is alert and oriented to person, place, and time.  Psychiatric:        Mood and Affect: Mood normal.        Behavior: Behavior normal.    CT stone study 12/10/21: IMPRESSION: There is no evidence of intestinal obstruction or  pneumoperitoneum. There is no hydronephrosis.   Lower portion of urinary bladder is below the level of pubic symphysis suggesting cystocele.  Wt Readings from Last 3 Encounters:  12/19/21 233 lb (105.7 kg)  12/10/21 233 lb (105.7 kg)  10/11/21 233 lb (105.7 kg)    BP 128/74   Pulse 94   Ht 5' 8" (1.727 m)   Wt 233 lb (105.7 kg)   SpO2 97%   BMI 35.43 kg/m   Assessment and Plan: 1. Cystocele, midline Likely the cause of her urinary retention for which she was admitted a week ago.  Seen clearly on CT. Recommend GYN consultation. Manual cystocele reduction described to help her empty more completely - Ambulatory referral to Obstetrics / Gynecology  2. Acute vaginitis No evidence of BV or yeast  - POCT Wet Prep with KOH  3. Acute pyelonephritis Appears to be resolved. UCx from admission < 10,000 col/ml - POCT urinalysis dipstick   Partially dictated using Editor, commissioning. Any errors are unintentional.  Halina Maidens, MD [facility]  12/19/2021

## 2021-12-20 ENCOUNTER — Inpatient Hospital Stay: Payer: BC Managed Care – PPO | Admitting: Family Medicine

## 2021-12-25 ENCOUNTER — Ambulatory Visit: Payer: BC Managed Care – PPO | Admitting: Family Medicine

## 2021-12-25 ENCOUNTER — Encounter: Payer: Self-pay | Admitting: Family Medicine

## 2021-12-25 ENCOUNTER — Ambulatory Visit: Payer: BC Managed Care – PPO | Admitting: Podiatry

## 2021-12-25 VITALS — BP 110/70 | HR 80 | Ht 68.0 in | Wt 235.0 lb

## 2021-12-25 DIAGNOSIS — E860 Dehydration: Secondary | ICD-10-CM | POA: Diagnosis not present

## 2021-12-25 DIAGNOSIS — N12 Tubulo-interstitial nephritis, not specified as acute or chronic: Secondary | ICD-10-CM

## 2021-12-25 DIAGNOSIS — Z23 Encounter for immunization: Secondary | ICD-10-CM

## 2021-12-25 LAB — POCT URINALYSIS DIPSTICK

## 2021-12-25 NOTE — Progress Notes (Unsigned)
Date:  12/25/2021   Name:  Betty Walters   DOB:  1961-01-29   MRN:  [id number]   Chief Complaint: Hospitalization Follow-up (Admitted on 12/10/21 with pyelonephritis and dehydration. D/c on 12/12/21. TOC call placed on 12/13/21)  Follow up Hospitalization  Patient was admitted to [facility] on 12/10/21 and discharged on 9/13 23. She was treated for pyelonephritis. Treatment for this included iv antibiotic. Telephone follow up was done on 12/13/21 She reports excellent compliance with treatment. She reports this condition is improved.  ----------------------------------------------------------------------------------------- -     Urinary Tract Infection  This is a new problem. The current episode started more than 1 year ago. The problem has been gradually improving. The patient is experiencing no pain. There has been no fever. Pertinent negatives include no chills, flank pain, frequency, hematuria, hesitancy, nausea, sweats or urgency.    Lab Results  Component Value Date   NA 141 12/12/2021   K 4.8 12/12/2021   CO2 20 (L) 12/12/2021   GLUCOSE 161 (H) 12/12/2021   BUN 25 (H) 12/12/2021   CREATININE 0.81 12/12/2021   CALCIUM 9.1 12/12/2021   EGFR 76 10/11/2021   GFRNONAA >60 12/12/2021   Lab Results  Component Value Date   CHOL 198 10/11/2021   HDL 58 10/11/2021   LDLCALC 110 (H) 10/11/2021   TRIG 171 (H) 10/11/2021   CHOLHDL 4.6 (H) 08/05/2018   No results found for: "TSH" Lab Results  Component Value Date   HGBA1C 7.4 (H) 10/11/2021   Lab Results  Component Value Date   WBC 5.9 12/11/2021   HGB 9.8 (L) 12/11/2021   HCT 30.8 (L) 12/11/2021   MCV 88.8 12/11/2021   PLT 207 12/11/2021   Lab Results  Component Value Date   ALT 14 12/10/2021   AST 18 12/10/2021   ALKPHOS 87 12/10/2021   BILITOT 0.3 12/10/2021   No results found for: "25OHVITD2", "25OHVITD3", "VD25OH"   Review of Systems  Constitutional:  Negative for chills and fever.  HENT:   Negative for drooling, ear discharge, ear pain and sore throat.   Respiratory:  Negative for cough, shortness of breath and wheezing.   Cardiovascular:  Negative for chest pain, palpitations and leg swelling.  Gastrointestinal:  Negative for abdominal pain, blood in stool, constipation, diarrhea and nausea.  Endocrine: Negative for polydipsia.  Genitourinary:  Negative for dysuria, flank pain, frequency, hematuria, hesitancy and urgency.  Musculoskeletal:  Negative for back pain, myalgias and neck pain.  Skin:  Negative for rash.  Allergic/Immunologic: Negative for environmental allergies.  Neurological:  Negative for dizziness and headaches.  Hematological:  Does not bruise/bleed easily.  Psychiatric/Behavioral:  Negative for suicidal ideas. The patient is not nervous/anxious.     Patient Active Problem List   Diagnosis Date Noted   Acute pyelonephritis 12/10/2021   AKI (acute kidney injury) ([place]) 12/10/2021   Type 2 diabetes mellitus with peripheral neuropathy ([place]) 12/10/2021   Dyslipidemia 12/10/2021   Depression 12/10/2021   GERD without esophagitis 12/10/2021   Sprain, metatarsophalangeal joint 01/26/2018   Heartburn    Dyspepsia    Change in bowel habits    Benign neoplasm of descending colon    Chronic pain of toes of both feet 09/20/2016   Type 2 diabetes mellitus with complication ([place]) 81/59/4707   Allergic rhinitis due to pollen 06/21/2015   Concussion with coma 10/21/2014   Clinical depression 09/28/2014   Diabetes mellitus, type 2 ([place]) 09/28/2014   HLD (hyperlipidemia) 09/28/2014   Essential hypertension  09/28/2014   Adiposity 09/28/2014    Allergies  Allergen Reactions   Amoxicillin-Pot Clavulanate Nausea And Vomiting    (OK with Amoxicillin alone)    Past Surgical History:  Procedure Laterality Date   CESAREAN SECTION     COLONOSCOPY WITH PROPOFOL N/A 04/11/2017   Procedure: COLONOSCOPY WITH colon;  Surgeon: Lucilla Lame, MD;  Location: [facility];  Service: Endoscopy;  Laterality: N/A;   ESOPHAGOGASTRODUODENOSCOPY (EGD) WITH PROPOFOL N/A 04/11/2017   Procedure: ESOPHAGOGASTRODUODENOSCOPY (EGD) WITH PROPOFOL;  Surgeon: Lucilla Lame, MD;  Location: [facility];  Service: Endoscopy;  Laterality: N/A;  Diabetic - oral meds   GALLBLADDER SURGERY     KNEE SURGERY     MYRINGOTOMY WITH TUBE PLACEMENT Right 03/22/2020   Procedure: MYRINGOTOMY WITH TUBE PLACEMENT;  Surgeon: Carloyn Manner, MD;  Location: [facility];  Service: ENT;  Laterality: Right;  Diabetic   POLYPECTOMY  04/11/2017   Procedure: POLYPECTOMY INTESTINAL;  Surgeon: Lucilla Lame, MD;  Location: [facility];  Service: Endoscopy;;   WRIST SURGERY      Social History   Tobacco Use   Smoking status: Never   Smokeless tobacco: Never  Vaping Use   Vaping Use: Never used  Substance Use Topics   Alcohol use: Yes    Alcohol/week: 0.0 standard drinks of alcohol    Comment: rare - Holidays   Drug use: No     Medication list has been reviewed and updated.  Current Meds  Medication Sig   cyclobenzaprine (FLEXERIL) 5 MG tablet Take 1 tablet (5 mg total) by mouth at bedtime as needed.   dicyclomine (BENTYL) 20 MG tablet TAKE 1 TABLET BY MOUTH THREE TIMES DAILY BEFORE MEAL(S)   gabapentin (NEURONTIN) 300 MG capsule Take 1 capsule by mouth at bedtime   Insulin Pen Needle (PEN NEEDLES 3/16") 31G X 5 MM MISC 1 each by Does not apply route daily.   liraglutide (VICTOZA) 18 MG/3ML SOPN INJECT 1.8 MG INTO THE SKIN ONCE DAILY.   lisinopril (ZESTRIL) 20 MG tablet Take 1 tablet (20 mg total) by mouth daily.   lovastatin (MEVACOR) 40 MG tablet Take 1 tablet dailyTAKE 1 TABLET BY MOUTH ONCE DAILY.   metFORMIN (GLUCOPHAGE) 1000 MG tablet Take 1 tablet (1,000 mg total) by mouth 2 (two) times daily.   montelukast (SINGULAIR) 10 MG tablet Take 1 tablet (10 mg total) by mouth at bedtime.   pantoprazole (PROTONIX) 40 MG tablet Take 1 tablet (40 mg total) by mouth  daily.   pioglitazone (ACTOS) 15 MG tablet Take 1 tablet daily   sertraline (ZOLOFT) 100 MG tablet TAKE 1 TABLET BY MOUTH ONCE DAILY TAKE ALONG WITH 50MG TABLET FOR A TOTAL OF 150MG   sertraline (ZOLOFT) 50 MG tablet TAKE 1 TABLET BY MOUTH ONCE DAILY ALONG WITH 100 MG TABLET FOR A TOTAL DOSE OF 150MG. APPT NEEDED BEFORE 30 DAYS ARE UP       12/25/2021    4:22 PM 12/19/2021    3:43 PM 10/11/2021   11:09 AM 05/21/2021   10:19 AM  GAD 7 : Generalized Anxiety Score  Nervous, Anxious, on Edge 0 0 0 0  Control/stop worrying 0 1 0 0  Worry too much - different things 0 1 1 0  Trouble relaxing 0 0 0 0  Restless 0 0 0 0  Easily annoyed or irritable 0 1 1 0  Afraid - awful might happen 0 0 0 0  Total GAD 7 Score 0 3 2 0  Anxiety Difficulty Not difficult at all Not difficult at all Not difficult at all Not difficult at all       12/25/2021    4:22 PM 12/19/2021    3:38 PM 10/11/2021   11:09 AM  Depression screen PHQ 2/9  Decreased Interest 0 0 0  Down, Depressed, Hopeless 0 0 0  PHQ - 2 Score 0 0 0  Altered sleeping 0 0 1  Tired, decreased energy 0 0 1  Change in appetite 0 0 0  Feeling bad or failure about yourself  0 0 0  Trouble concentrating 0 0 0  Moving slowly or fidgety/restless 0 0 0  Suicidal thoughts 0 0 0  PHQ-9 Score 0 0 2  Difficult doing work/chores Not difficult at all Not difficult at all Not difficult at all    BP Readings from Last 3 Encounters:  12/25/21 110/70  12/19/21 128/74  12/12/21 124/62    Physical Exam Vitals and nursing note reviewed.  Constitutional:      Appearance: She is well-developed.  HENT:     Head: Normocephalic.     Right Ear: External ear normal.     Left Ear: External ear normal.     Mouth/Throat:     Mouth: Mucous membranes are moist.  Eyes:     General: Lids are everted, no foreign bodies appreciated. No scleral icterus.       Left eye: No foreign body or hordeolum.     Conjunctiva/sclera: Conjunctivae normal.     Right eye:  Right conjunctiva is not injected.     Left eye: Left conjunctiva is not injected.     Pupils: Pupils are equal, round, and reactive to light.  Neck:     Thyroid: No thyromegaly.     Vascular: No JVD.     Trachea: No tracheal deviation.  Cardiovascular:     Rate and Rhythm: Normal rate and regular rhythm.     Heart sounds: Normal heart sounds. No murmur heard.    No friction rub. No gallop.  Pulmonary:     Effort: Pulmonary effort is normal. No respiratory distress.     Breath sounds: Normal breath sounds. No wheezing, rhonchi or rales.  Abdominal:     General: Bowel sounds are normal.     Palpations: Abdomen is soft. There is no mass.     Tenderness: There is no abdominal tenderness. There is no guarding or rebound.  Musculoskeletal:        General: No tenderness. Normal range of motion.     Cervical back: Normal range of motion and neck supple.  Lymphadenopathy:     Cervical: No cervical adenopathy.  Skin:    General: Skin is warm.     Findings: No rash.  Neurological:     Mental Status: She is alert and oriented to person, place, and time.     Cranial Nerves: No cranial nerve deficit.     Deep Tendon Reflexes: Reflexes normal.  Psychiatric:        Mood and Affect: Mood is not anxious or depressed.    Wt Readings from Last 3 Encounters:  12/25/21 235 lb (106.6 kg)  12/19/21 233 lb (105.7 kg)  12/10/21 233 lb (105.7 kg)    BP 110/70   Pulse 80   Ht '5\' 8"'  (1.727 m)   Wt 235 lb (106.6 kg)   BMI 35.73 kg/m   Assessment and Plan: Patient has hospital follow-up for admission of pyelonephritis and dehydration requiring IV hydration.  1. Pyelonephritis New onset.  Patient is resolving from pyelonephritis status post IV antibiotics and was discharged on cefdinir 300 mg twice a day.  Patient is asymptomatic with resolving discomfort.  We will obtain dipstick urine which is unremarkable for infection and was unable to send for culture due to insufficient urine. - POCT  urinalysis dipstick  2. Dehydration New onset.  Resolving.  Stable.  Patient had AKI and was hydrated.  We will recheck renal function panel for GFR and creatinine. - Renal Function Panel  3. Need for immunization against influenza Discussed and administered. - Flu Vaccine QUAD 102moIM (Fluarix, Fluzone & Alfiuria Quad PF)     DOtilio Miu MD

## 2021-12-26 LAB — POCT URINALYSIS DIPSTICK
Bilirubin, UA: NEGATIVE
Blood, UA: NEGATIVE
Glucose, UA: NEGATIVE
Ketones, UA: NEGATIVE
Leukocytes, UA: NEGATIVE
Nitrite, UA: NEGATIVE
Protein, UA: NEGATIVE
Spec Grav, UA: 1.02 (ref 1.010–1.025)
Urobilinogen, UA: 0.2 E.U./dL
pH, UA: 6 (ref 5.0–8.0)

## 2021-12-26 LAB — RENAL FUNCTION PANEL
Albumin: 4.6 g/dL (ref 3.8–4.9)
BUN/Creatinine Ratio: 22 (ref 9–23)
BUN: 31 mg/dL — ABNORMAL HIGH (ref 6–24)
CO2: 18 mmol/L — ABNORMAL LOW (ref 20–29)
Calcium: 9.6 mg/dL (ref 8.7–10.2)
Chloride: 102 mmol/L (ref 96–106)
Creatinine, Ser: 1.41 mg/dL — ABNORMAL HIGH (ref 0.57–1.00)
Glucose: 130 mg/dL — ABNORMAL HIGH (ref 70–99)
Phosphorus: 3.2 mg/dL (ref 3.0–4.3)
Potassium: 4.6 mmol/L (ref 3.5–5.2)
Sodium: 137 mmol/L (ref 134–144)
eGFR: 43 mL/min/{1.73_m2} — ABNORMAL LOW (ref >59)

## 2021-12-26 NOTE — Addendum Note (Signed)
Addended by: Fredderick Severance on: 12/26/2021 07:54 AM   Modules accepted: Orders

## 2022-01-01 ENCOUNTER — Ambulatory Visit: Payer: BC Managed Care – PPO | Admitting: Podiatry

## 2022-01-01 DIAGNOSIS — M7662 Achilles tendinitis, left leg: Secondary | ICD-10-CM | POA: Diagnosis not present

## 2022-01-01 MED ORDER — CELECOXIB 100 MG PO CAPS: 100.0000 mg | ORAL_CAPSULE | Freq: Two times a day (BID) | ORAL | 1 refills | Status: DC

## 2022-01-01 NOTE — Progress Notes (Signed)
Chief Complaint  Patient presents with    ACHILLES TENDINITIS LT    Patient os here for ACHILLES TENDINITIS LT, patient states that the pain is still the same, the injection did not last long this last time.    HPI: 61 y.o. female presenting today for follow-up evaluation of Achilles tendinitis to the left lower extremity.  Patient states that the injection only helped for about 2 weeks.  She continues to have some intermittent pain.  She has been wearing the walking cam boot intermittently with some relief.  She presents for further treatment and evaluation   Past Medical History:  Diagnosis Date   Concussion May 2016   Depression    Diabetes mellitus without complication ([place])    Family history of adverse reaction to anesthesia    Father - slow to wake   GERD (gastroesophageal reflux disease)    High cholesterol    Hypertension    Yeast infection    Past Surgical History:  Procedure Laterality Date   CESAREAN SECTION     COLONOSCOPY WITH PROPOFOL N/A 04/11/2017   Procedure: COLONOSCOPY WITH colon;  Surgeon: Lucilla Lame, MD;  Location: [facility];  Service: Endoscopy;  Laterality: N/A;   ESOPHAGOGASTRODUODENOSCOPY (EGD) WITH PROPOFOL N/A 04/11/2017   Procedure: ESOPHAGOGASTRODUODENOSCOPY (EGD) WITH PROPOFOL;  Surgeon: Lucilla Lame, MD;  Location: [facility];  Service: Endoscopy;  Laterality: N/A;  Diabetic - oral meds   GALLBLADDER SURGERY     KNEE SURGERY     MYRINGOTOMY WITH TUBE PLACEMENT Right 03/22/2020   Procedure: MYRINGOTOMY WITH TUBE PLACEMENT;  Surgeon: Carloyn Manner, MD;  Location: [facility];  Service: ENT;  Laterality: Right;  Diabetic   POLYPECTOMY  04/11/2017   Procedure: POLYPECTOMY INTESTINAL;  Surgeon: Lucilla Lame, MD;  Location: [facility];  Service: Endoscopy;;   WRIST SURGERY     Allergies  Allergen Reactions   Amoxicillin-Pot Clavulanate Nausea And Vomiting    (OK with Amoxicillin alone)      Physical  Exam: General: The patient is alert and oriented x3 in no acute distress.  Dermatology: Skin is warm, dry and supple bilateral lower extremities. Negative for open lesions or macerations.  Vascular: Palpable pedal pulses bilaterally. No edema or erythema noted. Capillary refill within normal limits.  Neurological: Epicritic and protective threshold diminished bilaterally.   Musculoskeletal Exam: There continues to be some pain on palpation noted to the posterior tubercle of the left calcaneus at the insertion of the Achilles tendon consistent with retrocalcaneal bursitis.  There is also pain on palpation along the plantar fascia as it inserts onto the calcaneus consistent with findings of a plantar fasciitis.  Range of motion within normal limits. Muscle strength 5/5 in all muscle groups bilateral lower extremities.  Radiographic exam LT foot 10/30/2021: Normal osseous mineralization.  Posterior and plantar heel spurs noted on lateral view.  Joint spaces mostly preserved.  No significant degenerative changes.  Assessment: 1. Insertional Achilles tendinitis left 2. Diabetes mellitus with peripheral polyneuropathy   Plan of Care:  1. Patient was evaluated. 2.  Patient states that physical therapy was expensive due to the co-pay per visit.  Recommend daily stretching exercises at home 3.  No Medrol Dosepak prescribed.  Patient states that it elevates her sugar levels 4.  Continue prescription for Celebrex 100 mg 2 times daily.  Patient requiring a refill of the Celebrex. 5.  Continue gabapentin 300 mg nightly as prescribed 7.  Patient also takes Flexeril from her PCP.  Continue as prescribed 8.  Continue the cam boot intermittently as needed for acute flareups  9.  Return to clinic as needed  *Optometrist at [facility]    Edrick Kins, DPM [facility]  Dr. Edrick Kins, DPM    [address],  [place] [address]                Office [phone]  Fax [phone]

## 2022-01-16 ENCOUNTER — Encounter: Payer: Self-pay | Admitting: Obstetrics & Gynecology

## 2022-02-04 ENCOUNTER — Other Ambulatory Visit: Payer: Self-pay | Admitting: Podiatry

## 2022-02-04 NOTE — Telephone Encounter (Signed)
Please advise 

## 2022-02-11 ENCOUNTER — Ambulatory Visit: Payer: BC Managed Care – PPO | Admitting: Family Medicine

## 2022-02-25 ENCOUNTER — Ambulatory Visit: Payer: BC Managed Care – PPO | Admitting: Family Medicine

## 2022-02-25 ENCOUNTER — Encounter: Payer: Self-pay | Admitting: Family Medicine

## 2022-02-25 VITALS — BP 128/68 | HR 82 | Ht 68.0 in | Wt 238.0 lb

## 2022-02-25 DIAGNOSIS — E119 Type 2 diabetes mellitus without complications: Secondary | ICD-10-CM | POA: Diagnosis not present

## 2022-02-25 MED ORDER — VICTOZA 18 MG/3ML ~~LOC~~ SOPN: PEN_INJECTOR | SUBCUTANEOUS | 1 refills | Status: DC

## 2022-02-25 MED ORDER — METFORMIN HCL 1000 MG PO TABS: 1000.0000 mg | ORAL_TABLET | Freq: Two times a day (BID) | ORAL | 1 refills | Status: DC

## 2022-02-25 MED ORDER — PIOGLITAZONE HCL 15 MG PO TABS: ORAL_TABLET | ORAL | 1 refills | Status: DC

## 2022-02-25 NOTE — Progress Notes (Signed)
Date:  02/25/2022   Name:  Betty Walters   DOB:  [birth year]   MRN:  [id number]   Chief Complaint: Diabetes (Foot exam/)  Diabetes She presents for her follow-up diabetic visit. She has type 2 diabetes mellitus. Her disease course has been stable. There are no hypoglycemic associated symptoms. Pertinent negatives for hypoglycemia include no dizziness, headaches or nervousness/anxiousness. Pertinent negatives for diabetes include no blurred vision, no chest pain, no fatigue, no foot paresthesias, no foot ulcerations, no polydipsia, no polyuria, no visual change, no weakness and no weight loss. There are no hypoglycemic complications. Symptoms are stable. There are no diabetic complications. There are no known risk factors for coronary artery disease. Current diabetic treatment includes oral agent (dual therapy) (and victoza). She is compliant with treatment all of the time. She is following a generally healthy diet. Meal planning includes avoidance of concentrated sweets. She participates in exercise intermittently. Her breakfast blood glucose range is generally 110-130 mg/dl. An ACE inhibitor/angiotensin II receptor blocker is being taken.    Lab Results  Component Value Date   NA 137 12/25/2021   K 4.6 12/25/2021   CO2 18 (L) 12/25/2021   GLUCOSE 130 (H) 12/25/2021   BUN 31 (H) 12/25/2021   CREATININE 1.41 (H) 12/25/2021   CALCIUM 9.6 12/25/2021   EGFR 43 (L) 12/25/2021   GFRNONAA >60 12/12/2021   Lab Results  Component Value Date   CHOL 198 10/11/2021   HDL 58 10/11/2021   LDLCALC 110 (H) 10/11/2021   TRIG 171 (H) 10/11/2021   CHOLHDL 4.6 (H) 08/05/2018   No results found for: "TSH" Lab Results  Component Value Date   HGBA1C 7.4 (H) 10/11/2021   Lab Results  Component Value Date   WBC 5.9 12/11/2021   HGB 9.8 (L) 12/11/2021   HCT 30.8 (L) 12/11/2021   MCV 88.8 12/11/2021   PLT 207 12/11/2021   Lab Results  Component Value Date   ALT 14 12/10/2021   AST 18  12/10/2021   ALKPHOS 87 12/10/2021   BILITOT 0.3 12/10/2021   No results found for: "25OHVITD2", "25OHVITD3", "VD25OH"   Review of Systems  Constitutional: Negative.  Negative for fatigue, unexpected weight change and weight loss.  HENT:  Negative for trouble swallowing.   Eyes:  Negative for blurred vision.  Respiratory:  Negative for shortness of breath and wheezing.   Cardiovascular:  Negative for chest pain.  Gastrointestinal:  Negative for abdominal pain, blood in stool and nausea.  Endocrine: Negative for polydipsia and polyuria.  Genitourinary:  Negative for dysuria and urgency.  Musculoskeletal:  Negative for arthralgias, back pain and myalgias.  Skin:  Negative for rash.  Neurological:  Negative for dizziness, weakness and headaches.  Hematological:  Negative for adenopathy.  Psychiatric/Behavioral:  Negative for dysphoric mood. The patient is not nervous/anxious.     Patient Active Problem List   Diagnosis Date Noted   Acute pyelonephritis 12/10/2021   AKI (acute kidney injury) ([place]) 12/10/2021   Type 2 diabetes mellitus with peripheral neuropathy ([place]) 12/10/2021   Dyslipidemia 12/10/2021   Depression 12/10/2021   GERD without esophagitis 12/10/2021   Sprain, metatarsophalangeal joint 01/26/2018   Heartburn    Dyspepsia    Change in bowel habits    Benign neoplasm of descending colon    Chronic pain of toes of both feet 09/20/2016   Type 2 diabetes mellitus with complication ([place]) 33/54/5625   Allergic rhinitis due to pollen 06/21/2015   Concussion with coma 10/21/2014  Clinical depression 09/28/2014   Diabetes mellitus, type 2 ([place]) 09/28/2014   HLD (hyperlipidemia) 09/28/2014   Essential hypertension 09/28/2014   Adiposity 09/28/2014    Allergies  Allergen Reactions   Amoxicillin-Pot Clavulanate Nausea And Vomiting    (OK with Amoxicillin alone)    Past Surgical History:  Procedure Laterality Date   CESAREAN SECTION     COLONOSCOPY WITH PROPOFOL  N/A 04/11/2017   Procedure: COLONOSCOPY WITH colon;  Surgeon: Lucilla Lame, MD;  Location: [facility];  Service: Endoscopy;  Laterality: N/A;   ESOPHAGOGASTRODUODENOSCOPY (EGD) WITH PROPOFOL N/A 04/11/2017   Procedure: ESOPHAGOGASTRODUODENOSCOPY (EGD) WITH PROPOFOL;  Surgeon: Lucilla Lame, MD;  Location: [facility];  Service: Endoscopy;  Laterality: N/A;  Diabetic - oral meds   GALLBLADDER SURGERY     KNEE SURGERY     MYRINGOTOMY WITH TUBE PLACEMENT Right 03/22/2020   Procedure: MYRINGOTOMY WITH TUBE PLACEMENT;  Surgeon: Carloyn Manner, MD;  Location: [facility];  Service: ENT;  Laterality: Right;  Diabetic   POLYPECTOMY  04/11/2017   Procedure: POLYPECTOMY INTESTINAL;  Surgeon: Lucilla Lame, MD;  Location: [facility];  Service: Endoscopy;;   WRIST SURGERY      Social History   Tobacco Use   Smoking status: Never   Smokeless tobacco: Never  Vaping Use   Vaping Use: Never used  Substance Use Topics   Alcohol use: Yes    Alcohol/week: 0.0 standard drinks of alcohol    Comment: rare - Holidays   Drug use: No     Medication list has been reviewed and updated.  Current Meds  Medication Sig   celecoxib (CELEBREX) 100 MG capsule Take 1 capsule (100 mg total) by mouth 2 (two) times daily.   cyclobenzaprine (FLEXERIL) 5 MG tablet Take 1 tablet (5 mg total) by mouth at bedtime as needed.   dicyclomine (BENTYL) 20 MG tablet TAKE 1 TABLET BY MOUTH THREE TIMES DAILY BEFORE MEAL(S)   gabapentin (NEURONTIN) 300 MG capsule TAKE 1 CAPSULE BY MOUTH THREE TIMES DAILY   Insulin Pen Needle (PEN NEEDLES 3/16") 31G X 5 MM MISC 1 each by Does not apply route daily.   liraglutide (VICTOZA) 18 MG/3ML SOPN INJECT 1.8 MG INTO THE SKIN ONCE DAILY.   lisinopril (ZESTRIL) 20 MG tablet Take 1 tablet (20 mg total) by mouth daily.   lovastatin (MEVACOR) 40 MG tablet Take 1 tablet dailyTAKE 1 TABLET BY MOUTH ONCE DAILY.   metFORMIN (GLUCOPHAGE) 1000 MG tablet Take 1 tablet  (1,000 mg total) by mouth 2 (two) times daily.   montelukast (SINGULAIR) 10 MG tablet Take 1 tablet (10 mg total) by mouth at bedtime.   pantoprazole (PROTONIX) 40 MG tablet Take 1 tablet (40 mg total) by mouth daily.   pioglitazone (ACTOS) 15 MG tablet Take 1 tablet daily   sertraline (ZOLOFT) 100 MG tablet TAKE 1 TABLET BY MOUTH ONCE DAILY TAKE ALONG WITH 50MG TABLET FOR A TOTAL OF 150MG   sertraline (ZOLOFT) 50 MG tablet TAKE 1 TABLET BY MOUTH ONCE DAILY ALONG WITH 100 MG TABLET FOR A TOTAL DOSE OF 150MG. APPT NEEDED BEFORE 30 DAYS ARE UP       02/25/2022    4:09 PM 12/25/2021    4:22 PM 12/19/2021    3:43 PM 10/11/2021   11:09 AM  GAD 7 : Generalized Anxiety Score  Nervous, Anxious, on Edge 0 0 0 0  Control/stop worrying 0 0 1 0  Worry too much - different things 0 0 1 1  Trouble relaxing 0 0 0 0  Restless 0 0 0 0  Easily annoyed or irritable 0 0 1 1  Afraid - awful might happen 0 0 0 0  Total GAD 7 Score 0 0 3 2  Anxiety Difficulty Not difficult at all Not difficult at all Not difficult at all Not difficult at all       02/25/2022    4:09 PM 12/25/2021    4:22 PM 12/19/2021    3:38 PM  Depression screen PHQ 2/9  Decreased Interest 0 0 0  Down, Depressed, Hopeless 0 0 0  PHQ - 2 Score 0 0 0  Altered sleeping 0 0 0  Tired, decreased energy 0 0 0  Change in appetite 0 0 0  Feeling bad or failure about yourself  0 0 0  Trouble concentrating 0 0 0  Moving slowly or fidgety/restless 0 0 0  Suicidal thoughts 0 0 0  PHQ-9 Score 0 0 0  Difficult doing work/chores Not difficult at all Not difficult at all Not difficult at all    BP Readings from Last 3 Encounters:  02/25/22 128/68  12/25/21 110/70  12/19/21 128/74    Physical Exam Vitals and nursing note reviewed. Exam conducted with a chaperone present.  Constitutional:      General: She is not in acute distress.    Appearance: She is not diaphoretic.  HENT:     Head: Normocephalic and atraumatic.     Right Ear:  Tympanic membrane and external ear normal.     Left Ear: Tympanic membrane and external ear normal.     Nose: Nose normal. No congestion or rhinorrhea.     Mouth/Throat:     Mouth: Mucous membranes are moist.     Pharynx: No oropharyngeal exudate or posterior oropharyngeal erythema.  Eyes:     General:        Right eye: No discharge.        Left eye: No discharge.     Conjunctiva/sclera: Conjunctivae normal.     Pupils: Pupils are equal, round, and reactive to light.  Neck:     Thyroid: No thyromegaly.     Vascular: No carotid bruit or JVD.  Cardiovascular:     Rate and Rhythm: Normal rate and regular rhythm.     Heart sounds: Normal heart sounds. No murmur heard.    No friction rub. No gallop.  Pulmonary:     Effort: Pulmonary effort is normal.     Breath sounds: Normal breath sounds. No wheezing or rhonchi.  Abdominal:     General: Bowel sounds are normal.     Palpations: Abdomen is soft. There is no mass.     Tenderness: There is no abdominal tenderness. There is no guarding or rebound.  Musculoskeletal:        General: Normal range of motion.     Cervical back: Normal range of motion and neck supple.  Lymphadenopathy:     Cervical: No cervical adenopathy.  Skin:    General: Skin is warm and dry.     Capillary Refill: Capillary refill takes less than 2 seconds.  Neurological:     Mental Status: She is alert.     Deep Tendon Reflexes: Reflexes are normal and symmetric.     Wt Readings from Last 3 Encounters:  02/25/22 238 lb (108 kg)  12/25/21 235 lb (106.6 kg)  12/19/21 233 lb (105.7 kg)    BP 128/68   Pulse 82   Ht 5'  8" (1.727 m)   Wt 238 lb (108 kg)   SpO2 96%   BMI 36.19 kg/m   Assessment and Plan:  1. Type 2 diabetes mellitus without complication, without long-term current use of insulin (HCC) Chronic.  Controlled.  Stable.  Patient's had some indiscretion with her diet however we will check her A1c and renal function panel as well as microalbuminuria  at this time we will continue with Victoza 1.8 mg daily and maintain metformin 1 g twice a day and pioglitazone 15 mg daily.  Patient understands the importance of ophthalmologic exam for retinopathy and we will place appointment for this.  Foot exam was unremarkable as normal range of sensitivity and pulses. - liraglutide (VICTOZA) 18 MG/3ML SOPN; INJECT 1.8 MG INTO THE SKIN ONCE DAILY.  Dispense: 9 mL; Refill: 1 - metFORMIN (GLUCOPHAGE) 1000 MG tablet; Take 1 tablet (1,000 mg total) by mouth 2 (two) times daily.  Dispense: 180 tablet; Refill: 1 - pioglitazone (ACTOS) 15 MG tablet; Take 1 tablet daily  Dispense: 90 tablet; Refill: 1 - Renal Function Panel - HgB A1c - Microalbumin / creatinine urine ratio - Ambulatory referral to Ophthalmology    Otilio Miu, MD

## 2022-02-27 LAB — RENAL FUNCTION PANEL
Albumin: 4.5 g/dL (ref 3.8–4.9)
BUN/Creatinine Ratio: 19 (ref 9–23)
BUN: 19 mg/dL (ref 6–24)
CO2: 21 mmol/L (ref 20–29)
Calcium: 9.5 mg/dL (ref 8.7–10.2)
Chloride: 106 mmol/L (ref 96–106)
Creatinine, Ser: 1.02 mg/dL — ABNORMAL HIGH (ref 0.57–1.00)
Glucose: 154 mg/dL — ABNORMAL HIGH (ref 70–99)
Phosphorus: 3.5 mg/dL (ref 3.0–4.3)
Potassium: 4.1 mmol/L (ref 3.5–5.2)
Sodium: 141 mmol/L (ref 134–144)
eGFR: 63 mL/min/{1.73_m2} (ref >59)

## 2022-02-27 LAB — HEMOGLOBIN A1C
Est. average glucose Bld gHb Est-mCnc: 154 mg/dL
Hgb A1c MFr Bld: 7 % — ABNORMAL HIGH (ref 4.8–5.6)

## 2022-02-27 LAB — MICROALBUMIN / CREATININE URINE RATIO
Creatinine, Urine: 149.4 mg/dL
Microalb/Creat Ratio: 4 mg/g creat (ref 0–29)
Microalbumin, Urine: 5.3 ug/mL

## 2022-03-04 ENCOUNTER — Encounter: Payer: Self-pay | Admitting: Family Medicine

## 2022-03-10 ENCOUNTER — Other Ambulatory Visit: Payer: Self-pay | Admitting: Family Medicine

## 2022-03-10 DIAGNOSIS — F3341 Major depressive disorder, recurrent, in partial remission: Secondary | ICD-10-CM

## 2022-03-18 ENCOUNTER — Telehealth: Payer: BC Managed Care – PPO | Admitting: Physician Assistant

## 2022-03-18 DIAGNOSIS — R051 Acute cough: Secondary | ICD-10-CM | POA: Diagnosis not present

## 2022-03-18 DIAGNOSIS — J019 Acute sinusitis, unspecified: Secondary | ICD-10-CM | POA: Diagnosis not present

## 2022-03-18 DIAGNOSIS — B9689 Other specified bacterial agents as the cause of diseases classified elsewhere: Secondary | ICD-10-CM

## 2022-03-18 MED ORDER — DOXYCYCLINE HYCLATE 100 MG PO TABS: 100.0000 mg | ORAL_TABLET | Freq: Two times a day (BID) | ORAL | 0 refills | Status: DC

## 2022-03-18 MED ORDER — PROMETHAZINE-DM 6.25-15 MG/5ML PO SYRP: 5.0000 mL | ORAL_SOLUTION | Freq: Four times a day (QID) | ORAL | 0 refills | Status: DC | PRN

## 2022-03-18 NOTE — Progress Notes (Signed)
Virtual Visit Consent   Betty Walters, you are scheduled for a virtual visit with a [facility] provider today. Just as with appointments in the office, your consent must be obtained to participate. Your consent will be active for this visit and any virtual visit you may have with one of our providers in the next 365 days. If you have a MyChart account, a copy of this consent can be sent to you electronically.  As this is a virtual visit, video technology does not allow for your provider to perform a traditional examination. This may limit your provider's ability to fully assess your condition. If your provider identifies any concerns that need to be evaluated in person or the need to arrange testing (such as labs, EKG, etc.), we will make arrangements to do so. Although advances in technology are sophisticated, we cannot ensure that it will always work on either your end or our end. If the connection with a video visit is poor, the visit may have to be switched to a telephone visit. With either a video or telephone visit, we are not always able to ensure that we have a secure connection.  By engaging in this virtual visit, you consent to the provision of healthcare and authorize for your insurance to be billed (if applicable) for the services provided during this visit. Depending on your insurance coverage, you may receive a charge related to this service.  I need to obtain your verbal consent now. Are you willing to proceed with your visit today? RAYNEISHA BOUZA has provided verbal consent on 03/18/2022 for a virtual visit (video or telephone). Mar Daring, PA-C  Date: 03/18/2022 5:08 PM  Virtual Visit via Video Note   I, Mar Daring, connected with  Betty Walters  ([id number], [birth year]) on 03/18/22 at  5:00 PM EST by a video-enabled telemedicine application and verified that I am speaking with the correct person using two identifiers.  Location: Patient: Virtual Visit  Location Patient: Home Provider: Virtual Visit Location Provider: Home Office   I discussed the limitations of evaluation and management by telemedicine and the availability of in person appointments. The patient expressed understanding and agreed to proceed.    History of Present Illness: Betty Walters is a 61 y.o. who identifies as a female who was assigned female at birth, and is being seen today for sore throat and cough.  HPI: Sore Throat  Associated symptoms include coughing and headaches. Pertinent negatives include no ear pain or shortness of breath.  Cough This is a new problem. The current episode started 1 to 4 weeks ago (1.5 weeks). The problem has been gradually worsening. The problem occurs every few minutes. The cough is Non-productive. Associated symptoms include ear congestion (some), headaches, myalgias, nasal congestion, postnasal drip, rhinorrhea and a sore throat (started last night). Pertinent negatives include no chills, ear pain, fever, shortness of breath or wheezing. The symptoms are aggravated by lying down. Treatments tried: dayquil, mucinex, alka seltzer cold and flu gel tabs. The treatment provided no relief. Her past medical history is significant for bronchitis and pneumonia. There is no history of asthma.     Problems:  Patient Active Problem List   Diagnosis Date Noted   Acute pyelonephritis 12/10/2021   AKI (acute kidney injury) ([place]) 12/10/2021   Type 2 diabetes mellitus with peripheral neuropathy ([place]) 12/10/2021   Dyslipidemia 12/10/2021   Depression 12/10/2021   GERD without esophagitis 12/10/2021   Sprain, metatarsophalangeal joint 01/26/2018  Heartburn    Dyspepsia    Change in bowel habits    Benign neoplasm of descending colon    Chronic pain of toes of both feet 09/20/2016   Type 2 diabetes mellitus with complication ([place]) 22/63/3354   Allergic rhinitis due to pollen 06/21/2015   Concussion with coma 10/21/2014   Clinical depression  09/28/2014   Diabetes mellitus, type 2 ([place]) 09/28/2014   HLD (hyperlipidemia) 09/28/2014   Essential hypertension 09/28/2014   Adiposity 09/28/2014    Allergies:  Allergies  Allergen Reactions   Amoxicillin-Pot Clavulanate Nausea And Vomiting    (OK with Amoxicillin alone)   Medications:  Current Outpatient Medications:    celecoxib (CELEBREX) 100 MG capsule, Take 1 capsule (100 mg total) by mouth 2 (two) times daily., Disp: 120 capsule, Rfl: 1   cyclobenzaprine (FLEXERIL) 5 MG tablet, Take 1 tablet (5 mg total) by mouth at bedtime as needed., Disp: 30 tablet, Rfl: 5   dicyclomine (BENTYL) 20 MG tablet, TAKE 1 TABLET BY MOUTH THREE TIMES DAILY BEFORE MEAL(S), Disp: 90 tablet, Rfl: 10   doxycycline (VIBRA-TABS) 100 MG tablet, Take 1 tablet (100 mg total) by mouth 2 (two) times daily., Disp: 20 tablet, Rfl: 0   gabapentin (NEURONTIN) 300 MG capsule, TAKE 1 CAPSULE BY MOUTH THREE TIMES DAILY, Disp: 90 capsule, Rfl: 0   Insulin Pen Needle (PEN NEEDLES 3/16") 31G X 5 MM MISC, 1 each by Does not apply route daily., Disp: 100 each, Rfl: 0   liraglutide (VICTOZA) 18 MG/3ML SOPN, INJECT 1.8 MG INTO THE SKIN ONCE DAILY., Disp: 9 mL, Rfl: 1   lisinopril (ZESTRIL) 20 MG tablet, Take 1 tablet (20 mg total) by mouth daily., Disp: 90 tablet, Rfl: 1   lovastatin (MEVACOR) 40 MG tablet, Take 1 tablet dailyTAKE 1 TABLET BY MOUTH ONCE DAILY., Disp: 90 tablet, Rfl: 1   metFORMIN (GLUCOPHAGE) 1000 MG tablet, Take 1 tablet (1,000 mg total) by mouth 2 (two) times daily., Disp: 180 tablet, Rfl: 1   montelukast (SINGULAIR) 10 MG tablet, Take 1 tablet (10 mg total) by mouth at bedtime., Disp: 30 tablet, Rfl: 3   pantoprazole (PROTONIX) 40 MG tablet, Take 1 tablet (40 mg total) by mouth daily., Disp: 90 tablet, Rfl: 2   pioglitazone (ACTOS) 15 MG tablet, Take 1 tablet daily, Disp: 90 tablet, Rfl: 1   promethazine-dextromethorphan (PROMETHAZINE-DM) 6.25-15 MG/5ML syrup, Take 5 mLs by mouth 4 (four) times daily as  needed., Disp: 118 mL, Rfl: 0   sertraline (ZOLOFT) 100 MG tablet, TAKE 1 TABLET BY MOUTH ONCE DAILY (ALONG  WITH  '50MG'$   TAB), Disp: 90 tablet, Rfl: 0   sertraline (ZOLOFT) 50 MG tablet, TAKE 1 TABLET BY MOUTH ONCE DAILY (ALONG  WITH  '100MG'$   TABS)  -  NEED  APPT  BEFORE  30  DAYS  ARE  UP, Disp: 90 tablet, Rfl: 0  Observations/Objective: Patient is well-developed, well-nourished in no acute distress.  Resting comfortably at home.  Head is normocephalic, atraumatic.  No labored breathing.  Speech is clear and coherent with logical content.  Patient is alert and oriented at baseline.    Assessment and Plan: 1. Acute bacterial sinusitis - doxycycline (VIBRA-TABS) 100 MG tablet; Take 1 tablet (100 mg total) by mouth 2 (two) times daily.  Dispense: 20 tablet; Refill: 0  2. Acute cough - promethazine-dextromethorphan (PROMETHAZINE-DM) 6.25-15 MG/5ML syrup; Take 5 mLs by mouth 4 (four) times daily as needed.  Dispense: 118 mL; Refill: 0  - Worsening symptoms that have  not responded to OTC medications.  - Will give Doxycycline and Promethazine DM - Continue Mucinex (plain) - Steam and humidifier can help - Stay well hydrated and get plenty of rest.  - Seek in person evaluation if no symptom improvement or if symptoms worsen   Follow Up Instructions: I discussed the assessment and treatment plan with the patient. The patient was provided an opportunity to ask questions and all were answered. The patient agreed with the plan and demonstrated an understanding of the instructions.  A copy of instructions were sent to the patient via MyChart unless otherwise noted below.    The patient was advised to call back or seek an in-person evaluation if the symptoms worsen or if the condition fails to improve as anticipated.  Time:  I spent 8 minutes with the patient via telehealth technology discussing the above problems/concerns.    Mar Daring, PA-C

## 2022-03-18 NOTE — Patient Instructions (Signed)
Kirk Ruths, thank you for joining Mar Daring, PA-C for today's virtual visit.  While this provider is not your primary care provider (PCP), if your PCP is located in our provider database this encounter information will be shared with them immediately following your visit.   [place] account gives you access to today's visit and all your visits, tests, and labs performed at [facility] " click here if you don't have a [facility] account or go to [email or web address]  Consent: (Patient) Analiya Porco Fleury provided verbal consent for this virtual visit at the beginning of the encounter.  Current Medications:  Current Outpatient Medications:    doxycycline (VIBRA-TABS) 100 MG tablet, Take 1 tablet (100 mg total) by mouth 2 (two) times daily., Disp: 20 tablet, Rfl: 0   promethazine-dextromethorphan (PROMETHAZINE-DM) 6.25-15 MG/5ML syrup, Take 5 mLs by mouth 4 (four) times daily as needed., Disp: 118 mL, Rfl: 0   celecoxib (CELEBREX) 100 MG capsule, Take 1 capsule (100 mg total) by mouth 2 (two) times daily., Disp: 120 capsule, Rfl: 1   cyclobenzaprine (FLEXERIL) 5 MG tablet, Take 1 tablet (5 mg total) by mouth at bedtime as needed., Disp: 30 tablet, Rfl: 5   dicyclomine (BENTYL) 20 MG tablet, TAKE 1 TABLET BY MOUTH THREE TIMES DAILY BEFORE MEAL(S), Disp: 90 tablet, Rfl: 10   gabapentin (NEURONTIN) 300 MG capsule, TAKE 1 CAPSULE BY MOUTH THREE TIMES DAILY, Disp: 90 capsule, Rfl: 0   Insulin Pen Needle (PEN NEEDLES 3/16") 31G X 5 MM MISC, 1 each by Does not apply route daily., Disp: 100 each, Rfl: 0   liraglutide (VICTOZA) 18 MG/3ML SOPN, INJECT 1.8 MG INTO THE SKIN ONCE DAILY., Disp: 9 mL, Rfl: 1   lisinopril (ZESTRIL) 20 MG tablet, Take 1 tablet (20 mg total) by mouth daily., Disp: 90 tablet, Rfl: 1   lovastatin (MEVACOR) 40 MG tablet, Take 1 tablet dailyTAKE 1 TABLET BY MOUTH ONCE DAILY., Disp: 90 tablet, Rfl: 1   metFORMIN (GLUCOPHAGE) 1000 MG  tablet, Take 1 tablet (1,000 mg total) by mouth 2 (two) times daily., Disp: 180 tablet, Rfl: 1   montelukast (SINGULAIR) 10 MG tablet, Take 1 tablet (10 mg total) by mouth at bedtime., Disp: 30 tablet, Rfl: 3   pantoprazole (PROTONIX) 40 MG tablet, Take 1 tablet (40 mg total) by mouth daily., Disp: 90 tablet, Rfl: 2   pioglitazone (ACTOS) 15 MG tablet, Take 1 tablet daily, Disp: 90 tablet, Rfl: 1   sertraline (ZOLOFT) 100 MG tablet, TAKE 1 TABLET BY MOUTH ONCE DAILY (ALONG  WITH  '50MG'$   TAB), Disp: 90 tablet, Rfl: 0   sertraline (ZOLOFT) 50 MG tablet, TAKE 1 TABLET BY MOUTH ONCE DAILY (ALONG  WITH  '100MG'$   TABS)  -  NEED  APPT  BEFORE  30  DAYS  ARE  UP, Disp: 90 tablet, Rfl: 0   Medications ordered in this encounter:  Meds ordered this encounter  Medications   doxycycline (VIBRA-TABS) 100 MG tablet    Sig: Take 1 tablet (100 mg total) by mouth 2 (two) times daily.    Dispense:  20 tablet    Refill:  0    Order Specific Question:   Supervising Provider    Answer:   Chase Picket [id number]   promethazine-dextromethorphan (PROMETHAZINE-DM) 6.25-15 MG/5ML syrup    Sig: Take 5 mLs by mouth 4 (four) times daily as needed.    Dispense:  118 mL    Refill:  0  Order Specific Question:   Supervising Provider    Answer:   Chase Picket [[id number]]     *If you need refills on other medications prior to your next appointment, please contact your pharmacy*  Follow-Up: Call back or seek an in-person evaluation if the symptoms worsen or if the condition fails to improve as anticipated.  [place] [phone]  Other Instructions Sinus Infection, Adult A sinus infection, also called sinusitis, is inflammation of your sinuses. Sinuses are hollow spaces in the bones around your face. Your sinuses are located: Around your eyes. In the middle of your forehead. Behind your nose. In your cheekbones. Mucus normally drains out of your sinuses. When your nasal tissues become  inflamed or swollen, mucus can become trapped or blocked. This allows bacteria, viruses, and fungi to grow, which leads to infection. Most infections of the sinuses are caused by a virus. A sinus infection can develop quickly. It can last for up to 4 weeks (acute) or for more than 12 weeks (chronic). A sinus infection often develops after a cold. What are the causes? This condition is caused by anything that creates swelling in the sinuses or stops mucus from draining. This includes: Allergies. Asthma. Infection from bacteria or viruses. Deformities or blockages in your nose or sinuses. Abnormal growths in the nose (nasal polyps). Pollutants, such as chemicals or irritants in the air. Infection from fungi. This is rare. What increases the risk? You are more likely to develop this condition if you: Have a weak body defense system (immune system). Do a lot of swimming or diving. Overuse nasal sprays. Smoke. What are the signs or symptoms? The main symptoms of this condition are pain and a feeling of pressure around the affected sinuses. Other symptoms include: Stuffy nose or congestion that makes it difficult to breathe through your nose. Thick yellow or greenish drainage from your nose. Tenderness, swelling, and warmth over the affected sinuses. A cough that may get worse at night. Decreased sense of smell and taste. Extra mucus that collects in the throat or the back of the nose (postnasal drip) causing a sore throat or bad breath. Tiredness (fatigue). Fever. How is this diagnosed? This condition is diagnosed based on: Your symptoms. Your medical history. A physical exam. Tests to find out if your condition is acute or chronic. This may include: Checking your nose for nasal polyps. Viewing your sinuses using a device that has a light (endoscope). Testing for allergies or bacteria. Imaging tests, such as an MRI or CT scan. In rare cases, a bone biopsy may be done to rule out more  serious types of fungal sinus disease. How is this treated? Treatment for a sinus infection depends on the cause and whether your condition is chronic or acute. If caused by a virus, your symptoms should go away on their own within 10 days. You may be given medicines to relieve symptoms. They include: Medicines that shrink swollen nasal passages (decongestants). A spray that eases inflammation of the nostrils (topical intranasal corticosteroids). Rinses that help get rid of thick mucus in your nose (nasal saline washes). Medicines that treat allergies (antihistamines). Over-the-counter pain relievers. If caused by bacteria, your health care provider may recommend waiting to see if your symptoms improve. Most bacterial infections will get better without antibiotic medicine. You may be given antibiotics if you have: A severe infection. A weak immune system. If caused by narrow nasal passages or nasal polyps, surgery may be needed. Follow these instructions  at home: Medicines Take, use, or apply over-the-counter and prescription medicines only as told by your health care provider. These may include nasal sprays. If you were prescribed an antibiotic medicine, take it as told by your health care provider. Do not stop taking the antibiotic even if you start to feel better. Hydrate and humidify  Drink enough fluid to keep your urine pale yellow. Staying hydrated will help to thin your mucus. Use a cool mist humidifier to keep the humidity level in your home above 50%. Inhale steam for 10-15 minutes, 3-4 times a day, or as told by your health care provider. You can do this in the bathroom while a hot shower is running. Limit your exposure to cool or dry air. Rest Rest as much as possible. Sleep with your head raised (elevated). Make sure you get enough sleep each night. General instructions  Apply a warm, moist washcloth to your face 3-4 times a day or as told by your health care provider. This  will help with discomfort. Use nasal saline washes as often as told by your health care provider. Wash your hands often with soap and water to reduce your exposure to germs. If soap and water are not available, use hand sanitizer. Do not smoke. Avoid being around people who are smoking (secondhand smoke). Keep all follow-up visits. This is important. Contact a health care provider if: You have a fever. Your symptoms get worse. Your symptoms do not improve within 10 days. Get help right away if: You have a severe headache. You have persistent vomiting. You have severe pain or swelling around your face or eyes. You have vision problems. You develop confusion. Your neck is stiff. You have trouble breathing. These symptoms may be an emergency. Get help right away. Call 911. Do not wait to see if the symptoms will go away. Do not drive yourself to the hospital. Summary A sinus infection is soreness and inflammation of your sinuses. Sinuses are hollow spaces in the bones around your face. This condition is caused by nasal tissues that become inflamed or swollen. The swelling traps or blocks the flow of mucus. This allows bacteria, viruses, and fungi to grow, which leads to infection. If you were prescribed an antibiotic medicine, take it as told by your health care provider. Do not stop taking the antibiotic even if you start to feel better. Keep all follow-up visits. This is important. This information is not intended to replace advice given to you by your health care provider. Make sure you discuss any questions you have with your health care provider. Document Revised: 02/20/2021 Document Reviewed: 02/20/2021 Elsevier Patient Education  [place].    If you have been instructed to have an in-person evaluation today at a local Urgent Care facility, please use the link below. It will take you to a list of all of our available [facility], including address, phone  number and hours of operation. Please do not delay care.  [facility]  If you or a family member do not have a primary care provider, use the link below to schedule a visit and establish care. When you choose a [facility] primary care physician or advanced practice provider, you gain a long-term partner in health. Find a Primary Care Provider  Learn more about 's in-office and virtual care options: [facility]

## 2022-03-29 ENCOUNTER — Telehealth: Payer: Self-pay | Admitting: Podiatry

## 2022-03-29 ENCOUNTER — Other Ambulatory Visit: Payer: Self-pay | Admitting: Podiatry

## 2022-03-29 NOTE — Telephone Encounter (Signed)
Pt called in to request a RX refill on Gabapentin medication. Please advise

## 2022-03-29 NOTE — Telephone Encounter (Signed)
Refill approved.  Thanks, Dr. Amalia Hailey

## 2022-04-03 LAB — HM DIABETES EYE EXAM

## 2022-04-16 ENCOUNTER — Telehealth: Payer: Self-pay

## 2022-04-16 ENCOUNTER — Ambulatory Visit: Payer: Self-pay

## 2022-04-16 NOTE — Telephone Encounter (Signed)
Spoke to pt on phone- she does not want to take paxlovid if she doesn't have to. We advised her to take mucinex D and flonase for the stuffiness in her nose. See how she feels tomorrow afternoon and if she can't say she feels a little better, start the paxlovid. 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 Pt voiced understanding

## 2022-04-16 NOTE — Telephone Encounter (Signed)
  Chief Complaint: Medication question Symptoms: Covid s/s Frequency: Sunday Pertinent Negatives: Patient denies  Disposition: '[]'$ ED /'[]'$ Urgent Care (no appt availability in office) / '[]'$ Appointment(In office/virtual)/ '[]'$  [facility]/ '[]'$ Home Care/ '[]'$ Refused Recommended Disposition /'[]'$ [facility]/ '[x]'$  Follow-up with PCP Additional Notes: PT diagnosed with COVID yesterday at [facility] and given paxlovid.  Pt wants to make sure it is ok to take this medication considering other health issues and prescriptions.   Pt also wants to know if she needs to take this medication. Pt has read the side effects and is concerned.   PT would like a call back letting her know if it is ok to take this medication and if she should take the medication.    Summary: Medication question   Patient states that she went to Urgent Care yesterday and was diagnosed with Covid and was prescribed Paxlovid '300mg'$  2xday for 5 days. Patient is wanting to know if it is safe for her to take since she has other health issues.  Please advise     Reason for Disposition  [1] Caller has URGENT medicine question about med that PCP or specialist prescribed AND [2] triager unable to answer question  Answer Assessment - Initial Assessment Questions 1. NAME of MEDICINE: "What medicine(s) are you calling about?"     Paxlovid 2. QUESTION: "What is your question?" (e.g., double dose of medicine, side effect)     Can pt safely take this medication 3. PRESCRIBER: "Who prescribed the medicine?" Reason: if prescribed by specialist, call should be referred to that group.      4. SYMPTOMS: "Do you have any symptoms?" If Yes, ask: "What symptoms are you having?"  "How bad are the symptoms (e.g., mild, moderate, severe)     Covid  5. PREGNANCY:  "Is there any chance that you are pregnant?" "When was your last menstrual period?"  Protocols used: Medication Question Call-A-AH

## 2022-05-05 ENCOUNTER — Telehealth: Payer: BC Managed Care – PPO | Admitting: Urgent Care

## 2022-05-05 DIAGNOSIS — J029 Acute pharyngitis, unspecified: Secondary | ICD-10-CM

## 2022-05-05 MED ORDER — PENICILLIN V POTASSIUM 500 MG PO TABS: 500.0000 mg | ORAL_TABLET | Freq: Two times a day (BID) | ORAL | 0 refills | Status: DC

## 2022-05-05 NOTE — Progress Notes (Signed)
Virtual Visit Consent   Betty Walters, you are scheduled for a virtual visit with a [facility] provider today. Just as with appointments in the office, your consent must be obtained to participate. Your consent will be active for this visit and any virtual visit you may have with one of our providers in the next 365 days. If you have a MyChart account, a copy of this consent can be sent to you electronically.  As this is a virtual visit, video technology does not allow for your provider to perform a traditional examination. This may limit your provider's ability to fully assess your condition. If your provider identifies any concerns that need to be evaluated in person or the need to arrange testing (such as labs, EKG, etc.), we will make arrangements to do so. Although advances in technology are sophisticated, we cannot ensure that it will always work on either your end or our end. If the connection with a video visit is poor, the visit may have to be switched to a telephone visit. With either a video or telephone visit, we are not always able to ensure that we have a secure connection.  By engaging in this virtual visit, you consent to the provision of healthcare and authorize for your insurance to be billed (if applicable) for the services provided during this visit. Depending on your insurance coverage, you may receive a charge related to this service.  I need to obtain your verbal consent now. Are you willing to proceed with your visit today? Betty Walters has provided verbal consent on 05/05/2022 for a virtual visit (video or telephone). Chaney Malling, PA  Date: 05/05/2022 3:45 PM  Virtual Visit via Video Note   I, [place], connected with  Betty Walters  ([id number], 1960-06-09) on 05/05/22 at  3:30 PM EST by a video-enabled telemedicine application and verified that I am speaking with the correct person using two identifiers.  Location: Patient: Virtual Visit Location Patient:  passenger seat of car Provider: Virtual Visit Location Provider: Home Office   I discussed the limitations of evaluation and management by telemedicine and the availability of in person appointments. The patient expressed understanding and agreed to proceed.    History of Present Illness: Betty Walters is a 62 y.o. who identifies as a female who was assigned female at birth, and is being seen today for sore throat.  HPI: 62yo female presents today with concerns of sore throat. Pt was just diagnosed with Covid on 04/15/22. Did not take the paxlovid. Felt like sx resolved, but then last night started having sinus drainage and a very severe sore throat. 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 Has been hocking up yellow thick mucous. She reports significant personal hx of strep throat in the past and states this feels similar. Reports the feeling of something stuck in her R tonsil, extremely painful to swallow. Denies dysphagia or drooling. No trismus. She is uncertain if she has had a fever but states her lymph nodes are very swollen and painful. Looked at her throat and states it was extremely red with white spots, denies ulcerations.    Problems:  Patient Active Problem List   Diagnosis Date Noted   Acute pyelonephritis 12/10/2021   AKI (acute kidney injury) ([place]) 12/10/2021   Type 2 diabetes mellitus with peripheral neuropathy ([place]) 12/10/2021   Dyslipidemia 12/10/2021   Depression 12/10/2021   GERD without esophagitis 12/10/2021  Sprain, metatarsophalangeal joint 01/26/2018   Heartburn    Dyspepsia    Change in bowel habits    Benign neoplasm of descending colon    Chronic pain of toes of both feet 09/20/2016   Type 2 diabetes mellitus with complication ([place]) 63/84/6659   Allergic rhinitis due to pollen 06/21/2015   Concussion with coma 10/21/2014   Clinical depression 09/28/2014   Diabetes mellitus, type 2 ([place])  09/28/2014   HLD (hyperlipidemia) 09/28/2014   Essential hypertension 09/28/2014   Adiposity 09/28/2014    Allergies:  Allergies  Allergen Reactions   Amoxicillin-Pot Clavulanate Nausea And Vomiting    (OK with Amoxicillin alone)   Medications:  Current Outpatient Medications:    penicillin v potassium (VEETID) 500 MG tablet, Take 1 tablet (500 mg total) by mouth in the morning and at bedtime for 10 days., Disp: 20 tablet, Rfl: 0   celecoxib (CELEBREX) 100 MG capsule, Take 1 capsule (100 mg total) by mouth 2 (two) times daily., Disp: 120 capsule, Rfl: 1   cyclobenzaprine (FLEXERIL) 5 MG tablet, Take 1 tablet (5 mg total) by mouth at bedtime as needed., Disp: 30 tablet, Rfl: 5   dicyclomine (BENTYL) 20 MG tablet, TAKE 1 TABLET BY MOUTH THREE TIMES DAILY BEFORE MEAL(S), Disp: 90 tablet, Rfl: 10   gabapentin (NEURONTIN) 300 MG capsule, TAKE 1 CAPSULE BY MOUTH THREE TIMES DAILY, Disp: 90 capsule, Rfl: 0   Insulin Pen Needle (PEN NEEDLES 3/16") 31G X 5 MM MISC, 1 each by Does not apply route daily., Disp: 100 each, Rfl: 0   liraglutide (VICTOZA) 18 MG/3ML SOPN, INJECT 1.8 MG INTO THE SKIN ONCE DAILY., Disp: 9 mL, Rfl: 1   lisinopril (ZESTRIL) 20 MG tablet, Take 1 tablet (20 mg total) by mouth daily., Disp: 90 tablet, Rfl: 1   lovastatin (MEVACOR) 40 MG tablet, Take 1 tablet dailyTAKE 1 TABLET BY MOUTH ONCE DAILY., Disp: 90 tablet, Rfl: 1   metFORMIN (GLUCOPHAGE) 1000 MG tablet, Take 1 tablet (1,000 mg total) by mouth 2 (two) times daily., Disp: 180 tablet, Rfl: 1   montelukast (SINGULAIR) 10 MG tablet, Take 1 tablet (10 mg total) by mouth at bedtime., Disp: 30 tablet, Rfl: 3   pantoprazole (PROTONIX) 40 MG tablet, Take 1 tablet (40 mg total) by mouth daily., Disp: 90 tablet, Rfl: 2   pioglitazone (ACTOS) 15 MG tablet, Take 1 tablet daily, Disp: 90 tablet, Rfl: 1   promethazine-dextromethorphan (PROMETHAZINE-DM) 6.25-15 MG/5ML syrup, Take 5 mLs by mouth 4 (four) times daily as needed., Disp: 118  mL, Rfl: 0   sertraline (ZOLOFT) 100 MG tablet, TAKE 1 TABLET BY MOUTH ONCE DAILY (ALONG  WITH  '50MG'$   TAB), Disp: 90 tablet, Rfl: 0   sertraline (ZOLOFT) 50 MG tablet, TAKE 1 TABLET BY MOUTH ONCE DAILY (ALONG  WITH  '100MG'$   TABS)  -  NEED  APPT  BEFORE  30  DAYS  ARE  UP, Disp: 90 tablet, Rfl: 0  Observations/Objective: Patient is well-developed, well-nourished in no acute distress.  Resting comfortably in passenger seat of car.  Head is normocephalic, atraumatic.  No labored breathing. No cough during exam Speech is clear and coherent with logical content.  Patient is alert and oriented at baseline.  TTP submandibular lymph nodes Erythema noted to posterior pharynx, Tonsillar hypertrophy with minimal exudates, R>L; no evidence of PTA or cellulitis  Assessment and Plan: 1. Pharyngitis, unspecified etiology  Pt with longstanding hx of strep throat. Sx started abruptly after cheer competition, pt concerned about possible  exposures there. Also recently had covid, therefore immunity possibly lower than baseline. Will start PCN VK (pt cannot tolerate Augmentin due to GI side effects but states plain PCNs okay). Encouraged mucinex and salt water gargles as well.  Follow Up Instructions: I discussed the assessment and treatment plan with the patient. The patient was provided an opportunity to ask questions and all were answered. The patient agreed with the plan and demonstrated an understanding of the instructions.  A copy of instructions were sent to the patient via MyChart unless otherwise noted below.    The patient was advised to call back or seek an in-person evaluation if the symptoms worsen or if the condition fails to improve as anticipated.  Time:  I spent 7 minutes with the patient via telehealth technology discussing the above problems/concerns.    [place], PA

## 2022-05-05 NOTE — Patient Instructions (Signed)
Kirk Ruths, thank you for joining Chaney Malling, PA for today's virtual visit.  While this provider is not your primary care provider (PCP), if your PCP is located in our provider database this encounter information will be shared with them immediately following your visit.   [place] account gives you access to today's visit and all your visits, tests, and labs performed at [facility] " click here if you don't have a [place] account or go to [email or web address]  Consent: (Patient) Jeanell Mangan Arrellano provided verbal consent for this virtual visit at the beginning of the encounter.  Current Medications:  Current Outpatient Medications:    penicillin v potassium (VEETID) 500 MG tablet, Take 1 tablet (500 mg total) by mouth in the morning and at bedtime for 10 days., Disp: 20 tablet, Rfl: 0   celecoxib (CELEBREX) 100 MG capsule, Take 1 capsule (100 mg total) by mouth 2 (two) times daily., Disp: 120 capsule, Rfl: 1   cyclobenzaprine (FLEXERIL) 5 MG tablet, Take 1 tablet (5 mg total) by mouth at bedtime as needed., Disp: 30 tablet, Rfl: 5   dicyclomine (BENTYL) 20 MG tablet, TAKE 1 TABLET BY MOUTH THREE TIMES DAILY BEFORE MEAL(S), Disp: 90 tablet, Rfl: 10   gabapentin (NEURONTIN) 300 MG capsule, TAKE 1 CAPSULE BY MOUTH THREE TIMES DAILY, Disp: 90 capsule, Rfl: 0   Insulin Pen Needle (PEN NEEDLES 3/16") 31G X 5 MM MISC, 1 each by Does not apply route daily., Disp: 100 each, Rfl: 0   liraglutide (VICTOZA) 18 MG/3ML SOPN, INJECT 1.8 MG INTO THE SKIN ONCE DAILY., Disp: 9 mL, Rfl: 1   lisinopril (ZESTRIL) 20 MG tablet, Take 1 tablet (20 mg total) by mouth daily., Disp: 90 tablet, Rfl: 1   lovastatin (MEVACOR) 40 MG tablet, Take 1 tablet dailyTAKE 1 TABLET BY MOUTH ONCE DAILY., Disp: 90 tablet, Rfl: 1   metFORMIN (GLUCOPHAGE) 1000 MG tablet, Take 1 tablet (1,000 mg total) by mouth 2 (two) times daily., Disp: 180 tablet, Rfl: 1   montelukast (SINGULAIR) 10 MG  tablet, Take 1 tablet (10 mg total) by mouth at bedtime., Disp: 30 tablet, Rfl: 3   pantoprazole (PROTONIX) 40 MG tablet, Take 1 tablet (40 mg total) by mouth daily., Disp: 90 tablet, Rfl: 2   pioglitazone (ACTOS) 15 MG tablet, Take 1 tablet daily, Disp: 90 tablet, Rfl: 1   promethazine-dextromethorphan (PROMETHAZINE-DM) 6.25-15 MG/5ML syrup, Take 5 mLs by mouth 4 (four) times daily as needed., Disp: 118 mL, Rfl: 0   sertraline (ZOLOFT) 100 MG tablet, TAKE 1 TABLET BY MOUTH ONCE DAILY (ALONG  WITH  '50MG'$   TAB), Disp: 90 tablet, Rfl: 0   sertraline (ZOLOFT) 50 MG tablet, TAKE 1 TABLET BY MOUTH ONCE DAILY (ALONG  WITH  '100MG'$   TABS)  -  NEED  APPT  BEFORE  30  DAYS  ARE  UP, Disp: 90 tablet, Rfl: 0   Medications ordered in this encounter:  Meds ordered this encounter  Medications   penicillin v potassium (VEETID) 500 MG tablet    Sig: Take 1 tablet (500 mg total) by mouth in the morning and at bedtime for 10 days.    Dispense:  20 tablet    Refill:  0    Order Specific Question:   Supervising Provider    Answer:   Chase Picket [id number]     *If you need refills on other medications prior to your next appointment, please contact your pharmacy*  Follow-Up: Call back  or seek an in-person evaluation if the symptoms worsen or if the condition fails to improve as anticipated.  [place] [phone]  Other Instructions Start the PCN VK twice daily, best taken on an empty stomach. Take until completed, do not stop them because you are feeling better. Take an OTC probiotic such as culturelle or Florastor, to prevent vaginal yeast infection. Use salt water gargles. Hot honey tea may help. Throw away your current toothbrush after completing the first three days of antibiotics. Use mucinex '600mg'$  twice daily with plenty of water to help break up any phlegm or mucous. Follow up with PCP or be seen at an in person urgent care should your symptoms persist or worsen.   If you  have been instructed to have an in-person evaluation today at a local Urgent Care facility, please use the link below. It will take you to a list of all of our available [facility], including address, phone number and hours of operation. Please do not delay care.  [facility]  If you or a family member do not have a primary care provider, use the link below to schedule a visit and establish care. When you choose a [facility] primary care physician or advanced practice provider, you gain a long-term partner in health. Find a Primary Care Provider  Learn more about [facility]'s in-office and virtual care options: [facility]

## 2022-05-14 ENCOUNTER — Telehealth: Payer: BC Managed Care – PPO | Admitting: Physician Assistant

## 2022-05-14 DIAGNOSIS — J019 Acute sinusitis, unspecified: Secondary | ICD-10-CM | POA: Diagnosis not present

## 2022-05-14 DIAGNOSIS — B9689 Other specified bacterial agents as the cause of diseases classified elsewhere: Secondary | ICD-10-CM | POA: Diagnosis not present

## 2022-05-14 DIAGNOSIS — H9201 Otalgia, right ear: Secondary | ICD-10-CM

## 2022-05-14 MED ORDER — DOXYCYCLINE HYCLATE 100 MG PO TABS: 100.0000 mg | ORAL_TABLET | Freq: Two times a day (BID) | ORAL | 0 refills | Status: DC

## 2022-05-14 NOTE — Progress Notes (Signed)
Virtual Visit Consent   Betty Walters, you are scheduled for a virtual visit with a [facility] provider today. Just as with appointments in the office, your consent must be obtained to participate. Your consent will be active for this visit and any virtual visit you may have with one of our providers in the next 365 days. If you have a MyChart account, a copy of this consent can be sent to you electronically.  As this is a virtual visit, video technology does not allow for your provider to perform a traditional examination. This may limit your provider's ability to fully assess your condition. If your provider identifies any concerns that need to be evaluated in person or the need to arrange testing (such as labs, EKG, etc.), we will make arrangements to do so. Although advances in technology are sophisticated, we cannot ensure that it will always work on either your end or our end. If the connection with a video visit is poor, the visit may have to be switched to a telephone visit. With either a video or telephone visit, we are not always able to ensure that we have a secure connection.  By engaging in this virtual visit, you consent to the provision of healthcare and authorize for your insurance to be billed (if applicable) for the services provided during this visit. Depending on your insurance coverage, you may receive a charge related to this service.  I need to obtain your verbal consent now. Are you willing to proceed with your visit today? Betty Walters has provided verbal consent on 05/14/2022 for a virtual visit (video or telephone). Betty Walters, [place]  Date: 05/14/2022 6:13 PM  Virtual Visit via Video Note   I, Betty Walters, connected with  Betty Walters  ([id number], 1960/10/03) on 05/14/22 at  6:00 PM EST by a video-enabled telemedicine application and verified that I am speaking with the correct person using two identifiers.  Location: Patient: Virtual Visit Location  Patient: Home Provider: Virtual Visit Location Provider: Home Office   I discussed the limitations of evaluation and management by telemedicine and the availability of in person appointments. The patient expressed understanding and agreed to proceed.    History of Present Illness: Betty Walters is a 62 y.o. who identifies as a female who was assigned female at birth, and is being seen today for ongoing nasal and head congestion now with R maxillary sinus pain and constant and progressing R ear pain. Is currently on Penicillin for suspected bacterial pharyngitis and noting these symptoms have resolved. Denies fever, chills, chest congestion or cough. Is taking OTC Mucinex.   HPI: HPI  Problems:  Patient Active Problem List   Diagnosis Date Noted   Acute pyelonephritis 12/10/2021   AKI (acute kidney injury) ([place]) 12/10/2021   Type 2 diabetes mellitus with peripheral neuropathy ([place]) 12/10/2021   Dyslipidemia 12/10/2021   Depression 12/10/2021   GERD without esophagitis 12/10/2021   Sprain, metatarsophalangeal joint 01/26/2018   Heartburn    Dyspepsia    Change in bowel habits    Benign neoplasm of descending colon    Chronic pain of toes of both feet 09/20/2016   Type 2 diabetes mellitus with complication ([place]) 0000000   Allergic rhinitis due to pollen 06/21/2015   Concussion with coma 10/21/2014   Clinical depression 09/28/2014   Diabetes mellitus, type 2 ([place]) 09/28/2014   HLD (hyperlipidemia) 09/28/2014   Essential hypertension 09/28/2014   Adiposity 09/28/2014    Allergies:  Allergies  Allergen Reactions   Amoxicillin-Pot Clavulanate Nausea And Vomiting    (OK with Amoxicillin alone)   Medications:  Current Outpatient Medications:    doxycycline (VIBRA-TABS) 100 MG tablet, Take 1 tablet (100 mg total) by mouth 2 (two) times daily., Disp: 20 tablet, Rfl: 0   celecoxib (CELEBREX) 100 MG capsule, Take 1 capsule (100 mg total) by mouth 2 (two) times daily., Disp: 120  capsule, Rfl: 1   cyclobenzaprine (FLEXERIL) 5 MG tablet, Take 1 tablet (5 mg total) by mouth at bedtime as needed., Disp: 30 tablet, Rfl: 5   dicyclomine (BENTYL) 20 MG tablet, TAKE 1 TABLET BY MOUTH THREE TIMES DAILY BEFORE MEAL(S), Disp: 90 tablet, Rfl: 10   gabapentin (NEURONTIN) 300 MG capsule, TAKE 1 CAPSULE BY MOUTH THREE TIMES DAILY, Disp: 90 capsule, Rfl: 0   Insulin Pen Needle (PEN NEEDLES 3/16") 31G X 5 MM MISC, 1 each by Does not apply route daily., Disp: 100 each, Rfl: 0   liraglutide (VICTOZA) 18 MG/3ML SOPN, INJECT 1.8 MG INTO THE SKIN ONCE DAILY., Disp: 9 mL, Rfl: 1   lisinopril (ZESTRIL) 20 MG tablet, Take 1 tablet (20 mg total) by mouth daily., Disp: 90 tablet, Rfl: 1   lovastatin (MEVACOR) 40 MG tablet, Take 1 tablet dailyTAKE 1 TABLET BY MOUTH ONCE DAILY., Disp: 90 tablet, Rfl: 1   metFORMIN (GLUCOPHAGE) 1000 MG tablet, Take 1 tablet (1,000 mg total) by mouth 2 (two) times daily., Disp: 180 tablet, Rfl: 1   montelukast (SINGULAIR) 10 MG tablet, Take 1 tablet (10 mg total) by mouth at bedtime., Disp: 30 tablet, Rfl: 3   pantoprazole (PROTONIX) 40 MG tablet, Take 1 tablet (40 mg total) by mouth daily., Disp: 90 tablet, Rfl: 2   pioglitazone (ACTOS) 15 MG tablet, Take 1 tablet daily, Disp: 90 tablet, Rfl: 1   sertraline (ZOLOFT) 100 MG tablet, TAKE 1 TABLET BY MOUTH ONCE DAILY (ALONG  WITH  50MG  TAB), Disp: 90 tablet, Rfl: 0   sertraline (ZOLOFT) 50 MG tablet, TAKE 1 TABLET BY MOUTH ONCE DAILY (ALONG  WITH  100MG  TABS)  -  NEED  APPT  BEFORE  30  DAYS  ARE  UP, Disp: 90 tablet, Rfl: 0  Observations/Objective: Patient is well-developed, well-nourished in no acute distress.  Resting comfortably at home.  Head is normocephalic, atraumatic.  No labored breathing. Speech is clear and coherent with logical content.  Patient is alert and oriented at baseline.   Assessment and Plan: 1. Acute bacterial sinusitis  2. Otalgia of right ear  Concern for sinusitis and possible  AOM/ETD combination. Stop Penicillin. Start Doxycyline BID. Supportive measures and OTC medications reviewed. Restart Flonase. Can use OTC Lidocaine ear drops from pharmacy if needed. In person evaluation if symptoms not improving/resolving or any new/worsening symptoms despite treatment.   Follow Up Instructions: I discussed the assessment and treatment plan with the patient. The patient was provided an opportunity to ask questions and all were answered. The patient agreed with the plan and demonstrated an understanding of the instructions.  A copy of instructions were sent to the patient via MyChart unless otherwise noted below.   The patient was advised to call back or seek an in-person evaluation if the symptoms worsen or if the condition fails to improve as anticipated.  Time:  I spent 10 minutes with the patient via telehealth technology discussing the above problems/concerns.    Betty Rio, PA-C

## 2022-05-14 NOTE — Patient Instructions (Signed)
Kirk Ruths, thank you for joining [name], PA-C for today's virtual visit.  While this provider is not your primary care provider (PCP), if your PCP is located in our provider database this encounter information will be shared with them immediately following your visit.   [place] account gives you access to today's visit and all your visits, tests, and labs performed at [facility] " click here if you don't have a [facility] account or go to [email or web address]  Consent: (Patient) Betty Walters provided verbal consent for this virtual visit at the beginning of the encounter.  Current Medications:  Current Outpatient Medications:    celecoxib (CELEBREX) 100 MG capsule, Take 1 capsule (100 mg total) by mouth 2 (two) times daily., Disp: 120 capsule, Rfl: 1   cyclobenzaprine (FLEXERIL) 5 MG tablet, Take 1 tablet (5 mg total) by mouth at bedtime as needed., Disp: 30 tablet, Rfl: 5   dicyclomine (BENTYL) 20 MG tablet, TAKE 1 TABLET BY MOUTH THREE TIMES DAILY BEFORE MEAL(S), Disp: 90 tablet, Rfl: 10   gabapentin (NEURONTIN) 300 MG capsule, TAKE 1 CAPSULE BY MOUTH THREE TIMES DAILY, Disp: 90 capsule, Rfl: 0   Insulin Pen Needle (PEN NEEDLES 3/16") 31G X 5 MM MISC, 1 each by Does not apply route daily., Disp: 100 each, Rfl: 0   liraglutide (VICTOZA) 18 MG/3ML SOPN, INJECT 1.8 MG INTO THE SKIN ONCE DAILY., Disp: 9 mL, Rfl: 1   lisinopril (ZESTRIL) 20 MG tablet, Take 1 tablet (20 mg total) by mouth daily., Disp: 90 tablet, Rfl: 1   lovastatin (MEVACOR) 40 MG tablet, Take 1 tablet dailyTAKE 1 TABLET BY MOUTH ONCE DAILY., Disp: 90 tablet, Rfl: 1   metFORMIN (GLUCOPHAGE) 1000 MG tablet, Take 1 tablet (1,000 mg total) by mouth 2 (two) times daily., Disp: 180 tablet, Rfl: 1   montelukast (SINGULAIR) 10 MG tablet, Take 1 tablet (10 mg total) by mouth at bedtime., Disp: 30 tablet, Rfl: 3   pantoprazole (PROTONIX) 40 MG tablet, Take 1 tablet (40 mg  total) by mouth daily., Disp: 90 tablet, Rfl: 2   penicillin v potassium (VEETID) 500 MG tablet, Take 1 tablet (500 mg total) by mouth in the morning and at bedtime for 10 days., Disp: 20 tablet, Rfl: 0   pioglitazone (ACTOS) 15 MG tablet, Take 1 tablet daily, Disp: 90 tablet, Rfl: 1   promethazine-dextromethorphan (PROMETHAZINE-DM) 6.25-15 MG/5ML syrup, Take 5 mLs by mouth 4 (four) times daily as needed., Disp: 118 mL, Rfl: 0   sertraline (ZOLOFT) 100 MG tablet, TAKE 1 TABLET BY MOUTH ONCE DAILY (ALONG  WITH  50MG  TAB), Disp: 90 tablet, Rfl: 0   sertraline (ZOLOFT) 50 MG tablet, TAKE 1 TABLET BY MOUTH ONCE DAILY (ALONG  WITH  100MG  TABS)  -  NEED  APPT  BEFORE  30  DAYS  ARE  UP, Disp: 90 tablet, Rfl: 0   Medications ordered in this encounter:  No orders of the defined types were placed in this encounter.    *If you need refills on other medications prior to your next appointment, please contact your pharmacy*  Follow-Up: Call back or seek an in-person evaluation if the symptoms worsen or if the condition fails to improve as anticipated.  [place] [phone]  Other Instructions Please take antibiotic as directed.  Increase fluid intake.  Use Saline nasal spray.  Take a daily multivitamin. Ok to continue Singulair. Restart Flonase. Tylenol and Ibuprofen OTC. Insurance is  showing will not cover the drops as prescription -- Would recommend getting OTC ear pain MD lidocaine drops to use as directed (or other brand pharmacist recommends).  Place a humidifier in the bedroom.  Please call or return clinic if symptoms are not improving.  Sinusitis Sinusitis is redness, soreness, and swelling (inflammation) of the paranasal sinuses. Paranasal sinuses are air pockets within the bones of your face (beneath the eyes, the middle of the forehead, or above the eyes). In healthy paranasal sinuses, mucus is able to drain out, and air is able to circulate through them by way of your nose.  However, when your paranasal sinuses are inflamed, mucus and air can become trapped. This can allow bacteria and other germs to grow and cause infection. Sinusitis can develop quickly and last only a short time (acute) or continue over a long period (chronic). Sinusitis that lasts for more than 12 weeks is considered chronic.  CAUSES  Causes of sinusitis include: Allergies. Structural abnormalities, such as displacement of the cartilage that separates your nostrils (deviated septum), which can decrease the air flow through your nose and sinuses and affect sinus drainage. Functional abnormalities, such as when the small hairs (cilia) that line your sinuses and help remove mucus do not work properly or are not present. SYMPTOMS  Symptoms of acute and chronic sinusitis are the same. The primary symptoms are pain and pressure around the affected sinuses. Other symptoms include: Upper toothache. Earache. Headache. Bad breath. Decreased sense of smell and taste. A cough, which worsens when you are lying flat. Fatigue. Fever. Thick drainage from your nose, which often is green and may contain pus (purulent). Swelling and warmth over the affected sinuses. DIAGNOSIS  Your caregiver will perform a physical exam. During the exam, your caregiver may: Look in your nose for signs of abnormal growths in your nostrils (nasal polyps). Tap over the affected sinus to check for signs of infection. View the inside of your sinuses (endoscopy) with a special imaging device with a light attached (endoscope), which is inserted into your sinuses. If your caregiver suspects that you have chronic sinusitis, one or more of the following tests may be recommended: Allergy tests. Nasal culture A sample of mucus is taken from your nose and sent to a lab and screened for bacteria. Nasal cytology A sample of mucus is taken from your nose and examined by your caregiver to determine if your sinusitis is related to an  allergy. TREATMENT  Most cases of acute sinusitis are related to a viral infection and will resolve on their own within 10 days. Sometimes medicines are prescribed to help relieve symptoms (pain medicine, decongestants, nasal steroid sprays, or saline sprays).  However, for sinusitis related to a bacterial infection, your caregiver will prescribe antibiotic medicines. These are medicines that will help kill the bacteria causing the infection.  Rarely, sinusitis is caused by a fungal infection. In theses cases, your caregiver will prescribe antifungal medicine. For some cases of chronic sinusitis, surgery is needed. Generally, these are cases in which sinusitis recurs more than 3 times per year, despite other treatments. HOME CARE INSTRUCTIONS  Drink plenty of water. Water helps thin the mucus so your sinuses can drain more easily. Use a humidifier. Inhale steam 3 to 4 times a day (for example, sit in the bathroom with the shower running). Apply a warm, moist washcloth to your face 3 to 4 times a day, or as directed by your caregiver. Use saline nasal sprays to help moisten and  clean your sinuses. Take over-the-counter or prescription medicines for pain, discomfort, or fever only as directed by your caregiver. SEEK IMMEDIATE MEDICAL CARE IF: You have increasing pain or severe headaches. You have nausea, vomiting, or drowsiness. You have swelling around your face. You have vision problems. You have a stiff neck. You have difficulty breathing. MAKE SURE YOU:  Understand these instructions. Will watch your condition. Will get help right away if you are not doing well or get worse. Document Released: 03/18/2005 Document Revised: 06/10/2011 Document Reviewed: 04/02/2011 [facility] Patient Information 2014 [place], [place].    If you have been instructed to have an in-person evaluation today at a local Urgent Care facility, please use the link below. It will take you to a list of all of our  available [facility], including address, phone number and hours of operation. Please do not delay care.  [facility]  If you or a family member do not have a primary care provider, use the link below to schedule a visit and establish care. When you choose a [facility] primary care physician or advanced practice provider, you gain a long-term partner in health. Find a Primary Care Provider  Learn more about [facility] in-office and virtual care options: [facility]

## 2022-05-17 ENCOUNTER — Other Ambulatory Visit: Payer: Self-pay | Admitting: Gastroenterology

## 2022-05-29 ENCOUNTER — Ambulatory Visit: Payer: Self-pay | Admitting: *Deleted

## 2022-05-29 NOTE — Telephone Encounter (Signed)
  Chief Complaint: Blistery, red rash under left breast Symptoms: Itching and discomfort, not really a pain or burning.  Has had Shingles before but in a different location.  Frequency: Since January that is slowing becoming worse. Pertinent Negatives: Patient denies Fever, chills, body aches or other symptoms.    Disposition: []$ ED /[]$ Urgent Care (no appt availability in office) / [x]$ Appointment(In office/virtual)/ []$  [facility]/ []$ Home Care/ []$ Refused Recommended Disposition /[]$ [facility]/ []$  Follow-up with PCP Additional Notes: Appt. Made with Dr. Ronnald Ramp for 05/30/2022 at 1:40.

## 2022-05-29 NOTE — Telephone Encounter (Signed)
Message from Luciana Axe sent at 05/29/2022  8:42 AM EST  Summary: Advise with Sx red spots with pain and blisters.   Pt is calling to report that she believes that she may have shingles- Pt has red spot under her left breast and a few places that look blistery. It goes from blistery to pain. With pain 6/10. With discomfort. Dr. Ronnald Ramp has no available appts until Friday- Please advise          Call History   Type Contact Phone/Fax User  05/29/2022 08:39 AM EST Phone (Incoming) [place], Betty Walters (Self) [phone] Lemmie Evens) Blase Mess A   Reason for Disposition  [1] Shingles rash AND [2] onset > 72 hours ago (3 days)  Answer Assessment - Initial Assessment Questions 1. APPEARANCE of RASH: "Describe the rash."      I've had Shingles before.    This is under my left breast.   It's red with bumps.    In a different place. 2. LOCATION: "Where is the rash located?"      Under left breast   It looks like little blistered up bumps.     I've been putting stuff on it to prevent chaffing     It's not getting better. 3. ONSET: "When did the rash start?"      Jan. I first noticed it.    It's getting worse. 4. ITCHING: "Does the rash itch?" If Yes, ask: "How bad is the itch?"  (Scale 1-10; or mild, moderate, severe)     Yes 5. PAIN: "Does the rash hurt?" If Yes, ask: "How bad is the pain?"  (Scale 0-10; or none, mild, moderate, severe)    - NONE (0): no pain    - MILD (1-3): doesn't interfere with normal activities     - MODERATE (4-7): interferes with normal activities or awakens from sleep     - SEVERE (8-10): excruciating pain, unable to do any normal activities     Yes uncomfortable not really burning.    6. OTHER SYMPTOMS: "Do you have any other symptoms?" (e.g., fever)     No 7. PREGNANCY: "Is there any chance you are pregnant?" "When was your last menstrual period?"     Not asked  Protocols used: Shingles ([place])-A-AH

## 2022-05-30 ENCOUNTER — Encounter: Payer: Self-pay | Admitting: Family Medicine

## 2022-05-30 ENCOUNTER — Ambulatory Visit: Payer: BC Managed Care – PPO | Admitting: Family Medicine

## 2022-05-30 VITALS — BP 120/78 | HR 96 | Ht 68.0 in | Wt 235.0 lb

## 2022-05-30 DIAGNOSIS — B379 Candidiasis, unspecified: Secondary | ICD-10-CM

## 2022-05-30 MED ORDER — NYSTATIN 100000 UNIT/GM EX CREA: 1.0000 | TOPICAL_CREAM | Freq: Two times a day (BID) | CUTANEOUS | 2 refills | Status: DC

## 2022-05-30 MED ORDER — NYSTATIN 100000 UNIT/ML MT SUSP: 5.0000 mL | Freq: Four times a day (QID) | OROMUCOSAL | 3 refills | Status: DC

## 2022-05-30 NOTE — Progress Notes (Signed)
Date:  05/30/2022   Name:  Betty Walters   DOB:  [birth year]   MRN:  [id number]   Chief Complaint: Rash (On L) side under breast)  Rash This is a new problem. The current episode started more than 1 month ago. The problem is unchanged. The affected locations include the chest (beneath breasts). The rash is characterized by burning and redness. She was exposed to nothing. Pertinent negatives include no anorexia, congestion, cough, eye pain, facial edema, fatigue, fever, joint pain, nail changes, rhinorrhea, shortness of breath, sore throat or vomiting. The treatment provided moderate relief.    Lab Results  Component Value Date   NA 141 02/25/2022   K 4.1 02/25/2022   CO2 21 02/25/2022   GLUCOSE 154 (H) 02/25/2022   BUN 19 02/25/2022   CREATININE 1.02 (H) 02/25/2022   CALCIUM 9.5 02/25/2022   EGFR 63 02/25/2022   GFRNONAA >60 12/12/2021   Lab Results  Component Value Date   CHOL 198 10/11/2021   HDL 58 10/11/2021   LDLCALC 110 (H) 10/11/2021   TRIG 171 (H) 10/11/2021   CHOLHDL 4.6 (H) 08/05/2018   No results found for: "TSH" Lab Results  Component Value Date   HGBA1C 7.0 (H) 02/25/2022   Lab Results  Component Value Date   WBC 5.9 12/11/2021   HGB 9.8 (L) 12/11/2021   HCT 30.8 (L) 12/11/2021   MCV 88.8 12/11/2021   PLT 207 12/11/2021   Lab Results  Component Value Date   ALT 14 12/10/2021   AST 18 12/10/2021   ALKPHOS 87 12/10/2021   BILITOT 0.3 12/10/2021   No results found for: "25OHVITD2", "25OHVITD3", "VD25OH"   Review of Systems  Constitutional:  Positive for chills. Negative for fatigue and fever.  HENT:  Negative for congestion, rhinorrhea and sore throat.   Eyes:  Negative for pain.  Respiratory:  Negative for cough and shortness of breath.   Cardiovascular:  Negative for chest pain.  Gastrointestinal:  Negative for anorexia and vomiting.  Musculoskeletal:  Negative for joint pain.  Skin:  Positive for rash. Negative for nail changes.     Patient Active Problem List   Diagnosis Date Noted   Acute pyelonephritis 12/10/2021   AKI (acute kidney injury) ([place]) 12/10/2021   Type 2 diabetes mellitus with peripheral neuropathy ([place]) 12/10/2021   Dyslipidemia 12/10/2021   Depression 12/10/2021   GERD without esophagitis 12/10/2021   Sprain, metatarsophalangeal joint 01/26/2018   Heartburn    Dyspepsia    Change in bowel habits    Benign neoplasm of descending colon    Chronic pain of toes of both feet 09/20/2016   Type 2 diabetes mellitus with complication ([place]) 0000000   Allergic rhinitis due to pollen 06/21/2015   Concussion with coma 10/21/2014   Clinical depression 09/28/2014   Diabetes mellitus, type 2 ([place]) 09/28/2014   HLD (hyperlipidemia) 09/28/2014   Essential hypertension 09/28/2014   Adiposity 09/28/2014    Allergies  Allergen Reactions   Amoxicillin-Pot Clavulanate Nausea And Vomiting    (OK with Amoxicillin alone)    Past Surgical History:  Procedure Laterality Date   CESAREAN SECTION     COLONOSCOPY WITH PROPOFOL N/A 04/11/2017   Procedure: COLONOSCOPY WITH colon;  Surgeon: Lucilla Lame, MD;  Location: [facility];  Service: Endoscopy;  Laterality: N/A;   ESOPHAGOGASTRODUODENOSCOPY (EGD) WITH PROPOFOL N/A 04/11/2017   Procedure: ESOPHAGOGASTRODUODENOSCOPY (EGD) WITH PROPOFOL;  Surgeon: Lucilla Lame, MD;  Location: [facility];  Service: Endoscopy;  Laterality: N/A;  Diabetic - oral meds   GALLBLADDER SURGERY     KNEE SURGERY     MYRINGOTOMY WITH TUBE PLACEMENT Right 03/22/2020   Procedure: MYRINGOTOMY WITH TUBE PLACEMENT;  Surgeon: Carloyn Manner, MD;  Location: [facility];  Service: ENT;  Laterality: Right;  Diabetic   POLYPECTOMY  04/11/2017   Procedure: POLYPECTOMY INTESTINAL;  Surgeon: Lucilla Lame, MD;  Location: [facility];  Service: Endoscopy;;   WRIST SURGERY      Social History   Tobacco Use   Smoking status: Never   Smokeless tobacco: Never   Vaping Use   Vaping Use: Never used  Substance Use Topics   Alcohol use: Yes    Alcohol/week: 0.0 standard drinks of alcohol    Comment: rare - Holidays   Drug use: No     Medication list has been reviewed and updated.  Current Meds  Medication Sig   celecoxib (CELEBREX) 100 MG capsule Take 1 capsule (100 mg total) by mouth 2 (two) times daily.   cyclobenzaprine (FLEXERIL) 5 MG tablet Take 1 tablet (5 mg total) by mouth at bedtime as needed.   dicyclomine (BENTYL) 20 MG tablet TAKE 1 TABLET BY MOUTH THREE TIMES DAILY BEFORE MEAL(S)   gabapentin (NEURONTIN) 300 MG capsule TAKE 1 CAPSULE BY MOUTH THREE TIMES DAILY   Insulin Pen Needle (PEN NEEDLES 3/16") 31G X 5 MM MISC 1 each by Does not apply route daily.   liraglutide (VICTOZA) 18 MG/3ML SOPN INJECT 1.8 MG INTO THE SKIN ONCE DAILY.   lisinopril (ZESTRIL) 20 MG tablet Take 1 tablet (20 mg total) by mouth daily.   lovastatin (MEVACOR) 40 MG tablet Take 1 tablet dailyTAKE 1 TABLET BY MOUTH ONCE DAILY.   metFORMIN (GLUCOPHAGE) 1000 MG tablet Take 1 tablet (1,000 mg total) by mouth 2 (two) times daily.   montelukast (SINGULAIR) 10 MG tablet Take 1 tablet (10 mg total) by mouth at bedtime.   pantoprazole (PROTONIX) 40 MG tablet Take 1 tablet (40 mg total) by mouth daily. MUST SCHEDULE APPOINTMENT   pioglitazone (ACTOS) 15 MG tablet Take 1 tablet daily   sertraline (ZOLOFT) 100 MG tablet TAKE 1 TABLET BY MOUTH ONCE DAILY (ALONG  WITH  '50MG'$   TAB)   sertraline (ZOLOFT) 50 MG tablet TAKE 1 TABLET BY MOUTH ONCE DAILY (ALONG  WITH  '100MG'$   TABS)  -  NEED  APPT  BEFORE  30  DAYS  ARE  UP       05/30/2022    1:52 PM 02/25/2022    4:09 PM 12/25/2021    4:22 PM 12/19/2021    3:43 PM  GAD 7 : Generalized Anxiety Score  Nervous, Anxious, on Edge 0 0 0 0  Control/stop worrying 0 0 0 1  Worry too much - different things 0 0 0 1  Trouble relaxing 0 0 0 0  Restless 0 0 0 0  Easily annoyed or irritable 0 0 0 1  Afraid - awful might happen 0 0 0 0   Total GAD 7 Score 0 0 0 3  Anxiety Difficulty Not difficult at all Not difficult at all Not difficult at all Not difficult at all       05/30/2022    1:51 PM 02/25/2022    4:09 PM 12/25/2021    4:22 PM  Depression screen PHQ 2/9  Decreased Interest 0 0 0  Down, Depressed, Hopeless 0 0 0  PHQ - 2 Score 0 0 0  Altered sleeping 0 0 0  Tired,  decreased energy 0 0 0  Change in appetite 0 0 0  Feeling bad or failure about yourself  0 0 0  Trouble concentrating 0 0 0  Moving slowly or fidgety/restless 0 0 0  Suicidal thoughts 0 0 0  PHQ-9 Score 0 0 0  Difficult doing work/chores Not difficult at all Not difficult at all Not difficult at all    BP Readings from Last 3 Encounters:  05/30/22 120/78  02/25/22 128/68  12/25/21 110/70    Physical Exam Vitals and nursing note reviewed. Exam conducted with a chaperone present.  Constitutional:      General: She is not in acute distress.    Appearance: She is not diaphoretic.  HENT:     Head: Normocephalic and atraumatic.     Right Ear: Tympanic membrane and external ear normal.     Left Ear: Tympanic membrane and external ear normal.     Nose: Nose normal.  Eyes:     General:        Right eye: No discharge.        Left eye: No discharge.     Conjunctiva/sclera: Conjunctivae normal.     Pupils: Pupils are equal, round, and reactive to light.  Neck:     Thyroid: No thyromegaly.     Vascular: No JVD.  Cardiovascular:     Rate and Rhythm: Normal rate and regular rhythm.     Heart sounds: Normal heart sounds. No murmur heard.    No friction rub. No gallop.  Pulmonary:     Effort: Pulmonary effort is normal.     Breath sounds: Normal breath sounds. No wheezing, rhonchi or rales.  Abdominal:     General: Bowel sounds are normal.     Palpations: Abdomen is soft. There is no mass.     Tenderness: There is no abdominal tenderness. There is no guarding.  Musculoskeletal:        General: Normal range of motion.     Cervical back:  Normal range of motion and neck supple.  Lymphadenopathy:     Cervical: No cervical adenopathy.  Skin:    General: Skin is warm and dry.     Findings: Erythema and rash present.  Neurological:     Mental Status: She is alert.     Wt Readings from Last 3 Encounters:  05/30/22 235 lb (106.6 kg)  02/25/22 238 lb (108 kg)  12/25/21 235 lb (106.6 kg)    BP 120/78   Pulse 96   Ht '5\' 8"'$  (1.727 m)   Wt 235 lb (106.6 kg)   SpO2 99%   BMI 35.73 kg/m   Assessment and Plan:    1. Candidiasis New onset.  Persistent.  Stable.  Patient has noted a rash for about 6 weeks beneath her breast which is pruritic and erythema in nature.  This is consistent with candidiasis and we will treat with nystatin cream apply twice a day.  I will have suggested that if this does not immediately start decreasing the rash that there may be a bacterial component as well some A&E ointment could be used with the nystatin cream. - nystatin cream (MYCOSTATIN); Apply 1 Application topically 2 (two) times daily.  Dispense: 30 g; Refill: 2     Otilio Miu, MD

## 2022-06-15 ENCOUNTER — Other Ambulatory Visit: Payer: Self-pay | Admitting: Family Medicine

## 2022-06-15 DIAGNOSIS — F3341 Major depressive disorder, recurrent, in partial remission: Secondary | ICD-10-CM

## 2022-06-15 DIAGNOSIS — I10 Essential (primary) hypertension: Secondary | ICD-10-CM

## 2022-06-17 NOTE — Telephone Encounter (Signed)
Requested Prescriptions  Pending Prescriptions Disp Refills   lisinopril (ZESTRIL) 20 MG tablet [Pharmacy Med Name: Lisinopril 20 MG Oral Tablet] 90 tablet 0    Sig: Take 1 tablet by mouth once daily     Cardiovascular:  ACE Inhibitors Failed - 06/15/2022 11:27 AM      Failed - Cr in normal range and within 180 days    Creatinine, Ser  Date Value Ref Range Status  02/25/2022 1.02 (H) 0.57 - 1.00 mg/dL Final         Passed - K in normal range and within 180 days    Potassium  Date Value Ref Range Status  02/25/2022 4.1 3.5 - 5.2 mmol/L Final         Passed - Patient is not pregnant      Passed - Last BP in normal range    BP Readings from Last 1 Encounters:  05/30/22 120/78         Passed - Valid encounter within last 6 months    Recent Outpatient Visits           2 weeks ago Candidiasis   [place] [facility] [place], [place], MD   3 months ago Type 2 diabetes mellitus without complication, without long-term current use of insulin ([place])   [place] [facility] [place], [place], MD   5 months ago Pyelonephritis   [place] at [place], Deanna C, MD   6 months ago Cystocele, midline   [place] [facility], Jesse Sans, MD   8 months ago Type 2 diabetes mellitus without complication, without long-term current use of insulin ([place])   [place] [facility] [place], Deanna C, MD       Future Appointments             In 1 week Juline Patch, MD [facility] at [place], [facility]             sertraline (ZOLOFT) 100 MG tablet [Pharmacy Med Name: Sertraline HCl 100 MG Oral Tablet] 90 tablet 0    Sig: TAKE 1 TABLET BY MOUTH ONCE DAILY (ALONG  WITH  50MG   TAB)     Psychiatry:  Antidepressants - SSRI - sertraline Passed - 06/15/2022  11:27 AM      Passed - AST in normal range and within 360 days    AST  Date Value Ref Range Status  12/10/2021 18 15 - 41 U/L Final         Passed - ALT in normal range and within 360 days    ALT  Date Value Ref Range Status  12/10/2021 14 0 - 44 U/L Final         Passed - Completed PHQ-2 or PHQ-9 in the last 360 days      Passed - Valid encounter within last 6 months    Recent Outpatient Visits           2 weeks ago Candidiasis   [place] [facility] [place] at [facility], MD   3 months ago Type 2 diabetes mellitus without complication, without long-term current use of insulin ([place])   [place] [facility] at [facility], MD   5 months  ago Pyelonephritis   [place] [facility] at [place], [place], MD   6 months ago Cystocele, midline   [facility] at [facility], Jesse Sans, MD   8 months ago Type 2 diabetes mellitus without complication, without long-term current use of insulin ([place])   [place] [facility] at [place], [place], MD       Future Appointments             In 1 week Juline Patch, MD [facility] at [facility], [place]             sertraline (ZOLOFT) 50 MG tablet [Pharmacy Med Name: Sertraline HCl 50 MG Oral Tablet] 90 tablet 0    Sig: TAKE 1 TABLET BY MOUTH ONCE DAILY (ALONG  WITH  100MG   TABS)  -  NEED  APPT  BEFORE  30  DAYS  ARE  UP     Psychiatry:  Antidepressants - SSRI - sertraline Passed - 06/15/2022 11:27 AM      Passed - AST in normal range and within 360 days    AST  Date Value Ref Range Status  12/10/2021 18 15 - 41 U/L Final         Passed - ALT in normal range and within 360 days    ALT  Date Value Ref Range Status  12/10/2021 14 0 - 44 U/L Final         Passed - Completed PHQ-2 or PHQ-9 in the last 360  days      Passed - Valid encounter within last 6 months    Recent Outpatient Visits           2 weeks ago Candidiasis   [place] [facility] [place], Deanna C, MD   3 months ago Type 2 diabetes mellitus without complication, without long-term current use of insulin ([place])    Primary [facility] Medicine at [place], Deanna C, MD   5 months ago Pyelonephritis   [place] at [place], Deanna C, MD   6 months ago Cystocele, midline   [facility] [place] at [facility], Jesse Sans, MD   8 months ago Type 2 diabetes mellitus without complication, without long-term current use of insulin [facility])    Primary Care & [facility] Medicine at [place], [place], MD       Future Appointments             In 1 week Juline Patch, MD [place] at [facility], [facility]

## 2022-06-23 ENCOUNTER — Other Ambulatory Visit: Payer: Self-pay | Admitting: Family Medicine

## 2022-06-23 DIAGNOSIS — E782 Mixed hyperlipidemia: Secondary | ICD-10-CM

## 2022-06-24 NOTE — Telephone Encounter (Signed)
Pt is calling in for an update on this medication lisinopril (ZESTRIL) 20 MG tablet [id number] . Pt says the pharmacy says they sent over a refill request but as of yesterday 06/24/22 they don't have anything. Please advise.

## 2022-06-24 NOTE — Telephone Encounter (Signed)
.   Requested Prescriptions  Pending Prescriptions Disp Refills   lovastatin (MEVACOR) 40 MG tablet [Pharmacy Med Name: Lovastatin 40 MG Oral Tablet] 90 tablet 0    Sig: Take 1 tablet by mouth once daily     Cardiovascular:  Antilipid - Statins 2 Failed - 06/23/2022 12:58 PM      Failed - Cr in normal range and within 360 days    Creatinine, Ser  Date Value Ref Range Status  02/25/2022 1.02 (H) 0.57 - 1.00 mg/dL Final         Failed - Lipid Panel in normal range within the last 12 months    Cholesterol, Total  Date Value Ref Range Status  10/11/2021 198 100 - 199 mg/dL Final   LDL Chol Calc (NIH)  Date Value Ref Range Status  10/11/2021 110 (H) 0 - 99 mg/dL Final   HDL  Date Value Ref Range Status  10/11/2021 58 >39 mg/dL Final   Triglycerides  Date Value Ref Range Status  10/11/2021 171 (H) 0 - 149 mg/dL Final         Passed - Patient is not pregnant      Passed - Valid encounter within last 12 months    Recent Outpatient Visits           3 weeks ago Candidiasis   [place] [facility] at [place], [place], MD   3 months ago Type 2 diabetes mellitus without complication, without long-term current use of insulin ([place])   [place] [facility] at [place], Deanna C, MD   6 months ago Pyelonephritis   [facility] at [place], Deanna C, MD   6 months ago Cystocele, midline   [facility] at [facility], Jesse Sans, MD   8 months ago Type 2 diabetes mellitus without complication, without long-term current use of insulin [facility])   [place] [facility] at [place], [place], MD       Future Appointments             In 1 month Juline Patch, MD [place] at [facility], [facility]

## 2022-06-26 ENCOUNTER — Telehealth: Payer: BC Managed Care – PPO | Admitting: Nurse Practitioner

## 2022-06-26 ENCOUNTER — Ambulatory Visit: Payer: BC Managed Care – PPO | Admitting: Family Medicine

## 2022-06-26 DIAGNOSIS — J0140 Acute pansinusitis, unspecified: Secondary | ICD-10-CM

## 2022-06-26 DIAGNOSIS — R051 Acute cough: Secondary | ICD-10-CM

## 2022-06-26 MED ORDER — DOXYCYCLINE HYCLATE 100 MG PO TABS: 100.0000 mg | ORAL_TABLET | Freq: Two times a day (BID) | ORAL | 0 refills | Status: AC

## 2022-06-26 MED ORDER — BENZONATATE 100 MG PO CAPS: 100.0000 mg | ORAL_CAPSULE | Freq: Three times a day (TID) | ORAL | 0 refills | Status: DC | PRN

## 2022-06-26 NOTE — Progress Notes (Signed)
Virtual Visit Consent   Betty Walters, you are scheduled for a virtual visit with a [place] provider today. Just as with appointments in the office, your consent must be obtained to participate. Your consent will be active for this visit and any virtual visit you may have with one of our providers in the next 365 days. If you have a MyChart account, a copy of this consent can be sent to you electronically.  As this is a virtual visit, video technology does not allow for your provider to perform a traditional examination. This may limit your provider's ability to fully assess your condition. If your provider identifies any concerns that need to be evaluated in person or the need to arrange testing (such as labs, EKG, etc.), we will make arrangements to do so. Although advances in technology are sophisticated, we cannot ensure that it will always work on either your end or our end. If the connection with a video visit is poor, the visit may have to be switched to a telephone visit. With either a video or telephone visit, we are not always able to ensure that we have a secure connection.  By engaging in this virtual visit, you consent to the provision of healthcare and authorize for your insurance to be billed (if applicable) for the services provided during this visit. Depending on your insurance coverage, you may receive a charge related to this service.  I need to obtain your verbal consent now. Are you willing to proceed with your visit today? Betty Walters has provided verbal consent on 06/26/2022 for a virtual visit (video or telephone). Apolonio Schneiders, FNP  Date: 06/26/2022 4:25 PM  Virtual Visit via Video Note   I, Apolonio Schneiders, connected with  Betty Walters  ([id number], 04/08/1960) on 06/26/22 at  4:45 PM EDT by a video-enabled telemedicine application and verified that I am speaking with the correct person using two identifiers.  Location: Patient: Virtual Visit Location Patient:  Home Provider: Virtual Visit Location Provider: Home Office   I discussed the limitations of evaluation and management by telemedicine and the availability of in person appointments. The patient expressed understanding and agreed to proceed.    History of Present Illness: Betty Walters is a 62 y.o. who identifies as a female who was assigned female at birth, and is being seen today for loss of voice, sinus congestion and post nasal drainage.  She has a productive cough though she feels that is mostly from post nasal drainage   Feels her drainage has darkened   Denies fevers   Symptom onset was last week started with a "hacky cough"  She has been using her Flonase and Vicks vapor for cold/flu   Her cough worsens at night   Denies a history of asthma or COPD  Has not needed inhalers in the past   Problems:  Patient Active Problem List   Diagnosis Date Noted   Acute pyelonephritis 12/10/2021   AKI (acute kidney injury) ([place]) 12/10/2021   Type 2 diabetes mellitus with peripheral neuropathy ([place]) 12/10/2021   Dyslipidemia 12/10/2021   Depression 12/10/2021   GERD without esophagitis 12/10/2021   Sprain, metatarsophalangeal joint 01/26/2018   Heartburn    Dyspepsia    Change in bowel habits    Benign neoplasm of descending colon    Chronic pain of toes of both feet 09/20/2016   Type 2 diabetes mellitus with complication ([place]) 0000000   Allergic rhinitis due to pollen 06/21/2015  Concussion with coma 10/21/2014   Clinical depression 09/28/2014   Diabetes mellitus, type 2 ([place]) 09/28/2014   HLD (hyperlipidemia) 09/28/2014   Essential hypertension 09/28/2014   Adiposity 09/28/2014    Allergies:  Allergies  Allergen Reactions   Amoxicillin-Pot Clavulanate Nausea And Vomiting    (OK with Amoxicillin alone)   Medications:  Current Outpatient Medications:    celecoxib (CELEBREX) 100 MG capsule, Take 1 capsule (100 mg total) by mouth 2 (two) times daily., Disp: 120  capsule, Rfl: 1   cyclobenzaprine (FLEXERIL) 5 MG tablet, Take 1 tablet (5 mg total) by mouth at bedtime as needed., Disp: 30 tablet, Rfl: 5   dicyclomine (BENTYL) 20 MG tablet, TAKE 1 TABLET BY MOUTH THREE TIMES DAILY BEFORE MEAL(S), Disp: 90 tablet, Rfl: 10   gabapentin (NEURONTIN) 300 MG capsule, TAKE 1 CAPSULE BY MOUTH THREE TIMES DAILY, Disp: 90 capsule, Rfl: 0   Insulin Pen Needle (PEN NEEDLES 3/16") 31G X 5 MM MISC, 1 each by Does not apply route daily., Disp: 100 each, Rfl: 0   liraglutide (VICTOZA) 18 MG/3ML SOPN, INJECT 1.8 MG INTO THE SKIN ONCE DAILY., Disp: 9 mL, Rfl: 1   lisinopril (ZESTRIL) 20 MG tablet, Take 1 tablet by mouth once daily, Disp: 90 tablet, Rfl: 0   lovastatin (MEVACOR) 40 MG tablet, Take 1 tablet by mouth once daily, Disp: 90 tablet, Rfl: 0   metFORMIN (GLUCOPHAGE) 1000 MG tablet, Take 1 tablet (1,000 mg total) by mouth 2 (two) times daily., Disp: 180 tablet, Rfl: 1   montelukast (SINGULAIR) 10 MG tablet, Take 1 tablet (10 mg total) by mouth at bedtime., Disp: 30 tablet, Rfl: 3   nystatin cream (MYCOSTATIN), Apply 1 Application topically 2 (two) times daily., Disp: 30 g, Rfl: 2   pantoprazole (PROTONIX) 40 MG tablet, Take 1 tablet (40 mg total) by mouth daily. MUST SCHEDULE APPOINTMENT, Disp: 90 tablet, Rfl: 0   pioglitazone (ACTOS) 15 MG tablet, Take 1 tablet daily, Disp: 90 tablet, Rfl: 1   sertraline (ZOLOFT) 100 MG tablet, TAKE 1 TABLET BY MOUTH ONCE DAILY (ALONG  WITH  50MG   TAB), Disp: 90 tablet, Rfl: 0   sertraline (ZOLOFT) 50 MG tablet, TAKE 1 TABLET BY MOUTH ONCE DAILY (ALONG  WITH  100MG   TABS)  -  NEED  APPT  BEFORE  30  DAYS  ARE  UP, Disp: 90 tablet, Rfl: 0  Observations/Objective: Patient is well-developed, well-nourished in no acute distress.  Resting comfortably  at home.  Head is normocephalic, atraumatic.  No labored breathing.  Speech is clear and coherent with logical content.  Patient is alert and oriented at baseline.    Assessment and  Plan: 1. Acute non-recurrent pansinusitis  - doxycycline (VIBRA-TABS) 100 MG tablet; Take 1 tablet (100 mg total) by mouth 2 (two) times daily for 10 days.  Dispense: 20 tablet; Refill: 0  2. Acute cough  - benzonatate (TESSALON) 100 MG capsule; Take 1 capsule (100 mg total) by mouth 3 (three) times daily as needed.  Dispense: 30 capsule; Refill: 0     Follow Up Instructions: I discussed the assessment and treatment plan with the patient. The patient was provided an opportunity to ask questions and all were answered. The patient agreed with the plan and demonstrated an understanding of the instructions.  A copy of instructions were sent to the patient via MyChart unless otherwise noted below.    The patient was advised to call back or seek an in-person evaluation if the symptoms worsen or  if the condition fails to improve as anticipated.  Time:  I spent 14 minutes with the patient via telehealth technology discussing the above problems/concerns.    Apolonio Schneiders, FNP

## 2022-07-02 ENCOUNTER — Other Ambulatory Visit: Payer: Self-pay | Admitting: Gastroenterology

## 2022-07-10 ENCOUNTER — Other Ambulatory Visit: Payer: Self-pay | Admitting: Family Medicine

## 2022-07-10 DIAGNOSIS — E119 Type 2 diabetes mellitus without complications: Secondary | ICD-10-CM

## 2022-07-11 NOTE — Telephone Encounter (Signed)
Requested Prescriptions  Pending Prescriptions Disp Refills   liraglutide (VICTOZA) 18 MG/3ML SOPN [Pharmacy Med Name: Victoza 18 MG/3ML Subcutaneous Solution Pen-injector] 9 mL 0    Sig: INJECT 1.8MG  INTO THE SKIN ONCE A DAY     Endocrinology:  Diabetes - GLP-1 Receptor Agonists Passed - 07/10/2022  9:47 PM      Passed - HBA1C is between 0 and 7.9 and within 180 days    Hgb A1c MFr Bld  Date Value Ref Range Status  02/25/2022 7.0 (H) 4.8 - 5.6 % Final    Comment:             Prediabetes: 5.7 - 6.4          Diabetes: >6.4          Glycemic control for adults with diabetes: <7.0          Passed - Valid encounter within last 6 months    Recent Outpatient Visits           1 month ago Candidiasis   [facility] [facility]   4 months ago Type 2 diabetes mellitus without complication, without long-term current use of insulin (HCC)   [place] [facility]   6 months ago Pyelonephritis   [facility]   6 months ago Cystocele, midline   [facility] at [facility], [facility] Cowden, MD   9 months ago Type 2 diabetes mellitus without complication, without long-term current use of insulin [facility])   [place] [facility]       Future Appointments             In 2 weeks Duanne Limerick, MD [facility] at [facility]

## 2022-07-24 ENCOUNTER — Other Ambulatory Visit: Payer: Self-pay | Admitting: Podiatry

## 2022-07-29 ENCOUNTER — Ambulatory Visit: Payer: BC Managed Care – PPO | Admitting: Family Medicine

## 2022-07-29 ENCOUNTER — Encounter: Payer: Self-pay | Admitting: Family Medicine

## 2022-07-29 VITALS — BP 120/76 | HR 80 | Ht 68.0 in | Wt 241.0 lb

## 2022-07-29 DIAGNOSIS — F3341 Major depressive disorder, recurrent, in partial remission: Secondary | ICD-10-CM

## 2022-07-29 DIAGNOSIS — E782 Mixed hyperlipidemia: Secondary | ICD-10-CM | POA: Diagnosis not present

## 2022-07-29 DIAGNOSIS — E119 Type 2 diabetes mellitus without complications: Secondary | ICD-10-CM

## 2022-07-29 DIAGNOSIS — J301 Allergic rhinitis due to pollen: Secondary | ICD-10-CM

## 2022-07-29 DIAGNOSIS — I10 Essential (primary) hypertension: Secondary | ICD-10-CM | POA: Diagnosis not present

## 2022-07-29 MED ORDER — LOVASTATIN 40 MG PO TABS: ORAL_TABLET | ORAL | 1 refills | Status: DC

## 2022-07-29 MED ORDER — PANTOPRAZOLE SODIUM 40 MG PO TBEC: 40.0000 mg | DELAYED_RELEASE_TABLET | Freq: Every day | ORAL | 1 refills | Status: DC

## 2022-07-29 MED ORDER — SERTRALINE HCL 50 MG PO TABS: ORAL_TABLET | ORAL | 1 refills | Status: DC

## 2022-07-29 MED ORDER — VICTOZA 18 MG/3ML ~~LOC~~ SOPN: PEN_INJECTOR | SUBCUTANEOUS | 1 refills | Status: DC

## 2022-07-29 MED ORDER — SERTRALINE HCL 100 MG PO TABS: ORAL_TABLET | ORAL | 1 refills | Status: DC

## 2022-07-29 MED ORDER — METFORMIN HCL 1000 MG PO TABS: 1000.0000 mg | ORAL_TABLET | Freq: Two times a day (BID) | ORAL | 1 refills | Status: DC

## 2022-07-29 MED ORDER — LISINOPRIL 20 MG PO TABS: 20.0000 mg | ORAL_TABLET | Freq: Every day | ORAL | 1 refills | Status: DC

## 2022-07-29 MED ORDER — PIOGLITAZONE HCL 15 MG PO TABS: ORAL_TABLET | ORAL | 1 refills | Status: DC

## 2022-07-29 NOTE — Progress Notes (Signed)
Date:  07/29/2022   Name:  Betty Walters   DOB:  08/30/1960   MRN:  [id number]   Chief Complaint: Diabetes, Hyperlipidemia, Gastroesophageal Reflux, and Depression  Diabetes She presents for her follow-up diabetic visit. She has type 2 diabetes mellitus. There are no hypoglycemic associated symptoms. Pertinent negatives for hypoglycemia include no headaches. Pertinent negatives for diabetes include no blurred vision, no chest pain, no fatigue, no polydipsia and no polyuria. There are no hypoglycemic complications. Symptoms are stable. There are no diabetic complications. Risk factors for coronary artery disease include hypertension, diabetes mellitus and dyslipidemia. Current diabetic treatment includes oral agent (dual therapy). She is following a generally healthy diet. She participates in exercise daily. An ACE inhibitor/angiotensin II receptor blocker is being taken.  Hyperlipidemia This is a chronic problem. The current episode started more than 1 year ago. The problem is controlled. Recent lipid tests were reviewed and are normal. There are no known factors aggravating her hyperlipidemia. Pertinent negatives include no chest pain, leg pain, myalgias or shortness of breath. Current antihyperlipidemic treatment includes statins. The current treatment provides moderate improvement of lipids. There are no compliance problems.   Gastroesophageal Reflux She reports no abdominal pain, no chest pain, no dysphagia or no heartburn. This is a chronic problem. The problem has been gradually improving. The symptoms are aggravated by certain foods. Pertinent negatives include no fatigue or melena. She has tried a PPI for the symptoms. The treatment provided moderate relief.  Depression        This is a chronic problem.  The current episode started more than 1 year ago.   The problem has been gradually improving since onset.  Associated symptoms include no decreased concentration, no fatigue, no  helplessness, no hopelessness, does not have insomnia, not irritable, no restlessness, no decreased interest, no appetite change, no body aches, no myalgias, no headaches, no indigestion, not sad and no suicidal ideas.  Past treatments include SSRIs - Selective serotonin reuptake inhibitors.  Compliance with treatment is good.   Lab Results  Component Value Date   NA 141 02/25/2022   K 4.1 02/25/2022   CO2 21 02/25/2022   GLUCOSE 154 (H) 02/25/2022   BUN 19 02/25/2022   CREATININE 1.02 (H) 02/25/2022   CALCIUM 9.5 02/25/2022   EGFR 63 02/25/2022   GFRNONAA >60 12/12/2021   Lab Results  Component Value Date   CHOL 198 10/11/2021   HDL 58 10/11/2021   LDLCALC 110 (H) 10/11/2021   TRIG 171 (H) 10/11/2021   CHOLHDL 4.6 (H) 08/05/2018   No results found for: "TSH" Lab Results  Component Value Date   HGBA1C 7.0 (H) 02/25/2022   Lab Results  Component Value Date   WBC 5.9 12/11/2021   HGB 9.8 (L) 12/11/2021   HCT 30.8 (L) 12/11/2021   MCV 88.8 12/11/2021   PLT 207 12/11/2021   Lab Results  Component Value Date   ALT 14 12/10/2021   AST 18 12/10/2021   ALKPHOS 87 12/10/2021   BILITOT 0.3 12/10/2021   No results found for: "25OHVITD2", "25OHVITD3", "VD25OH"   Review of Systems  Constitutional:  Negative for appetite change and fatigue.  Eyes:  Negative for blurred vision and visual disturbance.  Respiratory:  Negative for shortness of breath.   Cardiovascular:  Positive for leg swelling. Negative for chest pain and palpitations.  Gastrointestinal:  Negative for abdominal distention, abdominal pain, blood in stool, dysphagia, heartburn and melena.  Endocrine: Negative for polydipsia and polyuria.  Musculoskeletal:  Negative for myalgias.  Neurological:  Negative for headaches.  Psychiatric/Behavioral:  Positive for depression. Negative for decreased concentration and suicidal ideas. The patient does not have insomnia.     Patient Active Problem List   Diagnosis Date  Noted   Acute pyelonephritis 12/10/2021   AKI (acute kidney injury) (HCC) 12/10/2021   Type 2 diabetes mellitus with peripheral neuropathy (HCC) 12/10/2021   Dyslipidemia 12/10/2021   Depression 12/10/2021   GERD without esophagitis 12/10/2021   Sprain, metatarsophalangeal joint 01/26/2018   Heartburn    Dyspepsia    Change in bowel habits    Benign neoplasm of descending colon    Chronic pain of toes of both feet 09/20/2016   Type 2 diabetes mellitus with complication (HCC) 09/06/2015   Allergic rhinitis due to pollen 06/21/2015   Concussion with coma 10/21/2014   Clinical depression 09/28/2014   Diabetes mellitus, type 2 (HCC) 09/28/2014   HLD (hyperlipidemia) 09/28/2014   Essential hypertension 09/28/2014   Adiposity 09/28/2014    Allergies  Allergen Reactions   Amoxicillin-Pot Clavulanate Nausea And Vomiting    (OK with Amoxicillin alone)    Past Surgical History:  Procedure Laterality Date   CESAREAN SECTION     COLONOSCOPY WITH PROPOFOL N/A 04/11/2017   Procedure: COLONOSCOPY WITH colon;  Surgeon: Midge Minium, MD;  Location: [facility];  Service: Endoscopy;  Laterality: N/A;   ESOPHAGOGASTRODUODENOSCOPY (EGD) WITH PROPOFOL N/A 04/11/2017   Procedure: ESOPHAGOGASTRODUODENOSCOPY (EGD) WITH PROPOFOL;  Surgeon: Midge Minium, MD;  Location: [facility];  Service: Endoscopy;  Laterality: N/A;  Diabetic - oral meds   GALLBLADDER SURGERY     KNEE SURGERY     MYRINGOTOMY WITH TUBE PLACEMENT Right 03/22/2020   Procedure: MYRINGOTOMY WITH TUBE PLACEMENT;  Surgeon: Bud Face, MD;  Location: [facility];  Service: ENT;  Laterality: Right;  Diabetic   POLYPECTOMY  04/11/2017   Procedure: POLYPECTOMY INTESTINAL;  Surgeon: Midge Minium, MD;  Location: [facility];  Service: Endoscopy;;   WRIST SURGERY      Social History   Tobacco Use   Smoking status: Never   Smokeless tobacco: Never  Vaping Use   Vaping Use: Never used  Substance Use  Topics   Alcohol use: Yes    Alcohol/week: 0.0 standard drinks of alcohol    Comment: rare - Holidays   Drug use: No     Medication list has been reviewed and updated.  Current Meds  Medication Sig   celecoxib (CELEBREX) 100 MG capsule Take 1 capsule (100 mg total) by mouth 2 (two) times daily.   cyclobenzaprine (FLEXERIL) 5 MG tablet Take 1 tablet (5 mg total) by mouth at bedtime as needed.   dicyclomine (BENTYL) 20 MG tablet TAKE 1 TABLET BY MOUTH THREE TIMES DAILY BEFORE MEAL(S)   gabapentin (NEURONTIN) 300 MG capsule Take 1 capsule by mouth at bedtime   Insulin Pen Needle (PEN NEEDLES 3/16") 31G X 5 MM MISC 1 each by Does not apply route daily.   liraglutide (VICTOZA) 18 MG/3ML SOPN INJECT 1.8MG  INTO THE SKIN ONCE A DAY   lisinopril (ZESTRIL) 20 MG tablet Take 1 tablet by mouth once daily   lovastatin (MEVACOR) 40 MG tablet Take 1 tablet by mouth once daily   metFORMIN (GLUCOPHAGE) 1000 MG tablet Take 1 tablet (1,000 mg total) by mouth 2 (two) times daily.   nystatin cream (MYCOSTATIN) Apply 1 Application topically 2 (two) times daily.   pantoprazole (PROTONIX) 40 MG tablet Take 1 tablet (40 mg total)  by mouth daily. MUST SCHEDULE APPOINTMENT   pioglitazone (ACTOS) 15 MG tablet Take 1 tablet daily   sertraline (ZOLOFT) 100 MG tablet TAKE 1 TABLET BY MOUTH ONCE DAILY (ALONG  WITH  50MG   TAB)   sertraline (ZOLOFT) 50 MG tablet TAKE 1 TABLET BY MOUTH ONCE DAILY (ALONG  WITH  100MG   TABS)  -  NEED  APPT  BEFORE  30  DAYS  ARE  UP       07/29/2022    3:51 PM 05/30/2022    1:52 PM 02/25/2022    4:09 PM 12/25/2021    4:22 PM  GAD 7 : Generalized Anxiety Score  Nervous, Anxious, on Edge 0 0 0 0  Control/stop worrying 0 0 0 0  Worry too much - different things 0 0 0 0  Trouble relaxing 0 0 0 0  Restless 0 0 0 0  Easily annoyed or irritable 0 0 0 0  Afraid - awful might happen 0 0 0 0  Total GAD 7 Score 0 0 0 0  Anxiety Difficulty Not difficult at all Not difficult at all Not  difficult at all Not difficult at all       07/29/2022    3:50 PM 05/30/2022    1:51 PM 02/25/2022    4:09 PM  Depression screen PHQ 2/9  Decreased Interest 0 0 0  Down, Depressed, Hopeless 0 0 0  PHQ - 2 Score 0 0 0  Altered sleeping 0 0 0  Tired, decreased energy 0 0 0  Change in appetite 0 0 0  Feeling bad or failure about yourself  0 0 0  Trouble concentrating 0 0 0  Moving slowly or fidgety/restless 0 0 0  Suicidal thoughts 0 0 0  PHQ-9 Score 0 0 0  Difficult doing work/chores Not difficult at all Not difficult at all Not difficult at all    BP Readings from Last 3 Encounters:  07/29/22 120/76  05/30/22 120/78  02/25/22 128/68    Physical Exam Vitals and nursing note reviewed. Exam conducted with a chaperone present.  Constitutional:      General: She is not irritable.She is not in acute distress.    Appearance: She is not diaphoretic.  HENT:     Head: Normocephalic and atraumatic.     Right Ear: Tympanic membrane and external ear normal.     Left Ear: Tympanic membrane and external ear normal.     Nose: Nose normal.     Mouth/Throat:     Mouth: Mucous membranes are moist.  Eyes:     General:        Right eye: No discharge.        Left eye: No discharge.     Conjunctiva/sclera: Conjunctivae normal.     Pupils: Pupils are equal, round, and reactive to light.  Neck:     Thyroid: No thyromegaly.     Vascular: No JVD.  Cardiovascular:     Rate and Rhythm: Normal rate and regular rhythm.     Heart sounds: Normal heart sounds. No murmur heard.    No friction rub. No gallop.  Pulmonary:     Effort: Pulmonary effort is normal.     Breath sounds: Normal breath sounds. No wheezing or rhonchi.  Abdominal:     General: Bowel sounds are normal.     Palpations: Abdomen is soft. There is no mass.     Tenderness: There is no abdominal tenderness. There is no guarding.  Musculoskeletal:  General: Normal range of motion.     Cervical back: Normal range of motion  and neck supple.  Lymphadenopathy:     Cervical: No cervical adenopathy.  Skin:    General: Skin is warm and dry.  Neurological:     Mental Status: She is alert.     Deep Tendon Reflexes: Reflexes are normal and symmetric.     Wt Readings from Last 3 Encounters:  07/29/22 241 lb (109.3 kg)  05/30/22 235 lb (106.6 kg)  02/25/22 238 lb (108 kg)    BP 120/76   Pulse 80   Ht 5\' 8"  (1.727 m)   Wt 241 lb (109.3 kg)   SpO2 95%   BMI 36.64 kg/m   Assessment and Plan: 1. Type 2 diabetes mellitus without complication, without long-term current use of insulin (HCC) Chronic.  Controlled.  Stable.  Continue Victoza 1.8 mg daily along with metformin 1 g twice a day and pioglitazone 15 mg once a day.  Will check A1c for current level of control and CMP for electrolytes and GFR. - liraglutide (VICTOZA) 18 MG/3ML SOPN; INJECT 1.8MG  INTO THE SKIN ONCE A DAY  Dispense: 9 mL; Refill: 1 - metFORMIN (GLUCOPHAGE) 1000 MG tablet; Take 1 tablet (1,000 mg total) by mouth 2 (two) times daily.  Dispense: 180 tablet; Refill: 1 - pioglitazone (ACTOS) 15 MG tablet; Take 1 tablet daily  Dispense: 90 tablet; Refill: 1 - HgB A1c - Comprehensive Metabolic Panel (CMET)  2. Essential hypertension Chronic.  Controlled.  Stable.  Blood pressure 120/76.  Asymptomatic.  Tolerating medications well.  Continue lisinopril 20 mg once a day.  Will obtain lab work to look at GFR and electrolytes. - lisinopril (ZESTRIL) 20 MG tablet; Take 1 tablet (20 mg total) by mouth daily.  Dispense: 90 tablet; Refill: 1 - Comprehensive Metabolic Panel (CMET)  3. Mixed hyperlipidemia .  Controlled.  Stable.  Continue lovastatin 40 mg once a day. - lovastatin (MEVACOR) 40 MG tablet; Take 1 tablet by mouth once daily  Dispense: 90 tablet; Refill: 1  4. Seasonal allergic rhinitis due to pollen Seasonal.  Episodic.  Relatively controlled at this time and is using over-the-counter preparations.  5. Recurrent major depressive disorder,  in partial remission (HCC) Chronic.  Controlled.  Stable.  PHQ is 0 GAD score 0 continue sertraline 100 mg once a day followed by 50 mg once a day. - sertraline (ZOLOFT) 100 MG tablet; TAKE 1 TABLET BY MOUTH ONCE DAILY (ALONG  WITH  50MG   TAB)  Dispense: 90 tablet; Refill: 1 - sertraline (ZOLOFT) 50 MG tablet; TAKE 1 TABLET BY MOUTH ONCE DAILY (ALONG  WITH  100MG   TABS)  -  Dispense: 90 tablet; Refill: 1     Elizabeth Sauer, MD

## 2022-07-30 ENCOUNTER — Other Ambulatory Visit: Payer: Self-pay

## 2022-07-30 DIAGNOSIS — E119 Type 2 diabetes mellitus without complications: Secondary | ICD-10-CM

## 2022-07-30 LAB — COMPREHENSIVE METABOLIC PANEL
ALT: 14 IU/L (ref 0–32)
AST: 14 IU/L (ref 0–40)
Albumin/Globulin Ratio: 1.6 (ref 1.2–2.2)
Albumin: 4.1 g/dL (ref 3.8–4.9)
Alkaline Phosphatase: 93 IU/L (ref 44–121)
BUN/Creatinine Ratio: 22 (ref 9–23)
BUN: 21 mg/dL (ref 6–24)
Bilirubin Total: <0.2 mg/dL (ref 0.0–1.2)
CO2: 21 mmol/L (ref 20–29)
Calcium: 9.5 mg/dL (ref 8.7–10.2)
Chloride: 105 mmol/L (ref 96–106)
Creatinine, Ser: 0.94 mg/dL (ref 0.57–1.00)
Globulin, Total: 2.6 g/dL (ref 1.5–4.5)
Glucose: 160 mg/dL — ABNORMAL HIGH (ref 70–99)
Potassium: 4.4 mmol/L (ref 3.5–5.2)
Sodium: 140 mmol/L (ref 134–144)
Total Protein: 6.7 g/dL (ref 6.0–8.5)
eGFR: 70 mL/min/{1.73_m2} (ref >59)

## 2022-07-30 LAB — HEMOGLOBIN A1C
Est. average glucose Bld gHb Est-mCnc: 174 mg/dL
Hgb A1c MFr Bld: 7.7 % — ABNORMAL HIGH (ref 4.8–5.6)

## 2022-07-30 NOTE — Progress Notes (Signed)
Ref to endo placed 

## 2022-08-21 ENCOUNTER — Telehealth: Payer: Self-pay | Admitting: Gastroenterology

## 2022-08-21 NOTE — Telephone Encounter (Signed)
Pt left message needs refill on pantoprazole 40mg  and dicyclomine 20 mg [facility] on [address]

## 2022-08-22 MED ORDER — DICYCLOMINE HCL 20 MG PO TABS: 20.0000 mg | ORAL_TABLET | Freq: Three times a day (TID) | ORAL | 0 refills | Status: DC

## 2022-08-22 NOTE — Telephone Encounter (Signed)
Pantoprazole was filled by PCP 07/29/22 #90 with 1 refill  Rx sent through e-scribe

## 2022-08-22 NOTE — Addendum Note (Signed)
Addended by: Roena Malady on: 08/22/2022 12:03 PM   Modules accepted: Orders

## 2022-09-17 ENCOUNTER — Telehealth: Payer: BC Managed Care – PPO | Admitting: Gastroenterology

## 2022-09-17 ENCOUNTER — Telehealth (INDEPENDENT_AMBULATORY_CARE_PROVIDER_SITE_OTHER): Payer: BC Managed Care – PPO | Admitting: Gastroenterology

## 2022-09-17 DIAGNOSIS — K580 Irritable bowel syndrome with diarrhea: Secondary | ICD-10-CM | POA: Diagnosis not present

## 2022-09-17 MED ORDER — PANTOPRAZOLE SODIUM 40 MG PO TBEC: 40.0000 mg | DELAYED_RELEASE_TABLET | Freq: Every day | ORAL | 3 refills | Status: DC

## 2022-09-17 MED ORDER — DICYCLOMINE HCL 20 MG PO TABS: 20.0000 mg | ORAL_TABLET | Freq: Three times a day (TID) | ORAL | 3 refills | Status: DC

## 2022-09-17 NOTE — Progress Notes (Signed)
Midge Minium, MD [address]  [address]  [place], [place] [address]  Main: [phone]  Fax: [phone]    Gastroenterology Virtual/Video Visit  Referring Provider:     Duanne Limerick, MD Primary Care Physician:  Duanne Limerick, MD Primary Gastroenterologist:  Dr.Socrates Cahoon Servando Snare Reason for Consultation:     Medication refill        HPI:    Virtual Visit via Video Note Location of the patient: Home Location of provider: Office Participating persons: The patient and myself.  I connected with Betty Walters on 09/17/22 at  3:30 PM EDT by a video enabled telemedicine application and verified that I am speaking with the correct person using two identifiers.   I discussed the limitations of evaluation and management by telemedicine and the availability of in person appointments. The patient expressed understanding and agreed to proceed.  Verbal consent to proceed obtained.  History of Present Illness: Betty Walters is a 62 y.o. female referred by Dr. Duanne Limerick, MD  for consultation & management of IBS.  The patient states that her irritable bowel syndrome has been better and she is under a lot less stress than when her father died last year when she saw me.  The patient states that she has been helped with the pantoprazole and the dicyclomine and needs a refill of these medications.  She does report that certain foods will cause her to have worsening symptoms but other times the same foods are not causing her any problems.  Past Medical History:  Diagnosis Date   Concussion May 2016   Depression    Diabetes mellitus without complication (HCC)    Family history of adverse reaction to anesthesia    Father - slow to wake   GERD (gastroesophageal reflux disease)    High cholesterol    Hypertension    Yeast infection     Past Surgical History:  Procedure Laterality Date   CESAREAN SECTION     COLONOSCOPY WITH PROPOFOL N/A 04/11/2017   Procedure: COLONOSCOPY WITH  colon;  Surgeon: Midge Minium, MD;  Location: [facility];  Service: Endoscopy;  Laterality: N/A;   ESOPHAGOGASTRODUODENOSCOPY (EGD) WITH PROPOFOL N/A 04/11/2017   Procedure: ESOPHAGOGASTRODUODENOSCOPY (EGD) WITH PROPOFOL;  Surgeon: Midge Minium, MD;  Location: [facility];  Service: Endoscopy;  Laterality: N/A;  Diabetic - oral meds   GALLBLADDER SURGERY     KNEE SURGERY     MYRINGOTOMY WITH TUBE PLACEMENT Right 03/22/2020   Procedure: MYRINGOTOMY WITH TUBE PLACEMENT;  Surgeon: Bud Face, MD;  Location: [facility];  Service: ENT;  Laterality: Right;  Diabetic   POLYPECTOMY  04/11/2017   Procedure: POLYPECTOMY INTESTINAL;  Surgeon: Midge Minium, MD;  Location: [facility];  Service: Endoscopy;;   WRIST SURGERY      Prior to Admission medications   Medication Sig Start Date End Date Taking? Authorizing Provider  celecoxib (CELEBREX) 100 MG capsule Take 1 capsule (100 mg total) by mouth 2 (two) times daily. 01/01/22  Yes Felecia Shelling, DPM  cyclobenzaprine (FLEXERIL) 5 MG tablet Take 1 tablet (5 mg total) by mouth at bedtime as needed. 10/11/21  Yes Duanne Limerick, MD  gabapentin (NEURONTIN) 300 MG capsule Take 1 capsule by mouth at bedtime 07/24/22  Yes Felecia Shelling, DPM  Insulin Pen Needle (PEN NEEDLES 3/16") 31G X 5 MM MISC 1 each by Does not apply route daily. 06/01/19  Yes Duanne Limerick, MD  liraglutide Verdis Prime)  18 MG/3ML SOPN INJECT 1.8MG  INTO THE SKIN ONCE A DAY 07/29/22  Yes Duanne Limerick, MD  lisinopril (ZESTRIL) 20 MG tablet Take 1 tablet (20 mg total) by mouth daily. 07/29/22  Yes Duanne Limerick, MD  lovastatin (MEVACOR) 40 MG tablet Take 1 tablet by mouth once daily 07/29/22  Yes Duanne Limerick, MD  metFORMIN (GLUCOPHAGE) 1000 MG tablet Take 1 tablet (1,000 mg total) by mouth 2 (two) times daily. 07/29/22  Yes Duanne Limerick, MD  montelukast (SINGULAIR) 10 MG tablet Take 1 tablet (10 mg total) by mouth at bedtime. 10/11/21  Yes Duanne Limerick,  MD  nystatin cream (MYCOSTATIN) Apply 1 Application topically 2 (two) times daily. 05/30/22  Yes Duanne Limerick, MD  pioglitazone (ACTOS) 15 MG tablet Take 1 tablet daily 07/29/22  Yes Duanne Limerick, MD  sertraline (ZOLOFT) 100 MG tablet TAKE 1 TABLET BY MOUTH ONCE DAILY (ALONG  WITH  50MG   TAB) 07/29/22  Yes Duanne Limerick, MD  sertraline (ZOLOFT) 50 MG tablet TAKE 1 TABLET BY MOUTH ONCE DAILY (ALONG  WITH  100MG   TABS)  - 07/29/22  Yes Duanne Limerick, MD  dicyclomine (BENTYL) 20 MG tablet Take 1 tablet (20 mg total) by mouth 3 (three) times daily before meals. 09/17/22   Midge Minium, MD  pantoprazole (PROTONIX) 40 MG tablet Take 1 tablet (40 mg total) by mouth daily. 09/17/22   Midge Minium, MD    Family History  Problem Relation Age of Onset   Cancer Mother        breast   Diabetes Father      Social History   Tobacco Use   Smoking status: Never   Smokeless tobacco: Never  Vaping Use   Vaping Use: Never used  Substance Use Topics   Alcohol use: Yes    Alcohol/week: 0.0 standard drinks of alcohol    Comment: rare - Holidays   Drug use: No    Allergies as of 09/17/2022 - Review Complete 09/17/2022  Allergen Reaction Noted   Amoxicillin-pot clavulanate Nausea And Vomiting 01/26/2014    Review of Systems:    All systems reviewed and negative except where noted in HPI.   Observations/Objective:  Labs: CBC    Component Value Date/Time   WBC 5.9 12/11/2021 0418   RBC 3.47 (L) 12/11/2021 0418   HGB 9.8 (L) 12/11/2021 0418   HGB 12.5 01/16/2017 1131   HCT 30.8 (L) 12/11/2021 0418   HCT 38.3 01/16/2017 1131   PLT 207 12/11/2021 0418   PLT 216 01/16/2017 1131   MCV 88.8 12/11/2021 0418   MCV 87 01/16/2017 1131   MCH 28.2 12/11/2021 0418   MCHC 31.8 12/11/2021 0418   RDW 13.9 12/11/2021 0418   RDW 14.9 01/16/2017 1131   LYMPHSABS 2.5 12/10/2021 1759   MONOABS 0.6 12/10/2021 1759   EOSABS 0.1 12/10/2021 1759   BASOSABS 0.1 12/10/2021 1759   CMP     Component  Value Date/Time   NA 140 07/29/2022 1632   K 4.4 07/29/2022 1632   CL 105 07/29/2022 1632   CO2 21 07/29/2022 1632   GLUCOSE 160 (H) 07/29/2022 1632   GLUCOSE 161 (H) 12/12/2021 0415   BUN 21 07/29/2022 1632   CREATININE 0.94 07/29/2022 1632   CALCIUM 9.5 07/29/2022 1632   PROT 6.7 07/29/2022 1632   ALBUMIN 4.1 07/29/2022 1632   AST 14 07/29/2022 1632   ALT 14 07/29/2022 1632   ALKPHOS 93 07/29/2022 1632   BILITOT <  0.2 07/29/2022 1632   GFRNONAA >60 12/12/2021 0415   GFRAA 82 04/25/2020 1122    Imaging Studies: No results found.  Assessment and Plan:   Betty Walters is a 62 y.o. y/o female for follow-up of irritable bowel syndrome with diarrhea predominance.  The patient needs a refill of her pantoprazole and her dicyclomine.  The patient will have these refilled.  The patient has been stable and has been told to contact me if her symptoms should worsen.  The patient has been explained the plan and agrees with it.  Follow Up Instructions:  I discussed the assessment and treatment plan with the patient. The patient was provided an opportunity to ask questions and all were answered. The patient agreed with the plan and demonstrated an understanding of the instructions.   The patient was advised to call back or seek an in-person evaluation if the symptoms worsen or if the condition fails to improve as anticipated.  I provided 20 minutes of non-face-to-face time during this encounter including chart review In preparation for the encounter.   Midge Minium, MD  Speech recognition software was used to dictate the above note.

## 2022-09-25 ENCOUNTER — Other Ambulatory Visit: Payer: Self-pay | Admitting: Podiatry

## 2022-09-25 ENCOUNTER — Telehealth: Payer: Self-pay | Admitting: Podiatry

## 2022-09-25 ENCOUNTER — Ambulatory Visit: Payer: BC Managed Care – PPO | Admitting: Gastroenterology

## 2022-09-25 NOTE — Telephone Encounter (Signed)
Pt calling for refill on her gabapentin. Pharmacy is correct in chart.

## 2022-09-30 NOTE — Telephone Encounter (Signed)
Lvm for pt that medication was sent in and to call if any issues.

## 2022-11-12 ENCOUNTER — Telehealth: Payer: BC Managed Care – PPO | Admitting: Family Medicine

## 2022-11-12 DIAGNOSIS — H6691 Otitis media, unspecified, right ear: Secondary | ICD-10-CM

## 2022-11-12 DIAGNOSIS — J0140 Acute pansinusitis, unspecified: Secondary | ICD-10-CM

## 2022-11-12 MED ORDER — AMOXICILLIN 875 MG PO TABS
875.0000 mg | ORAL_TABLET | Freq: Two times a day (BID) | ORAL | 0 refills | Status: AC
Start: 2022-11-12 — End: 2022-11-22

## 2022-11-12 NOTE — Patient Instructions (Addendum)
Gayla Doss, thank you for joining Freddy Finner, NP for today's virtual visit.  While this provider is not your primary care provider (PCP), if your PCP is located in our provider database this encounter information will be shared with them immediately following your visit.   A [facility] MyChart account gives you access to today's visit and all your visits, tests, and labs performed at [facility] " click here if you don't have a [facility] MyChart account or go to [email or web address]  Consent: (Patient) Betty Walters provided verbal consent for this virtual visit at the beginning of the encounter.  Current Medications:  Current Outpatient Medications:    amoxicillin (AMOXIL) 875 MG tablet, Take 1 tablet (875 mg total) by mouth 2 (two) times daily for 10 days., Disp: 20 tablet, Rfl: 0   celecoxib (CELEBREX) 100 MG capsule, Take 1 capsule (100 mg total) by mouth 2 (two) times daily., Disp: 120 capsule, Rfl: 1   cyclobenzaprine (FLEXERIL) 5 MG tablet, Take 1 tablet (5 mg total) by mouth at bedtime as needed., Disp: 30 tablet, Rfl: 5   dicyclomine (BENTYL) 20 MG tablet, Take 1 tablet (20 mg total) by mouth 3 (three) times daily before meals., Disp: 270 tablet, Rfl: 3   gabapentin (NEURONTIN) 300 MG capsule, Take 1 capsule by mouth at bedtime, Disp: 90 capsule, Rfl: 0   Insulin Pen Needle (PEN NEEDLES 3/16") 31G X 5 MM MISC, 1 each by Does not apply route daily., Disp: 100 each, Rfl: 0   liraglutide (VICTOZA) 18 MG/3ML SOPN, INJECT 1.8MG  INTO THE SKIN ONCE A DAY, Disp: 9 mL, Rfl: 1   lisinopril (ZESTRIL) 20 MG tablet, Take 1 tablet (20 mg total) by mouth daily., Disp: 90 tablet, Rfl: 1   lovastatin (MEVACOR) 40 MG tablet, Take 1 tablet by mouth once daily, Disp: 90 tablet, Rfl: 1   metFORMIN (GLUCOPHAGE) 1000 MG tablet, Take 1 tablet (1,000 mg total) by mouth 2 (two) times daily., Disp: 180 tablet, Rfl: 1   montelukast (SINGULAIR) 10 MG tablet, Take 1 tablet (10 mg  total) by mouth at bedtime., Disp: 30 tablet, Rfl: 3   nystatin cream (MYCOSTATIN), Apply 1 Application topically 2 (two) times daily., Disp: 30 g, Rfl: 2   pantoprazole (PROTONIX) 40 MG tablet, Take 1 tablet (40 mg total) by mouth daily., Disp: 90 tablet, Rfl: 3   pioglitazone (ACTOS) 15 MG tablet, Take 1 tablet daily, Disp: 90 tablet, Rfl: 1   sertraline (ZOLOFT) 100 MG tablet, TAKE 1 TABLET BY MOUTH ONCE DAILY (ALONG  WITH  50MG   TAB), Disp: 90 tablet, Rfl: 1   sertraline (ZOLOFT) 50 MG tablet, TAKE 1 TABLET BY MOUTH ONCE DAILY (ALONG  WITH  100MG   TABS)  -, Disp: 90 tablet, Rfl: 1   Medications ordered in this encounter:  Meds ordered this encounter  Medications   amoxicillin (AMOXIL) 875 MG tablet    Sig: Take 1 tablet (875 mg total) by mouth 2 (two) times daily for 10 days.    Dispense:  20 tablet    Refill:  0    Order Specific Question:   Supervising Provider    Answer:   Merrilee Jansky [id number]     *If you need refills on other medications prior to your next appointment, please contact your pharmacy*  Follow-Up: Call back or seek an in-person evaluation if the symptoms worsen or if the condition fails to improve as anticipated.  [facility] [phone]  Other  Instructions  Increased rest - Increasing Fluids - Acetaminophen / ibuprofen as needed for fever/pain.  - - Mucinex if mucus is present and increasing.  - Saline nasal spray if congestion or if nasal passages feel dry. - Humidifying the air.   If you have been instructed to have an in-person evaluation today at a local Urgent Care facility, please use the link below. It will take you to a list of all of our available [facility], including address, phone number and hours of operation. Please do not delay care.  [facility]  If you or a family member do not have a primary care provider, use the link below to schedule a visit and establish care. When you choose a [facility] primary care physician or advanced practice provider, you gain a long-term partner in health. Find a Primary Care Provider  Learn more about [facility] in-office and virtual care options: [place] - Get Care Now

## 2022-11-12 NOTE — Progress Notes (Signed)
Virtual Visit Consent   Betty Walters, you are scheduled for a virtual visit with a [facility] provider today. Just as with appointments in the office, your consent must be obtained to participate. Your consent will be active for this visit and any virtual visit you may have with one of our providers in the next 365 days. If you have a MyChart account, a copy of this consent can be sent to you electronically.  As this is a virtual visit, video technology does not allow for your provider to perform a traditional examination. This may limit your provider's ability to fully assess your condition. If your provider identifies any concerns that need to be evaluated in person or the need to arrange testing (such as labs, EKG, etc.), we will make arrangements to do so. Although advances in technology are sophisticated, we cannot ensure that it will always work on either your end or our end. If the connection with a video visit is poor, the visit may have to be switched to a telephone visit. With either a video or telephone visit, we are not always able to ensure that we have a secure connection.  By engaging in this virtual visit, you consent to the provision of healthcare and authorize for your insurance to be billed (if applicable) for the services provided during this visit. Depending on your insurance coverage, you may receive a charge related to this service.  I need to obtain your verbal consent now. Are you willing to proceed with your visit today? Betty Walters has provided verbal consent on 11/12/2022 for a virtual visit (video or telephone). Freddy Finner, NP  Date: 11/12/2022 3:32 PM  Virtual Visit via Video Note   I, Freddy Finner, connected with  Betty Walters  ([id number], December 12, 1960) on 11/12/22 at  3:30 PM EDT by a video-enabled telemedicine application and verified that I am speaking with the correct person using two identifiers.  Location: Patient: Virtual Visit Location Patient:  Home Provider: Virtual Visit Location Provider: Home Office   I discussed the limitations of evaluation and management by telemedicine and the availability of in person appointments. The patient expressed understanding and agreed to proceed.    History of Present Illness: Betty Walters is a 62 y.o. who identifies as a female who was assigned female at birth, and is being seen today for ear pain on the right  Onset was a week ago with pain in the ear and jaw to neck of the right side- did have a tube in the Right ear it was placed 2 years ago and came out 8 months ago. Associated symptoms are sinus pressure and pain, and shooting pain from ear into the neck, headaches, nasal stuffy Modifying factors are day quil and ny quil and vapor Denies chest pain, shortness of breath, fevers, chills  Exposure to sick contacts- known   Problems:  Patient Active Problem List   Diagnosis Date Noted   Acute pyelonephritis 12/10/2021   AKI (acute kidney injury) (HCC) 12/10/2021   Type 2 diabetes mellitus with peripheral neuropathy (HCC) 12/10/2021   Dyslipidemia 12/10/2021   Depression 12/10/2021   GERD without esophagitis 12/10/2021   Sprain, metatarsophalangeal joint 01/26/2018   Heartburn    Dyspepsia    Change in bowel habits    Benign neoplasm of descending colon    Chronic pain of toes of both feet 09/20/2016   Type 2 diabetes mellitus with complication (HCC) 09/06/2015   Allergic rhinitis due to  pollen 06/21/2015   Concussion with coma 10/21/2014   Clinical depression 09/28/2014   Diabetes mellitus, type 2 (HCC) 09/28/2014   HLD (hyperlipidemia) 09/28/2014   Essential hypertension 09/28/2014   Adiposity 09/28/2014    Allergies:  Allergies  Allergen Reactions   Amoxicillin-Pot Clavulanate Nausea And Vomiting    (OK with Amoxicillin alone)   Medications:  Current Outpatient Medications:    celecoxib (CELEBREX) 100 MG capsule, Take 1 capsule (100 mg total) by mouth 2 (two) times  daily., Disp: 120 capsule, Rfl: 1   cyclobenzaprine (FLEXERIL) 5 MG tablet, Take 1 tablet (5 mg total) by mouth at bedtime as needed., Disp: 30 tablet, Rfl: 5   dicyclomine (BENTYL) 20 MG tablet, Take 1 tablet (20 mg total) by mouth 3 (three) times daily before meals., Disp: 270 tablet, Rfl: 3   gabapentin (NEURONTIN) 300 MG capsule, Take 1 capsule by mouth at bedtime, Disp: 90 capsule, Rfl: 0   Insulin Pen Needle (PEN NEEDLES 3/16") 31G X 5 MM MISC, 1 each by Does not apply route daily., Disp: 100 each, Rfl: 0   liraglutide (VICTOZA) 18 MG/3ML SOPN, INJECT 1.8MG  INTO THE SKIN ONCE A DAY, Disp: 9 mL, Rfl: 1   lisinopril (ZESTRIL) 20 MG tablet, Take 1 tablet (20 mg total) by mouth daily., Disp: 90 tablet, Rfl: 1   lovastatin (MEVACOR) 40 MG tablet, Take 1 tablet by mouth once daily, Disp: 90 tablet, Rfl: 1   metFORMIN (GLUCOPHAGE) 1000 MG tablet, Take 1 tablet (1,000 mg total) by mouth 2 (two) times daily., Disp: 180 tablet, Rfl: 1   montelukast (SINGULAIR) 10 MG tablet, Take 1 tablet (10 mg total) by mouth at bedtime., Disp: 30 tablet, Rfl: 3   nystatin cream (MYCOSTATIN), Apply 1 Application topically 2 (two) times daily., Disp: 30 g, Rfl: 2   pantoprazole (PROTONIX) 40 MG tablet, Take 1 tablet (40 mg total) by mouth daily., Disp: 90 tablet, Rfl: 3   pioglitazone (ACTOS) 15 MG tablet, Take 1 tablet daily, Disp: 90 tablet, Rfl: 1   sertraline (ZOLOFT) 100 MG tablet, TAKE 1 TABLET BY MOUTH ONCE DAILY (ALONG  WITH  50MG   TAB), Disp: 90 tablet, Rfl: 1   sertraline (ZOLOFT) 50 MG tablet, TAKE 1 TABLET BY MOUTH ONCE DAILY (ALONG  WITH  100MG   TABS)  -, Disp: 90 tablet, Rfl: 1  Observations/Objective: Patient is well-developed, well-nourished in no acute distress.  Resting comfortably  at home.  Head is normocephalic, atraumatic.  No labored breathing.  Speech is clear and coherent with logical content.  Patient is alert and oriented at baseline.    Assessment and Plan:  1. Acute ear infection,  right  - amoxicillin (AMOXIL) 875 MG tablet; Take 1 tablet (875 mg total) by mouth 2 (two) times daily for 10 days.  Dispense: 20 tablet; Refill: 0  2. Acute non-recurrent pansinusitis - amoxicillin (AMOXIL) 875 MG tablet; Take 1 tablet (875 mg total) by mouth 2 (two) times daily for 10 days.  Dispense: 20 tablet; Refill: 0   - Increased rest - Increasing Fluids - Acetaminophen / ibuprofen as needed for fever/pain.  - - Mucinex if mucus is present and increasing.  - Saline nasal spray if congestion or if nasal passages feel dry. - Humidifying the air.   Reviewed side effects, risks and benefits of medication.    Patient acknowledged agreement and understanding of the plan.   Past Medical, Surgical, Social History, Allergies, and Medications have been Reviewed.    Follow Up Instructions: I  discussed the assessment and treatment plan with the patient. The patient was provided an opportunity to ask questions and all were answered. The patient agreed with the plan and demonstrated an understanding of the instructions.  A copy of instructions were sent to the patient via MyChart unless otherwise noted below.    The patient was advised to call back or seek an in-person evaluation if the symptoms worsen or if the condition fails to improve as anticipated.  Time:  I spent 10 minutes with the patient via telehealth technology discussing the above problems/concerns.    Freddy Finner, NP

## 2022-11-28 ENCOUNTER — Ambulatory Visit: Payer: BC Managed Care – PPO | Admitting: Family Medicine

## 2022-12-07 ENCOUNTER — Telehealth: Payer: BC Managed Care – PPO | Admitting: Family Medicine

## 2022-12-07 DIAGNOSIS — J019 Acute sinusitis, unspecified: Secondary | ICD-10-CM

## 2022-12-07 MED ORDER — DOXYCYCLINE HYCLATE 100 MG PO TABS
100.0000 mg | ORAL_TABLET | Freq: Two times a day (BID) | ORAL | 0 refills | Status: AC
Start: 2022-12-07 — End: 2022-12-14

## 2022-12-07 MED ORDER — FLUTICASONE PROPIONATE 50 MCG/ACT NA SUSP: 2.0000 | Freq: Every day | NASAL | 0 refills | Status: DC

## 2022-12-07 NOTE — Patient Instructions (Signed)
Gayla Doss, thank you for joining Reed Pandy, PA-C for today's virtual visit.  While this provider is not your primary care provider (PCP), if your PCP is located in our provider database this encounter information will be shared with them immediately following your visit.   A [facility] MyChart account gives you access to today's visit and all your visits, tests, and labs performed at [facility] " click here if you don't have a [facility] MyChart account or go to [email or web address]  Consent: (Patient) Betty Walters provided verbal consent for this virtual visit at the beginning of the encounter.  Current Medications:  Current Outpatient Medications:    doxycycline (VIBRA-TABS) 100 MG tablet, Take 1 tablet (100 mg total) by mouth 2 (two) times daily for 7 days., Disp: 14 tablet, Rfl: 0   fluticasone (FLONASE) 50 MCG/ACT nasal spray, Place 2 sprays into both nostrils daily., Disp: 16 g, Rfl: 0   celecoxib (CELEBREX) 100 MG capsule, Take 1 capsule (100 mg total) by mouth 2 (two) times daily., Disp: 120 capsule, Rfl: 1   cyclobenzaprine (FLEXERIL) 5 MG tablet, Take 1 tablet (5 mg total) by mouth at bedtime as needed., Disp: 30 tablet, Rfl: 5   dicyclomine (BENTYL) 20 MG tablet, Take 1 tablet (20 mg total) by mouth 3 (three) times daily before meals., Disp: 270 tablet, Rfl: 3   gabapentin (NEURONTIN) 300 MG capsule, Take 1 capsule by mouth at bedtime, Disp: 90 capsule, Rfl: 0   Insulin Pen Needle (PEN NEEDLES 3/16") 31G X 5 MM MISC, 1 each by Does not apply route daily., Disp: 100 each, Rfl: 0   liraglutide (VICTOZA) 18 MG/3ML SOPN, INJECT 1.8MG  INTO THE SKIN ONCE A DAY, Disp: 9 mL, Rfl: 1   lisinopril (ZESTRIL) 20 MG tablet, Take 1 tablet (20 mg total) by mouth daily., Disp: 90 tablet, Rfl: 1   lovastatin (MEVACOR) 40 MG tablet, Take 1 tablet by mouth once daily, Disp: 90 tablet, Rfl: 1   metFORMIN (GLUCOPHAGE) 1000 MG tablet, Take 1 tablet (1,000 mg total) by mouth  2 (two) times daily., Disp: 180 tablet, Rfl: 1   montelukast (SINGULAIR) 10 MG tablet, Take 1 tablet (10 mg total) by mouth at bedtime., Disp: 30 tablet, Rfl: 3   nystatin cream (MYCOSTATIN), Apply 1 Application topically 2 (two) times daily., Disp: 30 g, Rfl: 2   pantoprazole (PROTONIX) 40 MG tablet, Take 1 tablet (40 mg total) by mouth daily., Disp: 90 tablet, Rfl: 3   pioglitazone (ACTOS) 15 MG tablet, Take 1 tablet daily, Disp: 90 tablet, Rfl: 1   sertraline (ZOLOFT) 100 MG tablet, TAKE 1 TABLET BY MOUTH ONCE DAILY (ALONG  WITH  50MG   TAB), Disp: 90 tablet, Rfl: 1   sertraline (ZOLOFT) 50 MG tablet, TAKE 1 TABLET BY MOUTH ONCE DAILY (ALONG  WITH  100MG   TABS)  -, Disp: 90 tablet, Rfl: 1   Medications ordered in this encounter:  Meds ordered this encounter  Medications   doxycycline (VIBRA-TABS) 100 MG tablet    Sig: Take 1 tablet (100 mg total) by mouth 2 (two) times daily for 7 days.    Dispense:  14 tablet    Refill:  0   fluticasone (FLONASE) 50 MCG/ACT nasal spray    Sig: Place 2 sprays into both nostrils daily.    Dispense:  16 g    Refill:  0     *If you need refills on other medications prior to your next appointment, please contact your pharmacy*  Follow-Up: Call back or seek an in-person evaluation if the symptoms worsen or if the condition fails to improve as anticipated.  [facility] [phone]  Other Instructions Sinus Infection, Adult A sinus infection, also called sinusitis, is inflammation of your sinuses. Sinuses are hollow spaces in the bones around your face. Your sinuses are located: Around your eyes. In the middle of your forehead. Behind your nose. In your cheekbones. Mucus normally drains out of your sinuses. When your nasal tissues become inflamed or swollen, mucus can become trapped or blocked. This allows bacteria, viruses, and fungi to grow, which leads to infection. Most infections of the sinuses are caused by a virus. A sinus  infection can develop quickly. It can last for up to 4 weeks (acute) or for more than 12 weeks (chronic). A sinus infection often develops after a cold. What are the causes? This condition is caused by anything that creates swelling in the sinuses or stops mucus from draining. This includes: Allergies. Asthma. Infection from bacteria or viruses. Deformities or blockages in your nose or sinuses. Abnormal growths in the nose (nasal polyps). Pollutants, such as chemicals or irritants in the air. Infection from fungi. This is rare. What increases the risk? You are more likely to develop this condition if you: Have a weak body defense system (immune system). Do a lot of swimming or diving. Overuse nasal sprays. Smoke. What are the signs or symptoms? The main symptoms of this condition are pain and a feeling of pressure around the affected sinuses. Other symptoms include: Stuffy nose or congestion that makes it difficult to breathe through your nose. Thick yellow or greenish drainage from your nose. Tenderness, swelling, and warmth over the affected sinuses. A cough that may get worse at night. Decreased sense of smell and taste. Extra mucus that collects in the throat or the back of the nose (postnasal drip) causing a sore throat or bad breath. Tiredness (fatigue). Fever. How is this diagnosed? This condition is diagnosed based on: Your symptoms. Your medical history. A physical exam. Tests to find out if your condition is acute or chronic. This may include: Checking your nose for nasal polyps. Viewing your sinuses using a device that has a light (endoscope). Testing for allergies or bacteria. Imaging tests, such as an MRI or CT scan. In rare cases, a bone biopsy may be done to rule out more serious types of fungal sinus disease. How is this treated? Treatment for a sinus infection depends on the cause and whether your condition is chronic or acute. If caused by a virus, your  symptoms should go away on their own within 10 days. You may be given medicines to relieve symptoms. They include: Medicines that shrink swollen nasal passages (decongestants). A spray that eases inflammation of the nostrils (topical intranasal corticosteroids). Rinses that help get rid of thick mucus in your nose (nasal saline washes). Medicines that treat allergies (antihistamines). Over-the-counter pain relievers. If caused by bacteria, your health care provider may recommend waiting to see if your symptoms improve. Most bacterial infections will get better without antibiotic medicine. You may be given antibiotics if you have: A severe infection. A weak immune system. If caused by narrow nasal passages or nasal polyps, surgery may be needed. Follow these instructions at home: Medicines Take, use, or apply over-the-counter and prescription medicines only as told by your health care provider. These may include nasal sprays. If you were prescribed an antibiotic medicine, take it as told by your health  care provider. Do not stop taking the antibiotic even if you start to feel better. Hydrate and humidify  Drink enough fluid to keep your urine pale yellow. Staying hydrated will help to thin your mucus. Use a cool mist humidifier to keep the humidity level in your home above 50%. Inhale steam for 10-15 minutes, 3-4 times a day, or as told by your health care provider. You can do this in the bathroom while a hot shower is running. Limit your exposure to cool or dry air. Rest Rest as much as possible. Sleep with your head raised (elevated). Make sure you get enough sleep each night. General instructions  Apply a warm, moist washcloth to your face 3-4 times a day or as told by your health care provider. This will help with discomfort. Use nasal saline washes as often as told by your health care provider. Wash your hands often with soap and water to reduce your exposure to germs. If soap and  water are not available, use hand sanitizer. Do not smoke. Avoid being around people who are smoking (secondhand smoke). Keep all follow-up visits. This is important. Contact a health care provider if: You have a fever. Your symptoms get worse. Your symptoms do not improve within 10 days. Get help right away if: You have a severe headache. You have persistent vomiting. You have severe pain or swelling around your face or eyes. You have vision problems. You develop confusion. Your neck is stiff. You have trouble breathing. These symptoms may be an emergency. Get help right away. Call 911. Do not wait to see if the symptoms will go away. Do not drive yourself to the hospital. Summary A sinus infection is soreness and inflammation of your sinuses. Sinuses are hollow spaces in the bones around your face. This condition is caused by nasal tissues that become inflamed or swollen. The swelling traps or blocks the flow of mucus. This allows bacteria, viruses, and fungi to grow, which leads to infection. If you were prescribed an antibiotic medicine, take it as told by your health care provider. Do not stop taking the antibiotic even if you start to feel better. Keep all follow-up visits. This is important. This information is not intended to replace advice given to you by your health care provider. Make sure you discuss any questions you have with your health care provider. Document Revised: 02/20/2021 Document Reviewed: 02/20/2021 Elsevier Patient Education  2024 [employer]    If you have been instructed to have an in-person evaluation today at a local Urgent Care facility, please use the link below. It will take you to a list of all of our available [facility], including address, phone number and hours of operation. Please do not delay care.  [facility]  If you or a family member do not have a primary care provider, use the link below to schedule a visit and  establish care. When you choose a [facility] primary care physician or advanced practice provider, you gain a long-term partner in health. Find a Primary Care Provider  Learn more about [facility]'s in-office and virtual care options: [facility] - Get Care Now

## 2022-12-07 NOTE — Progress Notes (Signed)
Virtual Visit Consent   Betty Walters, you are scheduled for a virtual visit with a [facility] provider today. Just as with appointments in the office, your consent must be obtained to participate. Your consent will be active for this visit and any virtual visit you may have with one of our providers in the next 365 days. If you have a MyChart account, a copy of this consent can be sent to you electronically.  As this is a virtual visit, video technology does not allow for your provider to perform a traditional examination. This may limit your provider's ability to fully assess your condition. If your provider identifies any concerns that need to be evaluated in person or the need to arrange testing (such as labs, EKG, etc.), we will make arrangements to do so. Although advances in technology are sophisticated, we cannot ensure that it will always work on either your end or our end. If the connection with a video visit is poor, the visit may have to be switched to a telephone visit. With either a video or telephone visit, we are not always able to ensure that we have a secure connection.  By engaging in this virtual visit, you consent to the provision of healthcare and authorize for your insurance to be billed (if applicable) for the services provided during this visit. Depending on your insurance coverage, you may receive a charge related to this service.  I need to obtain your verbal consent now. Are you willing to proceed with your visit today? Betty Walters has provided verbal consent on 12/07/2022 for a virtual visit (video or telephone). Reed Pandy, [place]  Date: 12/07/2022 12:08 PM  Virtual Visit via Video Note   I, Reed Pandy, connected with  Betty Walters  ([id number], May 26, 1960) on 12/07/22 at 12:15 PM EDT by a video-enabled telemedicine application and verified that I am speaking with the correct person using two identifiers.  Location: Patient: Virtual Visit Location Patient:  Home Provider: Virtual Visit Location Provider: Home Office   I discussed the limitations of evaluation and management by telemedicine and the availability of in person appointments. The patient expressed understanding and agreed to proceed.    History of Present Illness: Betty Walters is a 62 y.o. who identifies as a female who was assigned female at birth, and is being seen today for c/o I have been fighting a sinus and ear pressure.  Pt states when she coughs up sputum it is yellow.  Pt states symptoms on going for 1.5-2 weeks.  Pt states taking Mucinex, cough drops.  Pt states no one around her at home sick but she works at school.  Pt denies fever.  Pt states she is congested in her nose.  Pt states feels like she is in a big hole.   HPI: HPI  Problems:  Patient Active Problem List   Diagnosis Date Noted   Acute pyelonephritis 12/10/2021   AKI (acute kidney injury) (HCC) 12/10/2021   Type 2 diabetes mellitus with peripheral neuropathy (HCC) 12/10/2021   Dyslipidemia 12/10/2021   Depression 12/10/2021   GERD without esophagitis 12/10/2021   Sprain, metatarsophalangeal joint 01/26/2018   Heartburn    Dyspepsia    Change in bowel habits    Benign neoplasm of descending colon    Chronic pain of toes of both feet 09/20/2016   Type 2 diabetes mellitus with complication (HCC) 09/06/2015   Allergic rhinitis due to pollen 06/21/2015   Concussion with coma 10/21/2014  Clinical depression 09/28/2014   Diabetes mellitus, type 2 (HCC) 09/28/2014   HLD (hyperlipidemia) 09/28/2014   Essential hypertension 09/28/2014   Adiposity 09/28/2014    Allergies:  Allergies  Allergen Reactions   Amoxicillin-Pot Clavulanate Nausea And Vomiting    (OK with Amoxicillin alone)   Medications:  Current Outpatient Medications:    doxycycline (VIBRA-TABS) 100 MG tablet, Take 1 tablet (100 mg total) by mouth 2 (two) times daily for 7 days., Disp: 14 tablet, Rfl: 0   fluticasone (FLONASE) 50 MCG/ACT  nasal spray, Place 2 sprays into both nostrils daily., Disp: 16 g, Rfl: 0   celecoxib (CELEBREX) 100 MG capsule, Take 1 capsule (100 mg total) by mouth 2 (two) times daily., Disp: 120 capsule, Rfl: 1   cyclobenzaprine (FLEXERIL) 5 MG tablet, Take 1 tablet (5 mg total) by mouth at bedtime as needed., Disp: 30 tablet, Rfl: 5   dicyclomine (BENTYL) 20 MG tablet, Take 1 tablet (20 mg total) by mouth 3 (three) times daily before meals., Disp: 270 tablet, Rfl: 3   gabapentin (NEURONTIN) 300 MG capsule, Take 1 capsule by mouth at bedtime, Disp: 90 capsule, Rfl: 0   Insulin Pen Needle (PEN NEEDLES 3/16") 31G X 5 MM MISC, 1 each by Does not apply route daily., Disp: 100 each, Rfl: 0   liraglutide (VICTOZA) 18 MG/3ML SOPN, INJECT 1.8MG  INTO THE SKIN ONCE A DAY, Disp: 9 mL, Rfl: 1   lisinopril (ZESTRIL) 20 MG tablet, Take 1 tablet (20 mg total) by mouth daily., Disp: 90 tablet, Rfl: 1   lovastatin (MEVACOR) 40 MG tablet, Take 1 tablet by mouth once daily, Disp: 90 tablet, Rfl: 1   metFORMIN (GLUCOPHAGE) 1000 MG tablet, Take 1 tablet (1,000 mg total) by mouth 2 (two) times daily., Disp: 180 tablet, Rfl: 1   montelukast (SINGULAIR) 10 MG tablet, Take 1 tablet (10 mg total) by mouth at bedtime., Disp: 30 tablet, Rfl: 3   nystatin cream (MYCOSTATIN), Apply 1 Application topically 2 (two) times daily., Disp: 30 g, Rfl: 2   pantoprazole (PROTONIX) 40 MG tablet, Take 1 tablet (40 mg total) by mouth daily., Disp: 90 tablet, Rfl: 3   pioglitazone (ACTOS) 15 MG tablet, Take 1 tablet daily, Disp: 90 tablet, Rfl: 1   sertraline (ZOLOFT) 100 MG tablet, TAKE 1 TABLET BY MOUTH ONCE DAILY (ALONG  WITH  50MG   TAB), Disp: 90 tablet, Rfl: 1   sertraline (ZOLOFT) 50 MG tablet, TAKE 1 TABLET BY MOUTH ONCE DAILY (ALONG  WITH  100MG   TABS)  -, Disp: 90 tablet, Rfl: 1  Observations/Objective: Patient is well-developed, well-nourished in no acute distress.  Resting comfortably at home.  Head is normocephalic, atraumatic.  No  labored breathing.  Speech is clear and coherent with logical content.  Patient is alert and oriented at baseline.    Assessment and Plan: 1. Acute sinusitis, recurrence not specified, unspecified location - doxycycline (VIBRA-TABS) 100 MG tablet; Take 1 tablet (100 mg total) by mouth 2 (two) times daily for 7 days.  Dispense: 14 tablet; Refill: 0 - fluticasone (FLONASE) 50 MCG/ACT nasal spray; Place 2 sprays into both nostrils daily.  Dispense: 16 g; Refill: 0  -Pt unable to take Augmentin.  Start Doxycyline and Flonase -Continue mucinex -Advised Pt to F/U with PCP for worsening symptoms.   Follow Up Instructions: I discussed the assessment and treatment plan with the patient. The patient was provided an opportunity to ask questions and all were answered. The patient agreed with the plan and demonstrated an  understanding of the instructions.  A copy of instructions were sent to the patient via MyChart unless otherwise noted below.     The patient was advised to call back or seek an in-person evaluation if the symptoms worsen or if the condition fails to improve as anticipated.  Time:  I spent 15 minutes with the patient via telehealth technology discussing the above problems/concerns.    Reed Pandy, PA-C

## 2022-12-25 ENCOUNTER — Other Ambulatory Visit: Payer: Self-pay | Admitting: Podiatry

## 2023-01-04 ENCOUNTER — Emergency Department
Admission: EM | Admit: 2023-01-04 | Discharge: 2023-01-05 | Disposition: A | Discharge status: Home / Self Care | Payer: BC Managed Care – PPO | Attending: Emergency Medicine | Admitting: Emergency Medicine

## 2023-01-04 ENCOUNTER — Emergency Department: Payer: BC Managed Care – PPO

## 2023-01-04 ENCOUNTER — Other Ambulatory Visit: Payer: Self-pay

## 2023-01-04 DIAGNOSIS — E119 Type 2 diabetes mellitus without complications: Secondary | ICD-10-CM | POA: Insufficient documentation

## 2023-01-04 DIAGNOSIS — R112 Nausea with vomiting, unspecified: Secondary | ICD-10-CM | POA: Diagnosis not present

## 2023-01-04 DIAGNOSIS — I10 Essential (primary) hypertension: Secondary | ICD-10-CM | POA: Insufficient documentation

## 2023-01-04 DIAGNOSIS — R7989 Other specified abnormal findings of blood chemistry: Secondary | ICD-10-CM | POA: Diagnosis not present

## 2023-01-04 DIAGNOSIS — R748 Abnormal levels of other serum enzymes: Secondary | ICD-10-CM | POA: Insufficient documentation

## 2023-01-04 DIAGNOSIS — R1013 Epigastric pain: Secondary | ICD-10-CM | POA: Insufficient documentation

## 2023-01-04 LAB — BASIC METABOLIC PANEL
Anion gap: 14 (ref 5–15)
BUN: 14 mg/dL (ref 6–20)
CO2: 17 mmol/L — ABNORMAL LOW (ref 22–32)
Calcium: 10 mg/dL (ref 8.9–10.3)
Chloride: 105 mmol/L (ref 98–111)
Creatinine, Ser: 0.88 mg/dL (ref 0.44–1.00)
GFR, Estimated: >60 mL/min (ref >60)
Glucose, Bld: 153 mg/dL — ABNORMAL HIGH (ref 70–99)
Potassium: 3.8 mmol/L (ref 3.5–5.1)
Sodium: 136 mmol/L (ref 135–145)

## 2023-01-04 LAB — CBC
HCT: 36.6 % (ref 36.0–46.0)
Hemoglobin: 11.8 g/dL — ABNORMAL LOW (ref 12.0–15.0)
MCH: 29.1 pg (ref 26.0–34.0)
MCHC: 32.2 g/dL (ref 30.0–36.0)
MCV: 90.1 fL (ref 80.0–100.0)
Platelets: 296 10*3/uL (ref 150–400)
RBC: 4.06 MIL/uL (ref 3.87–5.11)
RDW: 13.2 % (ref 11.5–15.5)
WBC: 10.7 10*3/uL — ABNORMAL HIGH (ref 4.0–10.5)
nRBC: 0 % (ref 0.0–0.2)

## 2023-01-04 LAB — TROPONIN I (HIGH SENSITIVITY)
Troponin I (High Sensitivity): 3 ng/L (ref <18)
Troponin I (High Sensitivity): 4 ng/L (ref <18)

## 2023-01-04 LAB — LIPASE, BLOOD: Lipase: 57 U/L — ABNORMAL HIGH (ref 11–51)

## 2023-01-04 MED ORDER — ONDANSETRON HCL 4 MG/2ML IJ SOLN
4.0000 mg | Freq: Once | INTRAMUSCULAR | Status: AC
  Administered 2023-01-04: 4 mg via INTRAVENOUS
  Filled 2023-01-04: qty 2

## 2023-01-04 MED ORDER — FAMOTIDINE IN NACL 20-0.9 MG/50ML-% IV SOLN
20.0000 mg | Freq: Once | INTRAVENOUS | Status: AC
  Administered 2023-01-04: 20 mg via INTRAVENOUS
  Filled 2023-01-04: qty 50

## 2023-01-04 MED ORDER — IOHEXOL 350 MG/ML SOLN
100.0000 mL | Freq: Once | INTRAVENOUS | Status: AC | PRN
  Administered 2023-01-04: 100 mL via INTRAVENOUS

## 2023-01-04 MED ORDER — METOCLOPRAMIDE HCL 5 MG/ML IJ SOLN
10.0000 mg | Freq: Once | INTRAMUSCULAR | Status: AC
  Administered 2023-01-04: 10 mg via INTRAVENOUS
  Filled 2023-01-04: qty 2

## 2023-01-04 MED ORDER — MORPHINE SULFATE (PF) 4 MG/ML IV SOLN
4.0000 mg | Freq: Once | INTRAVENOUS | Status: AC
  Administered 2023-01-04: 4 mg via INTRAVENOUS
  Filled 2023-01-04: qty 1

## 2023-01-04 NOTE — ED Triage Notes (Addendum)
Pt c/o epigastric pain that started around 5pm today that radiates into the back, pt reports IBS and diarrhea yesterday. Pt threw up today. Pt AOX4, tearful in triage. Pt does not have gallbladder.

## 2023-01-04 NOTE — ED Notes (Signed)
Pt to CT

## 2023-01-04 NOTE — ED Notes (Signed)
Pt expressed feeling "lightheaded" after morphine administration. This nurse advised pt that this is a normal finding after receiving morphine. This nurse assessed vitals at bed side and made pt aware that her vitals were reassuring and to take deep breaths. Emesis bag provided, call light within reach.

## 2023-01-05 LAB — HEPATIC FUNCTION PANEL
ALT: 48 U/L — ABNORMAL HIGH (ref 0–44)
AST: 81 U/L — ABNORMAL HIGH (ref 15–41)
Albumin: 4.2 g/dL (ref 3.5–5.0)
Alkaline Phosphatase: 152 U/L — ABNORMAL HIGH (ref 38–126)
Bilirubin, Direct: 0.2 mg/dL (ref 0.0–0.2)
Indirect Bilirubin: 0.5 mg/dL (ref 0.3–0.9)
Total Bilirubin: 0.7 mg/dL (ref 0.3–1.2)
Total Protein: 7.6 g/dL (ref 6.5–8.1)

## 2023-01-05 MED ORDER — SODIUM CHLORIDE 0.9 % IV BOLUS
500.0000 mL | Freq: Once | INTRAVENOUS | Status: AC
  Administered 2023-01-05: 500 mL via INTRAVENOUS

## 2023-01-05 MED ORDER — HYDROCODONE-ACETAMINOPHEN 5-325 MG PO TABS: 2.0000 | ORAL_TABLET | Freq: Three times a day (TID) | ORAL | 0 refills | Status: DC | PRN

## 2023-01-05 MED ORDER — HYDROMORPHONE HCL 1 MG/ML IJ SOLN
1.0000 mg | Freq: Once | INTRAMUSCULAR | Status: AC
  Administered 2023-01-05: 1 mg via INTRAVENOUS
  Filled 2023-01-05: qty 1

## 2023-01-05 MED ORDER — HYDROCODONE-ACETAMINOPHEN 5-325 MG PO TABS
2.0000 | ORAL_TABLET | Freq: Once | ORAL | Status: AC
  Administered 2023-01-05: 2 via ORAL
  Filled 2023-01-05: qty 2

## 2023-01-05 MED ORDER — ONDANSETRON 4 MG PO TBDP
4.0000 mg | ORAL_TABLET | Freq: Four times a day (QID) | ORAL | 0 refills | Status: AC | PRN
Start: 2023-01-05 — End: ?

## 2023-01-05 NOTE — ED Provider Notes (Addendum)
[facility] Provider Note    Event Date/Time   First MD Initiated Contact with Patient 01/04/23 2148     (approximate)   History   Chest Pain and Abdominal Pain   HPI  Betty Walters is a 62 y.o. female with a history of type 2 diabetes, hyperlipidemia, hypertension, GERD, and IBS who presents with epigastric abdominal pain, acute onset around 5 PM, persistent since then, nonradiating.  The patient has associated nausea and had 1 episode of vomiting.  She denies any diarrhea today, although had a flareup of her IBS with diarrhea yesterday and took Imodium.  She denies any chest pain or difficulty breathing.  She has no fever or chills.  I reviewed the past medical records.  The patient's most recent outpatient encounter was with family medicine on 9/7 for acute sinusitis.   Physical Exam   Triage Vital Signs: ED Triage Vitals  Encounter Vitals Group     BP 01/04/23 1942 (!) 145/61     Systolic BP Percentile --      Diastolic BP Percentile --      Pulse Rate 01/04/23 1942 93     Resp 01/04/23 1942 18     Temp 01/04/23 1950 98.3 F (36.8 C)     Temp Source 01/04/23 1950 Oral     SpO2 01/04/23 1942 100 %     Weight --      Height --      Head Circumference --      Peak Flow --      Pain Score 01/04/23 1942 10     Pain Loc --      Pain Education --      Exclude from Growth Chart --     Most recent vital signs: Vitals:   01/04/23 1950 01/04/23 2230  BP:  (!) 130/56  Pulse:  74  Resp:  11  Temp: 98.3 F (36.8 C) 98.2 F (36.8 C)  SpO2:  99%     General: Alert, uncomfortable appearing, no distress.  CV:  Good peripheral perfusion.  Resp:  Normal effort.  Abd:  Soft with mild epigastric tenderness.  No distention.  Other:  No jaundice or scleral icterus.   ED Results / Procedures / Treatments   Labs (all labs ordered are listed, but only abnormal results are displayed) Labs Reviewed  BASIC METABOLIC PANEL - Abnormal; Notable for  the following components:      Result Value   CO2 17 (*)    Glucose, Bld 153 (*)    All other components within normal limits  CBC - Abnormal; Notable for the following components:   WBC 10.7 (*)    Hemoglobin 11.8 (*)    All other components within normal limits  LIPASE, BLOOD - Abnormal; Notable for the following components:   Lipase 57 (*)    All other components within normal limits  HEPATIC FUNCTION PANEL  TROPONIN I (HIGH SENSITIVITY)  TROPONIN I (HIGH SENSITIVITY)     EKG  ED ECG REPORT I, Dionne Bucy, the attending physician, personally viewed and interpreted this ECG.  Date: 01/05/2023 EKG Time: 1946 Rate: 87 Rhythm: normal sinus rhythm QRS Axis: normal Intervals: normal ST/T Wave abnormalities: Nonspecific ST abnormality Narrative Interpretation: no evidence of acute ischemia    RADIOLOGY  Chest x-ray: I independently viewed and interpreted the images; there is no focal consolidation or edema  CT abdomen/pelvis:  IMPRESSION:  No acute abnormality in the abdomen or pelvis.  PROCEDURES:  Critical Care performed: No  Procedures   MEDICATIONS ORDERED IN ED: Medications  HYDROmorphone (DILAUDID) injection 1 mg (has no administration in time range)  sodium chloride 0.9 % bolus 500 mL (has no administration in time range)  ondansetron (ZOFRAN) injection 4 mg (4 mg Intravenous Given 01/04/23 2156)  morphine (PF) 4 MG/ML injection 4 mg (4 mg Intravenous Given 01/04/23 2157)  famotidine (PEPCID) IVPB 20 mg premix (0 mg Intravenous Stopped 01/04/23 2340)  metoCLOPramide (REGLAN) injection 10 mg (10 mg Intravenous Given 01/04/23 2326)  iohexol (OMNIPAQUE) 350 MG/ML injection 100 mL (100 mLs Intravenous Contrast Given 01/04/23 2253)     IMPRESSION / MDM / ASSESSMENT AND PLAN / ED COURSE  I reviewed the triage vital signs and the nursing notes.  62 year old female with PMH as noted above presents with acute onset of epigastric pain and 1 episode of  vomiting earlier today.  She has mild epigastric tenderness on exam.  Differential diagnosis includes, but is not limited to, gastritis, GERD, PUD, gastroparesis, gastroenteritis, pancreatitis, other hepatobiliary cause.  However, the patient is status post cholecystectomy.  There is no evidence of cardiac etiology.  Patient's presentation is most consistent with acute complicated illness / injury requiring diagnostic workup.  The patient is on the cardiac monitor to evaluate for evidence of arrhythmia and/or significant heart rate changes.  Initial lab workup is unremarkable; troponin is negative.  CBC shows no leukocytosis, BMP shows no significant electrolyte abnormalities.  Lipase is minimally elevated.  I have added on LFTs and we will obtain a CT abdomen as well.  ----------------------------------------- 12:23 AM on 01/05/2023 -----------------------------------------  CT shows no acute findings.  LFTs are pending.  The patient is still having moderate pain and nausea.  I have ordered additional pain medication.  I have signed her out to the oncoming ED physician Dr. Elesa Massed.   FINAL CLINICAL IMPRESSION(S) / ED DIAGNOSES   Final diagnoses:  Epigastric pain     Rx / DC Orders   ED Discharge Orders     None        Note:  This document was prepared using Dragon voice recognition software and may include unintentional dictation errors.    Dionne Bucy, MD 01/05/23 1610    Dionne Bucy, MD 01/05/23 [phone]

## 2023-01-05 NOTE — ED Provider Notes (Signed)
1:22 AM  Assumed care of patient at shift change.  Patient here with epigastric abdominal pain.  Suspect possible gastritis.  Status postcholecystectomy.  CT of the abdomen pelvis unremarkable.  Lipase, LFTs minimally elevated but normal total bilirubin.  Low suspicion for choledocholithiasis.  Patient reports pain markedly improved with Dilaudid.  She is already on Bentyl and PPI at home.  She has a gastroenterologist and PCP for follow-up.  Will discharge with pain and nausea medicine and recommended bland diet for several days.   Ezra Denne, Layla Maw, DO 01/05/23 [phone]

## 2023-01-05 NOTE — Discharge Instructions (Addendum)
Please follow-up with your gastroenterologist and PCP.  Please continue your dicyclomine and pantoprazole as prescribed.   Please avoid NSAIDs such as aspirin (Goody powders), ibuprofen (Motrin, Advil), naproxen (Aleve) as these may worsen your symptoms.  Tylenol 1000 mg every 6 hours is safe to take as long as you have no history of liver problems (heavy alcohol use, cirrhosis, hepatitis).  Please avoid spicy, acidic (citrus fruits, tomato based sauces, salsa), greasy, fatty foods.  Please avoid caffeine and alcohol.  Smoking can also make GERD/acid reflux worse.  Over the counter medications such as TUMS, Maalox or Mylanta, pepcid, Prilosec or Nexium may help with your symptoms.  Do not take Prilosec or Nexium if you are already prescribed a proton pump inhibitor.

## 2023-01-06 ENCOUNTER — Encounter: Payer: Self-pay | Admitting: Gastroenterology

## 2023-01-06 ENCOUNTER — Encounter: Payer: Self-pay | Admitting: Family Medicine

## 2023-01-06 ENCOUNTER — Telehealth: Payer: Self-pay

## 2023-01-06 DIAGNOSIS — R748 Abnormal levels of other serum enzymes: Secondary | ICD-10-CM

## 2023-01-06 NOTE — Addendum Note (Signed)
Addended by: Roena Malady on: 01/06/2023 02:28 PM   Modules accepted: Orders

## 2023-01-06 NOTE — Telephone Encounter (Signed)
I called pt and talked to her about having the repeat labs per Dr Annabell Sabal recommendation. Pt explained that she was never told about the abnormal labs and had questions. I explained to the patient what the abnormal labs were related to... pt explained that she has a hosp f/u with her PCP this Thurs... I scheduled pt with Dr Servando Snare for 10/17 at 1pm in the meantime... Pt states that she will let me know if she changes her mind   Pt reports that she is feeling better overall

## 2023-01-07 ENCOUNTER — Telehealth: Payer: Self-pay

## 2023-01-07 NOTE — Transitions of Care (Post Inpatient/ED Visit) (Signed)
01/07/2023  Name: Betty Walters MRN: [id number] DOB: [birth year]  Today's TOC FU Call Status: Today's TOC FU Call Status:: Successful TOC FU Call Completed TOC FU Call Complete Date: 01/07/23 Patient's Name and Date of Birth confirmed.  Transition Care Management Follow-up Telephone Call Date of Discharge: 01/05/23 Discharge Facility: [facility]) Type of Discharge: Emergency Department Reason for ED Visit: Other: How have you been since you were released from the hospital?: Better Any questions or concerns?: No  Items Reviewed: Did you receive and understand the discharge instructions provided?: Yes Medications obtained,verified, and reconciled?: Yes (Medications Reviewed) Any new allergies since your discharge?: No Dietary orders reviewed?: No Do you have support at home?: Yes People in Home: spouse  Medications Reviewed Today: Medications Reviewed Today   Medications were not reviewed in this encounter     Home Care and Equipment/Supplies: Were Home Health Services Ordered?: No Any new equipment or medical supplies ordered?: No  Functional Questionnaire: Do you need assistance with bathing/showering or dressing?: No Do you need assistance with meal preparation?: No Do you need assistance with eating?: No Do you have difficulty maintaining continence: No Do you need assistance with getting out of bed/getting out of a chair/moving?: No Do you have difficulty managing or taking your medications?: No  Follow up appointments reviewed: PCP Follow-up appointment confirmed?: Yes Date of PCP follow-up appointment?: 01/09/23 Follow-up Provider: Dub Amis Specialist [facility] Follow-up appointment confirmed?: Yes Date of Specialist follow-up appointment?: 01/16/23 Follow-Up Specialty Provider:: Servando Snare Do you need transportation to your follow-up appointment?: No Do you understand care options if your condition(s) worsen?: Yes-patient verbalized  understanding    SIGNATURE

## 2023-01-09 ENCOUNTER — Ambulatory Visit (INDEPENDENT_AMBULATORY_CARE_PROVIDER_SITE_OTHER): Payer: BC Managed Care – PPO | Admitting: Family Medicine

## 2023-01-09 ENCOUNTER — Encounter: Payer: Self-pay | Admitting: Family Medicine

## 2023-01-09 VITALS — BP 122/74 | HR 91 | Ht 68.0 in | Wt 225.8 lb

## 2023-01-09 DIAGNOSIS — K2900 Acute gastritis without bleeding: Secondary | ICD-10-CM | POA: Diagnosis not present

## 2023-01-09 DIAGNOSIS — R748 Abnormal levels of other serum enzymes: Secondary | ICD-10-CM

## 2023-01-09 DIAGNOSIS — R1013 Epigastric pain: Secondary | ICD-10-CM | POA: Diagnosis not present

## 2023-01-09 NOTE — Progress Notes (Signed)
Date:  01/09/2023   Name:  Betty Walters   DOB:  01-15-1961   MRN:  [id number]   Chief Complaint: Hospitalization Follow-up (Went to the ER on [facility]. Had epigastric pain. Patient does have history of IBS. This felt like a gallbladder attack even though she does not have a gallbladder anymore. Was told she had elevated liver enzymes, and needs those rechecked. Has appt with Dr Servando Snare next Thursday. )  Patient is a 62year old female who presents for a HRT exam. The patient reports the following problems: Anxiety which is baseline as well as of a panic disorder.. Health maintenance has been reviewed up to date.    Abdominal Pain This is a new problem. The current episode started in the past 7 days. The problem has been waxing and waning. The pain is located in the epigastric region. The pain is moderate. The quality of the pain is colicky and sharp. The abdominal pain radiates to the back. Pertinent negatives include no arthralgias, constipation, flatus, frequency, headaches, melena, nausea or vomiting. Nothing aggravates the pain. The pain is relieved by Nothing. She has tried proton pump inhibitors for the symptoms. The treatment provided moderate relief. Her past medical history is significant for irritable bowel syndrome. There is no history of pancreatitis.    Lab Results  Component Value Date   NA 136 01/04/2023   K 3.8 01/04/2023   CO2 17 (L) 01/04/2023   GLUCOSE 153 (H) 01/04/2023   BUN 14 01/04/2023   CREATININE 0.88 01/04/2023   CALCIUM 10.0 01/04/2023   EGFR 70 07/29/2022   GFRNONAA >60 01/04/2023   Lab Results  Component Value Date   CHOL 198 10/11/2021   HDL 58 10/11/2021   LDLCALC 110 (H) 10/11/2021   TRIG 171 (H) 10/11/2021   CHOLHDL 4.6 (H) 08/05/2018   No results found for: "TSH" Lab Results  Component Value Date   HGBA1C 7.7 (H) 07/29/2022   Lab Results  Component Value Date   WBC 10.7 (H) 01/04/2023   HGB 11.8 (L) 01/04/2023   HCT 36.6 01/04/2023   MCV  90.1 01/04/2023   PLT 296 01/04/2023   Lab Results  Component Value Date   ALT 48 (H) 01/04/2023   AST 81 (H) 01/04/2023   ALKPHOS 152 (H) 01/04/2023   BILITOT 0.7 01/04/2023   No results found for: "25OHVITD2", "25OHVITD3", "VD25OH"   Review of Systems  Respiratory:  Negative for choking, chest tightness, shortness of breath, wheezing and stridor.   Cardiovascular:  Negative for palpitations and leg swelling.  Gastrointestinal:  Positive for abdominal pain. Negative for blood in stool, constipation, flatus, melena, nausea and vomiting.  Genitourinary:  Negative for frequency.  Musculoskeletal:  Negative for arthralgias.  Neurological:  Negative for headaches.    Patient Active Problem List   Diagnosis Date Noted   Acute pyelonephritis 12/10/2021   AKI (acute kidney injury) (HCC) 12/10/2021   Type 2 diabetes mellitus with peripheral neuropathy (HCC) 12/10/2021   Dyslipidemia 12/10/2021   Depression 12/10/2021   GERD without esophagitis 12/10/2021   Sprain, metatarsophalangeal joint 01/26/2018   Heartburn    Dyspepsia    Change in bowel habits    Benign neoplasm of descending colon    Chronic pain of toes of both feet 09/20/2016   Type 2 diabetes mellitus with complication (HCC) 09/06/2015   Allergic rhinitis due to pollen 06/21/2015   Concussion with coma 10/21/2014   Clinical depression 09/28/2014   Diabetes mellitus, type 2 (HCC) 09/28/2014  HLD (hyperlipidemia) 09/28/2014   Essential hypertension 09/28/2014   Adiposity 09/28/2014    Allergies  Allergen Reactions   Amoxicillin-Pot Clavulanate Nausea And Vomiting    (OK with Amoxicillin alone)    Past Surgical History:  Procedure Laterality Date   CESAREAN SECTION     COLONOSCOPY WITH PROPOFOL N/A 04/11/2017   Procedure: COLONOSCOPY WITH colon;  Surgeon: Midge Minium, MD;  Location: [facility];  Service: Endoscopy;  Laterality: N/A;   ESOPHAGOGASTRODUODENOSCOPY (EGD) WITH PROPOFOL N/A 04/11/2017    Procedure: ESOPHAGOGASTRODUODENOSCOPY (EGD) WITH PROPOFOL;  Surgeon: Midge Minium, MD;  Location: [facility];  Service: Endoscopy;  Laterality: N/A;  Diabetic - oral meds   GALLBLADDER SURGERY     KNEE SURGERY     MYRINGOTOMY WITH TUBE PLACEMENT Right 03/22/2020   Procedure: MYRINGOTOMY WITH TUBE PLACEMENT;  Surgeon: Bud Face, MD;  Location: [facility];  Service: ENT;  Laterality: Right;  Diabetic   POLYPECTOMY  04/11/2017   Procedure: POLYPECTOMY INTESTINAL;  Surgeon: Midge Minium, MD;  Location: [facility];  Service: Endoscopy;;   WRIST SURGERY      Social History   Tobacco Use   Smoking status: Never   Smokeless tobacco: Never  Vaping Use   Vaping status: Never Used  Substance Use Topics   Alcohol use: Yes    Alcohol/week: 0.0 standard drinks of alcohol    Comment: rare - Holidays   Drug use: No     Medication list has been reviewed and updated.  Current Meds  Medication Sig   celecoxib (CELEBREX) 100 MG capsule Take 1 capsule (100 mg total) by mouth 2 (two) times daily.   cyclobenzaprine (FLEXERIL) 5 MG tablet Take 1 tablet (5 mg total) by mouth at bedtime as needed.   dicyclomine (BENTYL) 20 MG tablet Take 1 tablet (20 mg total) by mouth 3 (three) times daily before meals.   fluticasone (FLONASE) 50 MCG/ACT nasal spray Place 2 sprays into both nostrils daily.   gabapentin (NEURONTIN) 300 MG capsule Take 1 capsule by mouth at bedtime   Insulin Pen Needle (PEN NEEDLES 3/16") 31G X 5 MM MISC 1 each by Does not apply route daily.   liraglutide (VICTOZA) 18 MG/3ML SOPN INJECT 1.8MG  INTO THE SKIN ONCE A DAY   lisinopril (ZESTRIL) 20 MG tablet Take 1 tablet (20 mg total) by mouth daily.   lovastatin (MEVACOR) 40 MG tablet Take 1 tablet by mouth once daily   metFORMIN (GLUCOPHAGE) 1000 MG tablet Take 1 tablet (1,000 mg total) by mouth 2 (two) times daily.   montelukast (SINGULAIR) 10 MG tablet Take 1 tablet (10 mg total) by mouth at bedtime.    nystatin cream (MYCOSTATIN) Apply 1 Application topically 2 (two) times daily.   pantoprazole (PROTONIX) 40 MG tablet Take 1 tablet (40 mg total) by mouth daily.   pioglitazone (ACTOS) 15 MG tablet Take 1 tablet daily   sertraline (ZOLOFT) 100 MG tablet TAKE 1 TABLET BY MOUTH ONCE DAILY (ALONG  WITH  50MG   TAB)   sertraline (ZOLOFT) 50 MG tablet TAKE 1 TABLET BY MOUTH ONCE DAILY (ALONG  WITH  100MG   TABS)  -       01/09/2023    2:38 PM 07/29/2022    3:51 PM 05/30/2022    1:52 PM 02/25/2022    4:09 PM  GAD 7 : Generalized Anxiety Score  Nervous, Anxious, on Edge 0 0 0 0  Control/stop worrying 1 0 0 0  Worry too much - different things 1  0 0 0  Trouble relaxing 0 0 0 0  Restless 1 0 0 0  Easily annoyed or irritable 0 0 0 0  Afraid - awful might happen 0 0 0 0  Total GAD 7 Score 3 0 0 0  Anxiety Difficulty Not difficult at all Not difficult at all Not difficult at all Not difficult at all       01/09/2023    2:37 PM 07/29/2022    3:50 PM 05/30/2022    1:51 PM  Depression screen PHQ 2/9  Decreased Interest 1 0 0  Down, Depressed, Hopeless 0 0 0  PHQ - 2 Score 1 0 0  Altered sleeping 1 0 0  Tired, decreased energy 1 0 0  Change in appetite 2 0 0  Feeling bad or failure about yourself  0 0 0  Trouble concentrating 0 0 0  Moving slowly or fidgety/restless 0 0 0  Suicidal thoughts 0 0 0  PHQ-9 Score 5 0 0  Difficult doing work/chores Not difficult at all Not difficult at all Not difficult at all    BP Readings from Last 3 Encounters:  01/09/23 122/74  01/04/23 (!) 130/56  07/29/22 120/76    Physical Exam Vitals and nursing note reviewed. Exam conducted with a chaperone present.  Constitutional:      General: She is not in acute distress.    Appearance: She is not diaphoretic.  HENT:     Head: Normocephalic and atraumatic.     Right Ear: Tympanic membrane, ear canal and external ear normal.     Left Ear: Tympanic membrane, ear canal and external ear normal.     Nose:  Nose normal.  Eyes:     General:        Right eye: No discharge.        Left eye: No discharge.     Conjunctiva/sclera: Conjunctivae normal.     Pupils: Pupils are equal, round, and reactive to light.  Neck:     Thyroid: No thyromegaly.     Vascular: No JVD.  Cardiovascular:     Rate and Rhythm: Normal rate and regular rhythm.     Heart sounds: Normal heart sounds. No murmur heard.    No friction rub. No gallop.  Pulmonary:     Effort: Pulmonary effort is normal.     Breath sounds: Normal breath sounds. No wheezing, rhonchi or rales.  Abdominal:     General: Bowel sounds are normal.     Palpations: Abdomen is soft. There is no mass.     Tenderness: There is no abdominal tenderness. There is no guarding or rebound.  Musculoskeletal:        General: Normal range of motion.     Cervical back: Normal range of motion and neck supple.  Lymphadenopathy:     Cervical: No cervical adenopathy.  Skin:    General: Skin is warm and dry.  Neurological:     Mental Status: She is alert.     Deep Tendon Reflexes: Reflexes are normal and symmetric.     Wt Readings from Last 3 Encounters:  01/09/23 225 lb 12.8 oz (102.4 kg)  07/29/22 241 lb (109.3 kg)  05/30/22 235 lb (106.6 kg)    BP 122/74   Pulse 91   Ht 5\' 8"  (1.727 m)   Wt 225 lb 12.8 oz (102.4 kg)   SpO2 96%   BMI 34.33 kg/m   Assessment and Plan: 1. Epigastric pain New onset.  Episodic.  Currently decreasing and having mild breakthrough which is in the epigastric area and radiates.  Patient has not been taking her Imodium I have instructed her to do so and that this will be both diagnostic and therapeutic when she sees Dr. Daleen Squibb later.  In the meantime we will repeat her hepatic function panel and lipase in which I did a calculation of her fib 4 was at 2.38 which puts her into the moderate range.  Review of the CT scan however does not note any echogenicity that was suggest fatty liver disease and this is encouraging as well she  will continue her pantoprazole given her history of gastritis.  And will follow up with Dr. Daleen Squibb as previously mentioned. - Hepatic function panel - Lipase  2. Elevated liver enzymes As noted elevated liver enzymes with A-fib for value of 2.38.  This is in the moderate range and we will continue to control her lipid management with lovastatin 40 mg daily. - Hepatic function panel - Platelet count - Lipase  3. Other acute gastritis without hemorrhage Patient does have a history of gastritis which she is currently taking pantoprazole 40 mg once a day.  This is also a problem will be addressed by Dr. Daleen Squibb on her recheck to see if there is a possibility of any upper GI concerns such as dyspepsia possibility of gastric ulcer which may have contributed to the patient's symptoms and may need further evaluation.    Elizabeth Sauer, MD

## 2023-01-10 ENCOUNTER — Encounter: Payer: Self-pay | Admitting: Family Medicine

## 2023-01-10 LAB — HEPATIC FUNCTION PANEL
ALT: 46 [IU]/L — ABNORMAL HIGH (ref 0–32)
AST: 16 [IU]/L (ref 0–40)
Albumin: 4.5 g/dL (ref 3.8–4.9)
Alkaline Phosphatase: 149 [IU]/L — ABNORMAL HIGH (ref 44–121)
Bilirubin Total: <0.2 mg/dL (ref 0.0–1.2)
Bilirubin, Direct: <0.1 mg/dL (ref 0.00–0.40)
Total Protein: 7 g/dL (ref 6.0–8.5)

## 2023-01-10 LAB — LIPASE: Lipase: 56 U/L (ref 14–72)

## 2023-01-10 LAB — PLATELET COUNT: Platelets: 286 10*3/uL (ref 150–450)

## 2023-01-16 ENCOUNTER — Encounter: Payer: Self-pay | Admitting: Gastroenterology

## 2023-01-16 ENCOUNTER — Ambulatory Visit: Payer: BC Managed Care – PPO | Admitting: Gastroenterology

## 2023-01-16 VITALS — BP 114/77 | HR 91 | Temp 98.1°F | Wt 225.0 lb

## 2023-01-16 DIAGNOSIS — Z09 Encounter for follow-up examination after completed treatment for conditions other than malignant neoplasm: Secondary | ICD-10-CM

## 2023-01-16 DIAGNOSIS — Z8719 Personal history of other diseases of the digestive system: Secondary | ICD-10-CM

## 2023-01-16 DIAGNOSIS — K580 Irritable bowel syndrome with diarrhea: Secondary | ICD-10-CM

## 2023-01-16 NOTE — Progress Notes (Signed)
Primary Care Physician: Duanne Limerick, MD  Primary Gastroenterologist:  Dr. Midge Minium  Chief Complaint  Patient presents with   Hospitalization Follow-up    HPI: Betty Walters is a 62 y.o. female here after being seen in the emergency department for abdominal pain.  The patient states she was in the emergency department and had a CT scan of the abdomen and pelvis that showed:   IMPRESSION: No acute abnormality in the abdomen or pelvis.  The patient also had her lipase checked in the emergency department which was 57 and the repeat was 56 a week ago.  The patient also was noted to have abnormal liver enzymes with her liver enzymes in April being normal and then that increased in the beginning of October but a repeat showed that they had started to trend down.  Component     Latest Ref Rng 01/09/2023  Lipase     14 - 72 U/L 56     Component     Latest Ref Rng 07/29/2022 01/04/2023 01/09/2023  Total Bilirubin     0.0 - 1.2 mg/dL <1.6  0.7  <1.0   BILIRUBIN, DIRECT     0.00 - 0.40 mg/dL   <9.60   Alkaline Phosphatase     44 - 121 IU/L 93  152 (H)  149 (H)   AST     0 - 40 IU/L 14  81 (H)  16   ALT     0 - 32 IU/L 14  48 (H)  46 (H)    The patient has a long history of irritable bowel syndrome and reported that this felt different than her irritable bowel syndrome.  The patient was told that she had gastritis and to follow-up with me as an outpatient.  Past Medical History:  Diagnosis Date   Concussion May 2016   Depression    Diabetes mellitus without complication (HCC)    Family history of adverse reaction to anesthesia    Father - slow to wake   GERD (gastroesophageal reflux disease)    High cholesterol    Hypertension    Yeast infection     Current Outpatient Medications  Medication Sig Dispense Refill   celecoxib (CELEBREX) 100 MG capsule Take 1 capsule (100 mg total) by mouth 2 (two) times daily. 120 capsule 1   cyclobenzaprine (FLEXERIL) 5 MG tablet  Take 1 tablet (5 mg total) by mouth at bedtime as needed. 30 tablet 5   dicyclomine (BENTYL) 20 MG tablet Take 1 tablet (20 mg total) by mouth 3 (three) times daily before meals. 270 tablet 3   gabapentin (NEURONTIN) 300 MG capsule Take 1 capsule by mouth at bedtime 90 capsule 0   HYDROcodone-acetaminophen (NORCO/VICODIN) 5-325 MG tablet Take 2 tablets by mouth every 8 (eight) hours as needed. 14 tablet 0   Insulin Pen Needle (PEN NEEDLES 3/16") 31G X 5 MM MISC 1 each by Does not apply route daily. 100 each 0   liraglutide (VICTOZA) 18 MG/3ML SOPN INJECT 1.8MG  INTO THE SKIN ONCE A DAY 9 mL 1   lisinopril (ZESTRIL) 20 MG tablet Take 1 tablet (20 mg total) by mouth daily. 90 tablet 1   lovastatin (MEVACOR) 40 MG tablet Take 1 tablet by mouth once daily 90 tablet 1   metFORMIN (GLUCOPHAGE) 1000 MG tablet Take 1 tablet (1,000 mg total) by mouth 2 (two) times daily. 180 tablet 1   montelukast (SINGULAIR) 10 MG tablet Take 1 tablet (10 mg total)  by mouth at bedtime. 30 tablet 3   nystatin cream (MYCOSTATIN) Apply 1 Application topically 2 (two) times daily. 30 g 2   ondansetron (ZOFRAN-ODT) 4 MG disintegrating tablet Take 1 tablet (4 mg total) by mouth every 6 (six) hours as needed for nausea or vomiting. 20 tablet 0   pantoprazole (PROTONIX) 40 MG tablet Take 1 tablet (40 mg total) by mouth daily. 90 tablet 3   pioglitazone (ACTOS) 15 MG tablet Take 1 tablet daily 90 tablet 1   sertraline (ZOLOFT) 100 MG tablet TAKE 1 TABLET BY MOUTH ONCE DAILY (ALONG  WITH  50MG   TAB) 90 tablet 1   sertraline (ZOLOFT) 50 MG tablet TAKE 1 TABLET BY MOUTH ONCE DAILY (ALONG  WITH  100MG   TABS)  - 90 tablet 1   fluticasone (FLONASE) 50 MCG/ACT nasal spray Place 2 sprays into both nostrils daily. 16 g 0   No current facility-administered medications for this visit.    Allergies as of 01/16/2023 - Review Complete 01/16/2023  Allergen Reaction Noted   Amoxicillin-pot clavulanate Nausea And Vomiting 01/26/2014     ROS:  General: Negative for anorexia, weight loss, fever, chills, fatigue, weakness. ENT: Negative for hoarseness, difficulty swallowing , nasal congestion. CV: Negative for chest pain, angina, palpitations, dyspnea on exertion, peripheral edema.  Respiratory: Negative for dyspnea at rest, dyspnea on exertion, cough, sputum, wheezing.  GI: See history of present illness. GU:  Negative for dysuria, hematuria, urinary incontinence, urinary frequency, nocturnal urination.  Endo: Negative for unusual weight change.    Physical Examination:   BP 114/77 (BP Location: Left Arm, Patient Position: Sitting, Cuff Size: Large)   Pulse 91   Temp 98.1 F (36.7 C) (Oral)   Wt 225 lb (102.1 kg)   BMI 34.21 kg/m   General: Well-nourished, well-developed in no acute distress.  Eyes: No icterus. Conjunctivae pink. Neuro: Alert and oriented x 3.  Grossly intact. Skin: Warm and dry, no jaundice.   Psych: Alert and cooperative, normal mood and affect.  Labs:    Imaging Studies: CT ABDOMEN PELVIS W CONTRAST  Result Date: 01/04/2023 CLINICAL DATA:  Epigastric pain that started around 5 p.m. today radiating to the back. Diarrhea yesterday. Vomiting today. EXAM: CT ABDOMEN AND PELVIS WITH CONTRAST TECHNIQUE: Multidetector CT imaging of the abdomen and pelvis was performed using the standard protocol following bolus administration of intravenous contrast. RADIATION DOSE REDUCTION: This exam was performed according to the departmental dose-optimization program which includes automated exposure control, adjustment of the mA and/or kV according to patient size and/or use of iterative reconstruction technique. CONTRAST:  OMNIPAQUE IOHEXOL 350 MG/ML SOLN COMPARISON:  CT 12/10/2021 FINDINGS: Lower chest: No acute abnormality. Hepatobiliary: Unremarkable liver. Cholecystectomy. No biliary dilation. Pancreas: Unremarkable. Spleen: Unremarkable. Adrenals/Urinary Tract: Normal adrenal glands. No urinary  calculi or hydronephrosis. Bladder is unremarkable. Stomach/Bowel: Normal caliber large and small bowel. No bowel wall thickening. The appendix is not visualized. No secondary signs of appendicitis.Stomach is within normal limits. Vascular/Lymphatic: No significant vascular findings are present. No enlarged abdominal or pelvic lymph nodes. Reproductive: Unremarkable. Other: No free intraperitoneal fluid or air. Musculoskeletal: No acute fracture. IMPRESSION: No acute abnormality in the abdomen or pelvis. Electronically Signed   By: Minerva Fester M.D.   On: 01/04/2023 23:49   DG Chest 2 View  Result Date: 01/04/2023 CLINICAL DATA:  chest pain EXAM: CHEST - 2 VIEW COMPARISON:  Chest x-ray 01/30/2021 FINDINGS: The heart and mediastinal contours are within normal limits. No focal consolidation. No pulmonary edema.  No pleural effusion. No pneumothorax. No acute osseous abnormality. IMPRESSION: No active cardiopulmonary disease. Electronically Signed   By: Tish Frederickson M.D.   On: 01/04/2023 21:26    Assessment and Plan:   Betty Walters is a 62 y.o. y/o female who comes in today with a history of irritable bowel syndrome which is being controlled with dicyclomine and her Protonix who was recently in the ED with some abdominal discomfort and abnormal labs.  The patient is back to her baseline at the present time.  She has no complaints today.  The patient has been told that she may have had a viral or bacterial illness.  The patient will follow-up as needed.  The patient has been explained the plan and agrees with it.     Midge Minium, MD. Clementeen Graham    Note: This dictation was prepared with Dragon dictation along with smaller phrase technology. Any transcriptional errors that result from this process are unintentional.

## 2023-02-04 ENCOUNTER — Telehealth: Payer: BC Managed Care – PPO | Admitting: Physician Assistant

## 2023-02-04 DIAGNOSIS — J019 Acute sinusitis, unspecified: Secondary | ICD-10-CM | POA: Diagnosis not present

## 2023-02-04 DIAGNOSIS — T3695XA Adverse effect of unspecified systemic antibiotic, initial encounter: Secondary | ICD-10-CM | POA: Diagnosis not present

## 2023-02-04 DIAGNOSIS — B379 Candidiasis, unspecified: Secondary | ICD-10-CM | POA: Diagnosis not present

## 2023-02-04 DIAGNOSIS — B9689 Other specified bacterial agents as the cause of diseases classified elsewhere: Secondary | ICD-10-CM

## 2023-02-04 MED ORDER — BENZONATATE 100 MG PO CAPS
100.0000 mg | ORAL_CAPSULE | Freq: Three times a day (TID) | ORAL | 0 refills | Status: DC | PRN
Start: 2023-02-04 — End: 2023-07-31

## 2023-02-04 MED ORDER — DOXYCYCLINE HYCLATE 100 MG PO TABS
100.0000 mg | ORAL_TABLET | Freq: Two times a day (BID) | ORAL | 0 refills | Status: DC
Start: 2023-02-04 — End: 2023-07-31

## 2023-02-04 MED ORDER — FLUCONAZOLE 150 MG PO TABS
150.0000 mg | ORAL_TABLET | Freq: Once | ORAL | 0 refills | Status: AC
Start: 2023-02-04 — End: 2023-02-04

## 2023-02-04 NOTE — Patient Instructions (Signed)
Betty Walters, thank you for joining [name], PA-C for today's virtual visit.  While this provider is not your primary care provider (PCP), if your PCP is located in our provider database this encounter information will be shared with them immediately following your visit.   A [facility] MyChart account gives you access to today's visit and all your visits, tests, and labs performed at [facility] " click here if you don't have a [place] [facility] MyChart account or go to [email or web address]  Consent: (Patient) Betty Walters provided verbal consent for this virtual visit at the beginning of the encounter.  Current Medications:  Current Outpatient Medications:    benzonatate (TESSALON) 100 MG capsule, Take 1-2 capsules (100-200 mg total) by mouth 3 (three) times daily as needed., Disp: 30 capsule, Rfl: 0   doxycycline (VIBRA-TABS) 100 MG tablet, Take 1 tablet (100 mg total) by mouth 2 (two) times daily., Disp: 20 tablet, Rfl: 0   fluconazole (DIFLUCAN) 150 MG tablet, Take 1 tablet (150 mg total) by mouth once for 1 dose., Disp: 1 tablet, Rfl: 0   celecoxib (CELEBREX) 100 MG capsule, Take 1 capsule (100 mg total) by mouth 2 (two) times daily., Disp: 120 capsule, Rfl: 1   cyclobenzaprine (FLEXERIL) 5 MG tablet, Take 1 tablet (5 mg total) by mouth at bedtime as needed., Disp: 30 tablet, Rfl: 5   dicyclomine (BENTYL) 20 MG tablet, Take 1 tablet (20 mg total) by mouth 3 (three) times daily before meals., Disp: 270 tablet, Rfl: 3   fluticasone (FLONASE) 50 MCG/ACT nasal spray, Place 2 sprays into both nostrils daily., Disp: 16 g, Rfl: 0   gabapentin (NEURONTIN) 300 MG capsule, Take 1 capsule by mouth at bedtime, Disp: 90 capsule, Rfl: 0   HYDROcodone-acetaminophen (NORCO/VICODIN) 5-325 MG tablet, Take 2 tablets by mouth every 8 (eight) hours as needed., Disp: 14 tablet, Rfl: 0   Insulin Pen Needle (PEN NEEDLES 3/16") 31G X 5 MM MISC, 1 each by Does not apply route daily.,  Disp: 100 each, Rfl: 0   liraglutide (VICTOZA) 18 MG/3ML SOPN, INJECT 1.8MG  INTO THE SKIN ONCE A DAY, Disp: 9 mL, Rfl: 1   lisinopril (ZESTRIL) 20 MG tablet, Take 1 tablet (20 mg total) by mouth daily., Disp: 90 tablet, Rfl: 1   lovastatin (MEVACOR) 40 MG tablet, Take 1 tablet by mouth once daily, Disp: 90 tablet, Rfl: 1   metFORMIN (GLUCOPHAGE) 1000 MG tablet, Take 1 tablet (1,000 mg total) by mouth 2 (two) times daily., Disp: 180 tablet, Rfl: 1   montelukast (SINGULAIR) 10 MG tablet, Take 1 tablet (10 mg total) by mouth at bedtime., Disp: 30 tablet, Rfl: 3   nystatin cream (MYCOSTATIN), Apply 1 Application topically 2 (two) times daily., Disp: 30 g, Rfl: 2   ondansetron (ZOFRAN-ODT) 4 MG disintegrating tablet, Take 1 tablet (4 mg total) by mouth every 6 (six) hours as needed for nausea or vomiting., Disp: 20 tablet, Rfl: 0   pantoprazole (PROTONIX) 40 MG tablet, Take 1 tablet (40 mg total) by mouth daily., Disp: 90 tablet, Rfl: 3   pioglitazone (ACTOS) 15 MG tablet, Take 1 tablet daily, Disp: 90 tablet, Rfl: 1   sertraline (ZOLOFT) 100 MG tablet, TAKE 1 TABLET BY MOUTH ONCE DAILY (ALONG  WITH  50MG   TAB), Disp: 90 tablet, Rfl: 1   sertraline (ZOLOFT) 50 MG tablet, TAKE 1 TABLET BY MOUTH ONCE DAILY (ALONG  WITH  100MG   TABS)  -, Disp: 90 tablet, Rfl: 1   Medications  ordered in this encounter:  Meds ordered this encounter  Medications   doxycycline (VIBRA-TABS) 100 MG tablet    Sig: Take 1 tablet (100 mg total) by mouth 2 (two) times daily.    Dispense:  20 tablet    Refill:  0    Order Specific Question:   Supervising Provider    Answer:   Merrilee Jansky [[id number]]   benzonatate (TESSALON) 100 MG capsule    Sig: Take 1-2 capsules (100-200 mg total) by mouth 3 (three) times daily as needed.    Dispense:  30 capsule    Refill:  0    Order Specific Question:   Supervising Provider    Answer:   Merrilee Jansky [[id number]]   fluconazole (DIFLUCAN) 150 MG tablet    Sig: Take 1 tablet (150  mg total) by mouth once for 1 dose.    Dispense:  1 tablet    Refill:  0    Order Specific Question:   Supervising Provider    Answer:   Merrilee Jansky [id number]     *If you need refills on other medications prior to your next appointment, please contact your pharmacy*  Follow-Up: Call back or seek an in-person evaluation if the symptoms worsen or if the condition fails to improve as anticipated.  [facility] [phone]  Other Instructions Sinus Infection, Adult A sinus infection, also called sinusitis, is inflammation of your sinuses. Sinuses are hollow spaces in the bones around your face. Your sinuses are located: Around your eyes. In the middle of your forehead. Behind your nose. In your cheekbones. Mucus normally drains out of your sinuses. When your nasal tissues become inflamed or swollen, mucus can become trapped or blocked. This allows bacteria, viruses, and fungi to grow, which leads to infection. Most infections of the sinuses are caused by a virus. A sinus infection can develop quickly. It can last for up to 4 weeks (acute) or for more than 12 weeks (chronic). A sinus infection often develops after a cold. What are the causes? This condition is caused by anything that creates swelling in the sinuses or stops mucus from draining. This includes: Allergies. Asthma. Infection from bacteria or viruses. Deformities or blockages in your nose or sinuses. Abnormal growths in the nose (nasal polyps). Pollutants, such as chemicals or irritants in the air. Infection from fungi. This is rare. What increases the risk? You are more likely to develop this condition if you: Have a weak body defense system (immune system). Do a lot of swimming or diving. Overuse nasal sprays. Smoke. What are the signs or symptoms? The main symptoms of this condition are pain and a feeling of pressure around the affected sinuses. Other symptoms include: Stuffy nose or congestion  that makes it difficult to breathe through your nose. Thick yellow or greenish drainage from your nose. Tenderness, swelling, and warmth over the affected sinuses. A cough that may get worse at night. Decreased sense of smell and taste. Extra mucus that collects in the throat or the back of the nose (postnasal drip) causing a sore throat or bad breath. Tiredness (fatigue). Fever. How is this diagnosed? This condition is diagnosed based on: Your symptoms. Your medical history. A physical exam. Tests to find out if your condition is acute or chronic. This may include: Checking your nose for nasal polyps. Viewing your sinuses using a device that has a light (endoscope). Testing for allergies or bacteria. Imaging tests, such as an MRI or CT  scan. In rare cases, a bone biopsy may be done to rule out more serious types of fungal sinus disease. How is this treated? Treatment for a sinus infection depends on the cause and whether your condition is chronic or acute. If caused by a virus, your symptoms should go away on their own within 10 days. You may be given medicines to relieve symptoms. They include: Medicines that shrink swollen nasal passages (decongestants). A spray that eases inflammation of the nostrils (topical intranasal corticosteroids). Rinses that help get rid of thick mucus in your nose (nasal saline washes). Medicines that treat allergies (antihistamines). Over-the-counter pain relievers. If caused by bacteria, your health care provider may recommend waiting to see if your symptoms improve. Most bacterial infections will get better without antibiotic medicine. You may be given antibiotics if you have: A severe infection. A weak immune system. If caused by narrow nasal passages or nasal polyps, surgery may be needed. Follow these instructions at home: Medicines Take, use, or apply over-the-counter and prescription medicines only as told by your health care provider. These may  include nasal sprays. If you were prescribed an antibiotic medicine, take it as told by your health care provider. Do not stop taking the antibiotic even if you start to feel better. Hydrate and humidify  Drink enough fluid to keep your urine pale yellow. Staying hydrated will help to thin your mucus. Use a cool mist humidifier to keep the humidity level in your home above 50%. Inhale steam for 10-15 minutes, 3-4 times a day, or as told by your health care provider. You can do this in the bathroom while a hot shower is running. Limit your exposure to cool or dry air. Rest Rest as much as possible. Sleep with your head raised (elevated). Make sure you get enough sleep each night. General instructions  Apply a warm, moist washcloth to your face 3-4 times a day or as told by your health care provider. This will help with discomfort. Use nasal saline washes as often as told by your health care provider. Wash your hands often with soap and water to reduce your exposure to germs. If soap and water are not available, use hand sanitizer. Do not smoke. Avoid being around people who are smoking (secondhand smoke). Keep all follow-up visits. This is important. Contact a health care provider if: You have a fever. Your symptoms get worse. Your symptoms do not improve within 10 days. Get help right away if: You have a severe headache. You have persistent vomiting. You have severe pain or swelling around your face or eyes. You have vision problems. You develop confusion. Your neck is stiff. You have trouble breathing. These symptoms may be an emergency. Get help right away. Call 911. Do not wait to see if the symptoms will go away. Do not drive yourself to the hospital. Summary A sinus infection is soreness and inflammation of your sinuses. Sinuses are hollow spaces in the bones around your face. This condition is caused by nasal tissues that become inflamed or swollen. The swelling traps or  blocks the flow of mucus. This allows bacteria, viruses, and fungi to grow, which leads to infection. If you were prescribed an antibiotic medicine, take it as told by your health care provider. Do not stop taking the antibiotic even if you start to feel better. Keep all follow-up visits. This is important. This information is not intended to replace advice given to you by your health care provider. Make sure you discuss any  questions you have with your health care provider. Document Revised: 02/20/2021 Document Reviewed: 02/20/2021 Elsevier Patient Education  2024 Elsevier Inc.    If you have been instructed to have an in-person evaluation today at a local Urgent Care facility, please use the link below. It will take you to a list of all of our available [facility], including address, phone number and hours of operation. Please do not delay care.  [facility]  If you or a family member do not have a primary care provider, use the link below to schedule a visit and establish care. When you choose a [facility] primary care physician or advanced practice provider, you gain a long-term partner in health. Find a Primary Care Provider  Learn more about [facility]'s in-office and virtual care options:  - Get Care Now

## 2023-02-04 NOTE — Progress Notes (Signed)
Virtual Visit Consent   ENVI EAGLESON, you are scheduled for a virtual visit with a [facility] provider today. Just as with appointments in the office, your consent must be obtained to participate. Your consent will be active for this visit and any virtual visit you may have with one of our providers in the next 365 days. If you have a MyChart account, a copy of this consent can be sent to you electronically.  As this is a virtual visit, video technology does not allow for your provider to perform a traditional examination. This may limit your provider's ability to fully assess your condition. If your provider identifies any concerns that need to be evaluated in person or the need to arrange testing (such as labs, EKG, etc.), we will make arrangements to do so. Although advances in technology are sophisticated, we cannot ensure that it will always work on either your end or our end. If the connection with a video visit is poor, the visit may have to be switched to a telephone visit. With either a video or telephone visit, we are not always able to ensure that we have a secure connection.  By engaging in this virtual visit, you consent to the provision of healthcare and authorize for your insurance to be billed (if applicable) for the services provided during this visit. Depending on your insurance coverage, you may receive a charge related to this service.  I need to obtain your verbal consent now. Are you willing to proceed with your visit today? Betty Walters has provided verbal consent on 02/04/2023 for a virtual visit (video or telephone). Betty Loveless, PA-C  Date: 02/04/2023 1:50 PM  Virtual Visit via Video Note   IMargaretann Walters, connected with  Betty Walters  ([id number], [birth year]) on 02/04/23 at  1:45 PM EST by a video-enabled telemedicine application and verified that I am speaking with the correct person using two identifiers.  Location: Patient: Virtual Visit Location  Patient: Home Provider: Virtual Visit Location Provider: Home Office   I discussed the limitations of evaluation and management by telemedicine and the availability of in person appointments. The patient expressed understanding and agreed to proceed.    History of Present Illness: Betty Walters is a 62 y.o. who identifies as a female who was assigned female at birth, and is being seen today for possible sinus infection.  HPI: Sinus Problem This is a recurrent problem. The current episode started in the past 7 days (last infection was Sept 2024 and treated with Doxycycline). The problem has been gradually worsening since onset. There has been no fever. Associated symptoms include congestion, coughing, ear pain (bilateral and fullness), headaches, sinus pressure and a sore throat (from drainage). Pertinent negatives include no chills or hoarse voice. (Post nasal drainage) Treatments tried: vicks cold and cough, alka seltzer cold and cough. The treatment provided no relief.  Negative at home Covid 19 testing    Problems:  Patient Active Problem List   Diagnosis Date Noted   Acute pyelonephritis 12/10/2021   AKI (acute kidney injury) (HCC) 12/10/2021   Type 2 diabetes mellitus with peripheral neuropathy (HCC) 12/10/2021   Dyslipidemia 12/10/2021   Depression 12/10/2021   GERD without esophagitis 12/10/2021   Sprain, metatarsophalangeal joint 01/26/2018   Heartburn    Dyspepsia    Change in bowel habits    Benign neoplasm of descending colon    Chronic pain of toes of both feet 09/20/2016   Type 2 diabetes  mellitus with complication (HCC) 09/06/2015   Allergic rhinitis due to pollen 06/21/2015   Concussion with coma 10/21/2014   Clinical depression 09/28/2014   Diabetes mellitus, type 2 (HCC) 09/28/2014   HLD (hyperlipidemia) 09/28/2014   Essential hypertension 09/28/2014   Adiposity 09/28/2014    Allergies:  Allergies  Allergen Reactions   Amoxicillin-Pot Clavulanate Nausea And  Vomiting    (OK with Amoxicillin alone)   Medications:  Current Outpatient Medications:    benzonatate (TESSALON) 100 MG capsule, Take 1-2 capsules (100-200 mg total) by mouth 3 (three) times daily as needed., Disp: 30 capsule, Rfl: 0   doxycycline (VIBRA-TABS) 100 MG tablet, Take 1 tablet (100 mg total) by mouth 2 (two) times daily., Disp: 20 tablet, Rfl: 0   fluconazole (DIFLUCAN) 150 MG tablet, Take 1 tablet (150 mg total) by mouth once for 1 dose., Disp: 1 tablet, Rfl: 0   celecoxib (CELEBREX) 100 MG capsule, Take 1 capsule (100 mg total) by mouth 2 (two) times daily., Disp: 120 capsule, Rfl: 1   cyclobenzaprine (FLEXERIL) 5 MG tablet, Take 1 tablet (5 mg total) by mouth at bedtime as needed., Disp: 30 tablet, Rfl: 5   dicyclomine (BENTYL) 20 MG tablet, Take 1 tablet (20 mg total) by mouth 3 (three) times daily before meals., Disp: 270 tablet, Rfl: 3   fluticasone (FLONASE) 50 MCG/ACT nasal spray, Place 2 sprays into both nostrils daily., Disp: 16 g, Rfl: 0   gabapentin (NEURONTIN) 300 MG capsule, Take 1 capsule by mouth at bedtime, Disp: 90 capsule, Rfl: 0   HYDROcodone-acetaminophen (NORCO/VICODIN) 5-325 MG tablet, Take 2 tablets by mouth every 8 (eight) hours as needed., Disp: 14 tablet, Rfl: 0   Insulin Pen Needle (PEN NEEDLES 3/16") 31G X 5 MM MISC, 1 each by Does not apply route daily., Disp: 100 each, Rfl: 0   liraglutide (VICTOZA) 18 MG/3ML SOPN, INJECT 1.8MG  INTO THE SKIN ONCE A DAY, Disp: 9 mL, Rfl: 1   lisinopril (ZESTRIL) 20 MG tablet, Take 1 tablet (20 mg total) by mouth daily., Disp: 90 tablet, Rfl: 1   lovastatin (MEVACOR) 40 MG tablet, Take 1 tablet by mouth once daily, Disp: 90 tablet, Rfl: 1   metFORMIN (GLUCOPHAGE) 1000 MG tablet, Take 1 tablet (1,000 mg total) by mouth 2 (two) times daily., Disp: 180 tablet, Rfl: 1   montelukast (SINGULAIR) 10 MG tablet, Take 1 tablet (10 mg total) by mouth at bedtime., Disp: 30 tablet, Rfl: 3   nystatin cream (MYCOSTATIN), Apply 1  Application topically 2 (two) times daily., Disp: 30 g, Rfl: 2   ondansetron (ZOFRAN-ODT) 4 MG disintegrating tablet, Take 1 tablet (4 mg total) by mouth every 6 (six) hours as needed for nausea or vomiting., Disp: 20 tablet, Rfl: 0   pantoprazole (PROTONIX) 40 MG tablet, Take 1 tablet (40 mg total) by mouth daily., Disp: 90 tablet, Rfl: 3   pioglitazone (ACTOS) 15 MG tablet, Take 1 tablet daily, Disp: 90 tablet, Rfl: 1   sertraline (ZOLOFT) 100 MG tablet, TAKE 1 TABLET BY MOUTH ONCE DAILY (ALONG  WITH  50MG   TAB), Disp: 90 tablet, Rfl: 1   sertraline (ZOLOFT) 50 MG tablet, TAKE 1 TABLET BY MOUTH ONCE DAILY (ALONG  WITH  100MG   TABS)  -, Disp: 90 tablet, Rfl: 1   Observations/Objective: Patient is well-developed, well-nourished in no acute distress.  Resting comfortably at home.  Head is normocephalic, atraumatic.  No labored breathing.  Speech is clear and coherent with logical content.  Patient is alert and  oriented at baseline.    Assessment and Plan: 1. Acute bacterial sinusitis - doxycycline (VIBRA-TABS) 100 MG tablet; Take 1 tablet (100 mg total) by mouth 2 (two) times daily.  Dispense: 20 tablet; Refill: 0 - benzonatate (TESSALON) 100 MG capsule; Take 1-2 capsules (100-200 mg total) by mouth 3 (three) times daily as needed.  Dispense: 30 capsule; Refill: 0  2. Antibiotic-induced yeast infection - fluconazole (DIFLUCAN) 150 MG tablet; Take 1 tablet (150 mg total) by mouth once for 1 dose.  Dispense: 1 tablet; Refill: 0  - Worsening symptoms that have not responded to OTC medications.  - Will give Doxycycline - Tessalon for cough - Diflucan given as prophylaxis as patient occasionally will get vaginal yeast infections with antibiotic use - Continue allergy medications.  - Steam and humidifier can help - Stay well hydrated and get plenty of rest.  - Seek in person evaluation if no symptom improvement or if symptoms worsen   Follow Up Instructions: I discussed the assessment  and treatment plan with the patient. The patient was provided an opportunity to ask questions and all were answered. The patient agreed with the plan and demonstrated an understanding of the instructions.  A copy of instructions were sent to the patient via MyChart unless otherwise noted below.    The patient was advised to call back or seek an in-person evaluation if the symptoms worsen or if the condition fails to improve as anticipated.    Betty Loveless, PA-C

## 2023-03-10 ENCOUNTER — Other Ambulatory Visit: Payer: Self-pay | Admitting: Family Medicine

## 2023-03-10 DIAGNOSIS — E119 Type 2 diabetes mellitus without complications: Secondary | ICD-10-CM

## 2023-03-10 DIAGNOSIS — F3341 Major depressive disorder, recurrent, in partial remission: Secondary | ICD-10-CM

## 2023-04-14 ENCOUNTER — Other Ambulatory Visit: Payer: Self-pay | Admitting: Family Medicine

## 2023-04-14 DIAGNOSIS — F3341 Major depressive disorder, recurrent, in partial remission: Secondary | ICD-10-CM

## 2023-04-14 DIAGNOSIS — E119 Type 2 diabetes mellitus without complications: Secondary | ICD-10-CM

## 2023-05-11 ENCOUNTER — Other Ambulatory Visit: Payer: Self-pay | Admitting: Family Medicine

## 2023-05-11 ENCOUNTER — Other Ambulatory Visit: Payer: Self-pay | Admitting: Podiatry

## 2023-05-11 DIAGNOSIS — I10 Essential (primary) hypertension: Secondary | ICD-10-CM

## 2023-05-12 NOTE — Telephone Encounter (Signed)
 Patient will need an office visit for additional refills. Requested Prescriptions  Pending Prescriptions Disp Refills   lisinopril  (ZESTRIL ) 20 MG tablet [Pharmacy Med Name: Lisinopril  20 MG Oral Tablet] 90 tablet 0    Sig: Take 1 tablet by mouth once daily     Cardiovascular:  ACE Inhibitors Passed - 05/12/2023  5:03 PM      Passed - Cr in normal range and within 180 days    Creatinine, Ser  Date Value Ref Range Status  01/04/2023 0.88 0.44 - 1.00 mg/dL Final         Passed - K in normal range and within 180 days    Potassium  Date Value Ref Range Status  01/04/2023 3.8 3.5 - 5.1 mmol/L Final         Passed - Patient is not pregnant      Passed - Last BP in normal range    BP Readings from Last 1 Encounters:  01/16/23 114/77         Passed - Valid encounter within last 6 months    Recent Outpatient Visits           4 months ago Epigastric pain   [facility]   9 months ago Type 2 diabetes mellitus without complication, without long-term current use of insulin  (HCC)   [facility] Part, MD   11 months ago Candidiasis   [facility], MD   1 year ago Type 2 diabetes mellitus without complication, without long-term current use of insulin  [facility])   [facility], [facility]   1 year ago Pyelonephritis   [facility], MD

## 2023-05-16 ENCOUNTER — Telehealth: Payer: 59 | Admitting: Family Medicine

## 2023-05-16 DIAGNOSIS — J40 Bronchitis, not specified as acute or chronic: Secondary | ICD-10-CM

## 2023-05-16 MED ORDER — AZITHROMYCIN 250 MG PO TABS: ORAL_TABLET | ORAL | 0 refills | Status: AC

## 2023-05-16 NOTE — Progress Notes (Signed)
Virtual Visit Consent   Betty Walters, you are scheduled for a virtual visit with a  provider today. Just as with appointments in the office, your consent must be obtained to participate. Your consent will be active for this visit and any virtual visit you may have with one of our providers in the next 365 days. If you have a MyChart account, a copy of this consent can be sent to you electronically.  As this is a virtual visit, video technology does not allow for your provider to perform a traditional examination. This may limit your provider's ability to fully assess your condition. If your provider identifies any concerns that need to be evaluated in person or the need to arrange testing (such as labs, EKG, etc.), we will make arrangements to do so. Although advances in technology are sophisticated, we cannot ensure that it will always work on either your end or our end. If the connection with a video visit is poor, the visit may have to be switched to a telephone visit. With either a video or telephone visit, we are not always able to ensure that we have a secure connection.  By engaging in this virtual visit, you consent to the provision of healthcare and authorize for your insurance to be billed (if applicable) for the services provided during this visit. Depending on your insurance coverage, you may receive a charge related to this service.  I need to obtain your verbal consent now. Are you willing to proceed with your visit today? KAYTIE RATCLIFFE has provided verbal consent on 05/16/2023 for a virtual visit (video or telephone). Georgana Curio, FNP  Date: 05/16/2023 3:27 PM   Virtual Visit via Video Note   I, Georgana Curio, connected with  Betty Walters  ([id number], 11-06-1960) on 05/16/23 at  3:30 PM EST by a video-enabled telemedicine application and verified that I am speaking with the correct person using two identifiers.  Location: Patient: Virtual Visit Location Patient:  Home Provider: Virtual Visit Location Provider: Home Office   I discussed the limitations of evaluation and management by telemedicine and the availability of in person appointments. The patient expressed understanding and agreed to proceed.    History of Present Illness: Betty Walters is a 63 y.o. who identifies as a female who was assigned female at birth, and is being seen today for persistent cough, head congestion and headache since having covid 2 weeks ago. No fever. Marland Kitchen  HPI: HPI  Problems:  Patient Active Problem List   Diagnosis Date Noted   Acute pyelonephritis 12/10/2021   AKI (acute kidney injury) (HCC) 12/10/2021   Type 2 diabetes mellitus with peripheral neuropathy (HCC) 12/10/2021   Dyslipidemia 12/10/2021   Depression 12/10/2021   GERD without esophagitis 12/10/2021   Sprain, metatarsophalangeal joint 01/26/2018   Heartburn    Dyspepsia    Change in bowel habits    Benign neoplasm of descending colon    Chronic pain of toes of both feet 09/20/2016   Type 2 diabetes mellitus with complication (HCC) 09/06/2015   Allergic rhinitis due to pollen 06/21/2015   Concussion with coma 10/21/2014   Clinical depression 09/28/2014   Diabetes mellitus, type 2 (HCC) 09/28/2014   HLD (hyperlipidemia) 09/28/2014   Essential hypertension 09/28/2014   Adiposity 09/28/2014    Allergies:  Allergies  Allergen Reactions   Amoxicillin-Pot Clavulanate Nausea And Vomiting    (OK with Amoxicillin alone)   Medications:  Current Outpatient Medications:    azithromycin (ZITHROMAX)  250 MG tablet, Take 2 tablets on day 1, then 1 tablet daily on days 2 through 5, Disp: 6 tablet, Rfl: 0   benzonatate (TESSALON) 100 MG capsule, Take 1-2 capsules (100-200 mg total) by mouth 3 (three) times daily as needed., Disp: 30 capsule, Rfl: 0   celecoxib (CELEBREX) 100 MG capsule, Take 1 capsule (100 mg total) by mouth 2 (two) times daily., Disp: 120 capsule, Rfl: 1   cyclobenzaprine (FLEXERIL) 5 MG  tablet, Take 1 tablet (5 mg total) by mouth at bedtime as needed., Disp: 30 tablet, Rfl: 5   dicyclomine (BENTYL) 20 MG tablet, Take 1 tablet (20 mg total) by mouth 3 (three) times daily before meals., Disp: 270 tablet, Rfl: 3   doxycycline (VIBRA-TABS) 100 MG tablet, Take 1 tablet (100 mg total) by mouth 2 (two) times daily., Disp: 20 tablet, Rfl: 0   fluticasone (FLONASE) 50 MCG/ACT nasal spray, Place 2 sprays into both nostrils daily., Disp: 16 g, Rfl: 0   gabapentin (NEURONTIN) 300 MG capsule, Take 1 capsule by mouth at bedtime, Disp: 90 capsule, Rfl: 0   HYDROcodone-acetaminophen (NORCO/VICODIN) 5-325 MG tablet, Take 2 tablets by mouth every 8 (eight) hours as needed., Disp: 14 tablet, Rfl: 0   Insulin Pen Needle (PEN NEEDLES 3/16") 31G X 5 MM MISC, 1 each by Does not apply route daily., Disp: 100 each, Rfl: 0   liraglutide (VICTOZA) 18 MG/3ML SOPN, INJECT 1.8MG  INTO THE SKIN ONCE A DAY, Disp: 9 mL, Rfl: 1   lisinopril (ZESTRIL) 20 MG tablet, Take 1 tablet by mouth once daily, Disp: 90 tablet, Rfl: 0   lovastatin (MEVACOR) 40 MG tablet, Take 1 tablet by mouth once daily, Disp: 90 tablet, Rfl: 1   metFORMIN (GLUCOPHAGE) 1000 MG tablet, Take 1 tablet by mouth twice daily, Disp: 60 tablet, Rfl: 0   montelukast (SINGULAIR) 10 MG tablet, Take 1 tablet (10 mg total) by mouth at bedtime., Disp: 30 tablet, Rfl: 3   nystatin cream (MYCOSTATIN), Apply 1 Application topically 2 (two) times daily., Disp: 30 g, Rfl: 2   ondansetron (ZOFRAN-ODT) 4 MG disintegrating tablet, Take 1 tablet (4 mg total) by mouth every 6 (six) hours as needed for nausea or vomiting., Disp: 20 tablet, Rfl: 0   pantoprazole (PROTONIX) 40 MG tablet, Take 1 tablet (40 mg total) by mouth daily., Disp: 90 tablet, Rfl: 3   pioglitazone (ACTOS) 15 MG tablet, Take 1 tablet by mouth once daily, Disp: 90 tablet, Rfl: 0   sertraline (ZOLOFT) 100 MG tablet, TAKE 1 TABLET BY MOUTH ONCE DAILY (ALONG WITH 50 MG TABLET), Disp: 30 tablet, Rfl: 0    sertraline (ZOLOFT) 50 MG tablet, TAKE 1 TABLET BY MOUTH ONCE DAILY (ALONG WITH 100 MG TABLET), Disp: 30 tablet, Rfl: 0  Observations/Objective: Patient is well-developed, well-nourished in no acute distress.  Resting comfortably  at home.  Head is normocephalic, atraumatic.  No labored breathing.  Speech is clear and coherent with logical content.  Patient is alert and oriented at baseline.    Assessment and Plan: 1. Bronchitis (Primary)  Increase fluids, humidifier at night, tylenol or ibuprofen as directed UC if sx worsen.   Follow Up Instructions: I discussed the assessment and treatment plan with the patient. The patient was provided an opportunity to ask questions and all were answered. The patient agreed with the plan and demonstrated an understanding of the instructions.  A copy of instructions were sent to the patient via MyChart unless otherwise noted below.  The patient was advised to call back or seek an in-person evaluation if the symptoms worsen or if the condition fails to improve as anticipated.    Georgana Curio, FNP

## 2023-05-16 NOTE — Patient Instructions (Signed)

## 2023-05-30 ENCOUNTER — Other Ambulatory Visit: Payer: Self-pay | Admitting: Family Medicine

## 2023-05-30 DIAGNOSIS — E119 Type 2 diabetes mellitus without complications: Secondary | ICD-10-CM

## 2023-05-30 DIAGNOSIS — F3341 Major depressive disorder, recurrent, in partial remission: Secondary | ICD-10-CM

## 2023-06-30 LAB — PROTEIN / CREATININE RATIO, URINE
Albumin, U: <7
Creatinine, Urine: 183.9

## 2023-06-30 LAB — MICROALBUMIN / CREATININE URINE RATIO: Microalb Creat Ratio: <3.8

## 2023-06-30 LAB — HEMOGLOBIN A1C: Hemoglobin A1C: 6.7

## 2023-07-03 ENCOUNTER — Other Ambulatory Visit: Payer: Self-pay | Admitting: Family Medicine

## 2023-07-03 DIAGNOSIS — F3341 Major depressive disorder, recurrent, in partial remission: Secondary | ICD-10-CM

## 2023-07-03 DIAGNOSIS — E119 Type 2 diabetes mellitus without complications: Secondary | ICD-10-CM

## 2023-07-31 ENCOUNTER — Ambulatory Visit: Admitting: Family Medicine

## 2023-07-31 ENCOUNTER — Encounter: Payer: Self-pay | Admitting: Family Medicine

## 2023-07-31 VITALS — BP 122/66 | HR 85 | Ht 68.0 in | Wt 208.4 lb

## 2023-07-31 DIAGNOSIS — E119 Type 2 diabetes mellitus without complications: Secondary | ICD-10-CM | POA: Diagnosis not present

## 2023-07-31 DIAGNOSIS — I10 Essential (primary) hypertension: Secondary | ICD-10-CM | POA: Diagnosis not present

## 2023-07-31 DIAGNOSIS — E782 Mixed hyperlipidemia: Secondary | ICD-10-CM

## 2023-07-31 DIAGNOSIS — Z7984 Long term (current) use of oral hypoglycemic drugs: Secondary | ICD-10-CM

## 2023-07-31 DIAGNOSIS — F3341 Major depressive disorder, recurrent, in partial remission: Secondary | ICD-10-CM

## 2023-07-31 DIAGNOSIS — J301 Allergic rhinitis due to pollen: Secondary | ICD-10-CM | POA: Diagnosis not present

## 2023-07-31 DIAGNOSIS — Z7985 Long-term (current) use of injectable non-insulin antidiabetic drugs: Secondary | ICD-10-CM

## 2023-07-31 MED ORDER — SERTRALINE HCL 100 MG PO TABS: ORAL_TABLET | ORAL | 1 refills | Status: DC

## 2023-07-31 MED ORDER — MONTELUKAST SODIUM 10 MG PO TABS: 10.0000 mg | ORAL_TABLET | Freq: Every day | ORAL | 5 refills | Status: DC

## 2023-07-31 MED ORDER — LISINOPRIL 20 MG PO TABS: 20.0000 mg | ORAL_TABLET | Freq: Every day | ORAL | 1 refills | Status: DC

## 2023-07-31 MED ORDER — METFORMIN HCL 1000 MG PO TABS: 1000.0000 mg | ORAL_TABLET | Freq: Two times a day (BID) | ORAL | 1 refills | Status: DC

## 2023-07-31 MED ORDER — SERTRALINE HCL 50 MG PO TABS: ORAL_TABLET | ORAL | 1 refills | Status: DC

## 2023-07-31 MED ORDER — PIOGLITAZONE HCL 15 MG PO TABS: 15.0000 mg | ORAL_TABLET | Freq: Every day | ORAL | 1 refills | Status: DC

## 2023-07-31 MED ORDER — LOVASTATIN 40 MG PO TABS: ORAL_TABLET | ORAL | 1 refills | Status: DC

## 2023-07-31 NOTE — Progress Notes (Addendum)
 Date:  07/31/2023   Name:  Betty Walters   DOB:  [birth year]   MRN:  [id number]   Chief Complaint: Diabetes, Hypertension, and Hyperlipidemia  Diabetes She presents for her follow-up diabetic visit. She has type 2 diabetes mellitus. Her disease course has been stable. Hypoglycemia symptoms include nervousness/anxiousness. Associated symptoms include fatigue. Pertinent negatives for diabetes include no blurred vision, no chest pain, no foot paresthesias, no foot ulcerations, no polydipsia, no polyphagia, no polyuria, no visual change, no weakness and no weight loss. There are no hypoglycemic complications. Symptoms are stable. There are no diabetic complications. Pertinent negatives for diabetic complications include no CVA. Current diabetic treatment includes oral agent (dual therapy) (Metformin  and pioglitazone .  Also taking noninsulin injectable Ozempic). She is following a generally healthy diet. Meal planning includes avoidance of concentrated sweets and carbohydrate counting. She participates in exercise intermittently. Her home blood glucose trend is fluctuating minimally. An ACE inhibitor/angiotensin II receptor blocker is being taken. Eye exam is current.  Hypertension This is a chronic problem. The problem has been gradually improving since onset. The problem is controlled. Associated symptoms include anxiety. Pertinent negatives include no blurred vision, chest pain, malaise/fatigue, orthopnea, palpitations, peripheral edema or shortness of breath. Past treatments include ACE inhibitors (Lisinopril ). The current treatment provides moderate improvement. There is no history of CAD/MI or CVA. There is no history of chronic renal disease, a hypertension causing med or renovascular disease.  Hyperlipidemia This is a chronic problem. The current episode started more than 1 year ago. The problem is controlled. Recent lipid tests were reviewed and are normal. She has no history of chronic renal  disease. Pertinent negatives include no chest pain, myalgias or shortness of breath. Current antihyperlipidemic treatment includes statins and diet change. The current treatment provides moderate improvement of lipids. There are no compliance problems.  Risk factors for coronary artery disease include dyslipidemia and obesity.  Depression        This is a chronic problem.  The current episode started more than 1 year ago.   The problem occurs intermittently.  The problem has been gradually improving since onset.  Associated symptoms include fatigue, insomnia, restlessness and decreased interest.  Associated symptoms include no myalgias and no suicidal ideas.( Weight and appetite suppressed by Ozempic.)  Past treatments include SSRIs - Selective serotonin reuptake inhibitors (Sertraline  150 mg 100+50.  Milligrams.).  Past medical history includes anxiety.   Anxiety Presents for follow-up visit. Symptoms include excessive worry, insomnia, nervous/anxious behavior and restlessness. Patient reports no chest pain, palpitations, shortness of breath or suicidal ideas.      Lab Results  Component Value Date   NA 136 01/04/2023   K 3.8 01/04/2023   CO2 17 (L) 01/04/2023   GLUCOSE 153 (H) 01/04/2023   BUN 14 01/04/2023   CREATININE 0.88 01/04/2023   CALCIUM 10.0 01/04/2023   EGFR 70 07/29/2022   GFRNONAA >60 01/04/2023   Lab Results  Component Value Date   CHOL 198 10/11/2021   HDL 58 10/11/2021   LDLCALC 110 (H) 10/11/2021   TRIG 171 (H) 10/11/2021   CHOLHDL 4.6 (H) 08/05/2018   No results found for: "TSH" Lab Results  Component Value Date   HGBA1C 7.7 (H) 07/29/2022   Lab Results  Component Value Date   WBC 10.7 (H) 01/04/2023   HGB 11.8 (L) 01/04/2023   HCT 36.6 01/04/2023   MCV 90.1 01/04/2023   PLT 286 01/09/2023   Lab Results  Component Value Date  ALT 46 (H) 01/09/2023   AST 16 01/09/2023   ALKPHOS 149 (H) 01/09/2023   BILITOT <0.2 01/09/2023   No results found for:  "25OHVITD2", "25OHVITD3", "VD25OH"   Review of Systems  Constitutional:  Positive for fatigue. Negative for chills, malaise/fatigue and weight loss.  HENT:  Negative for congestion.   Eyes:  Negative for blurred vision.  Respiratory:  Negative for choking, chest tightness, shortness of breath, wheezing and stridor.   Cardiovascular:  Negative for chest pain, palpitations and orthopnea.  Gastrointestinal:  Negative for abdominal pain and blood in stool.  Endocrine: Negative for polydipsia, polyphagia and polyuria.  Genitourinary:  Negative for hematuria.  Musculoskeletal:  Negative for myalgias.  Neurological:  Negative for weakness.  Psychiatric/Behavioral:  Positive for depression. Negative for suicidal ideas. The patient is nervous/anxious and has insomnia.     Patient Active Problem List   Diagnosis Date Noted   Acute pyelonephritis 12/10/2021   AKI (acute kidney injury) (HCC) 12/10/2021   Type 2 diabetes mellitus with peripheral neuropathy (HCC) 12/10/2021   Dyslipidemia 12/10/2021   Depression 12/10/2021   GERD without esophagitis 12/10/2021   Sprain, metatarsophalangeal joint 01/26/2018   Heartburn    Dyspepsia    Change in bowel habits    Benign neoplasm of descending colon    Chronic pain of toes of both feet 09/20/2016   Type 2 diabetes mellitus with complication (HCC) 09/06/2015   Allergic rhinitis due to pollen 06/21/2015   Concussion with coma 10/21/2014   Clinical depression 09/28/2014   Diabetes mellitus, type 2 (HCC) 09/28/2014   HLD (hyperlipidemia) 09/28/2014   Essential hypertension 09/28/2014   Adiposity 09/28/2014    Allergies  Allergen Reactions   Amoxicillin -Pot Clavulanate Nausea And Vomiting    (OK with Amoxicillin  alone)    Past Surgical History:  Procedure Laterality Date   CESAREAN SECTION     COLONOSCOPY WITH PROPOFOL  N/A 04/11/2017   Procedure: COLONOSCOPY WITH colon;  Surgeon: Marnee Sink, MD;  Location: [facility];  Service:  Endoscopy;  Laterality: N/A;   ESOPHAGOGASTRODUODENOSCOPY (EGD) WITH PROPOFOL  N/A 04/11/2017   Procedure: ESOPHAGOGASTRODUODENOSCOPY (EGD) WITH PROPOFOL ;  Surgeon: Marnee Sink, MD;  Location: [facility];  Service: Endoscopy;  Laterality: N/A;  Diabetic - oral meds   GALLBLADDER SURGERY     KNEE SURGERY     MYRINGOTOMY WITH TUBE PLACEMENT Right 03/22/2020   Procedure: MYRINGOTOMY WITH TUBE PLACEMENT;  Surgeon: Rogers Clayman, MD;  Location: [facility];  Service: ENT;  Laterality: Right;  Diabetic   POLYPECTOMY  04/11/2017   Procedure: POLYPECTOMY INTESTINAL;  Surgeon: Marnee Sink, MD;  Location: [facility];  Service: Endoscopy;;   WRIST SURGERY      Social History   Tobacco Use   Smoking status: Never   Smokeless tobacco: Never  Vaping Use   Vaping status: Never Used  Substance Use Topics   Alcohol use: Yes    Alcohol/week: 0.0 standard drinks of alcohol    Comment: rare - Holidays   Drug use: No     Medication list has been reviewed and updated.  Current Meds  Medication Sig   celecoxib  (CELEBREX ) 100 MG capsule Take 1 capsule (100 mg total) by mouth 2 (two) times daily. (Patient taking differently: Take 100 mg by mouth as needed.)   dicyclomine  (BENTYL ) 20 MG tablet Take 1 tablet (20 mg total) by mouth 3 (three) times daily before meals.   gabapentin  (NEURONTIN ) 300 MG capsule Take 1 capsule by mouth at bedtime  Insulin  Pen Needle (PEN NEEDLES 3/16") 31G X 5 MM MISC 1 each by Does not apply route daily.   lisinopril  (ZESTRIL ) 20 MG tablet Take 1 tablet by mouth once daily   lovastatin  (MEVACOR ) 40 MG tablet Take 1 tablet by mouth once daily   metFORMIN  (GLUCOPHAGE ) 1000 MG tablet Take 1 tablet by mouth twice daily   ondansetron  (ZOFRAN -ODT) 4 MG disintegrating tablet Take 1 tablet (4 mg total) by mouth every 6 (six) hours as needed for nausea or vomiting.   pantoprazole  (PROTONIX ) 40 MG tablet Take 1 tablet (40 mg total) by mouth daily.    pioglitazone  (ACTOS ) 15 MG tablet Take 1 tablet by mouth once daily   sertraline  (ZOLOFT ) 100 MG tablet TAKE 1 TABLET BY MOUTH ONCE DAILY (ALONG  WITH  50  MG  TABLET)   sertraline  (ZOLOFT ) 50 MG tablet TAKE 1 TABLET BY MOUTH ONCE DAILY (ALONG  WITH  100  MG  TABLET)       01/09/2023    2:38 PM 07/29/2022    3:51 PM 05/30/2022    1:52 PM 02/25/2022    4:09 PM  GAD 7 : Generalized Anxiety Score  Nervous, Anxious, on Edge 0 0 0 0  Control/stop worrying 1 0 0 0  Worry too much - different things 1 0 0 0  Trouble relaxing 0 0 0 0  Restless 1 0 0 0  Easily annoyed or irritable 0 0 0 0  Afraid - awful might happen 0 0 0 0  Total GAD 7 Score 3 0 0 0  Anxiety Difficulty Not difficult at all Not difficult at all Not difficult at all Not difficult at all       01/09/2023    2:37 PM 07/29/2022    3:50 PM 05/30/2022    1:51 PM  Depression screen PHQ 2/9  Decreased Interest 1 0 0  Down, Depressed, Hopeless 0 0 0  PHQ - 2 Score 1 0 0  Altered sleeping 1 0 0  Tired, decreased energy 1 0 0  Change in appetite 2 0 0  Feeling bad or failure about yourself  0 0 0  Trouble concentrating 0 0 0  Moving slowly or fidgety/restless 0 0 0  Suicidal thoughts 0 0 0  PHQ-9 Score 5 0 0  Difficult doing work/chores Not difficult at all Not difficult at all Not difficult at all    BP Readings from Last 3 Encounters:  07/31/23 122/66  01/16/23 114/77  01/09/23 122/74    Physical Exam Constitutional:      General: She is not in acute distress.    Appearance: She is not diaphoretic.  HENT:     Head: Normocephalic and atraumatic.     Right Ear: Tympanic membrane and external ear normal.     Left Ear: Tympanic membrane and external ear normal.     Nose: Nose normal.     Mouth/Throat:     Mouth: Mucous membranes are moist.  Eyes:     General:        Right eye: No discharge.        Left eye: No discharge.     Conjunctiva/sclera: Conjunctivae normal.     Pupils: Pupils are equal, round, and  reactive to light.  Neck:     Thyroid: No thyromegaly.     Vascular: No JVD.  Cardiovascular:     Rate and Rhythm: Normal rate and regular rhythm.     Heart sounds: Normal heart sounds. No  murmur heard.    No friction rub. No gallop.  Pulmonary:     Effort: Pulmonary effort is normal.     Breath sounds: Normal breath sounds. No wheezing, rhonchi or rales.  Abdominal:     General: Bowel sounds are normal.     Palpations: Abdomen is soft. There is no mass.     Tenderness: There is no abdominal tenderness. There is no guarding.  Musculoskeletal:        General: Normal range of motion.     Cervical back: Normal range of motion and neck supple.  Lymphadenopathy:     Cervical: No cervical adenopathy.  Skin:    General: Skin is warm and dry.  Neurological:     Mental Status: She is alert.     Deep Tendon Reflexes: Reflexes are normal and symmetric.     Wt Readings from Last 3 Encounters:  07/31/23 208 lb 6 oz (94.5 kg)  01/16/23 225 lb (102.1 kg)  01/09/23 225 lb 12.8 oz (102.4 kg)    BP 122/66   Pulse 85   Ht 5\' 8"  (1.727 m)   Wt 208 lb 6 oz (94.5 kg)   SpO2 97%   BMI 31.68 kg/m   Assessment and Plan: 1. Essential hypertension (Primary) Chronic.  Controlled.  Stable.  Blood pressure 122/66.  Asymptomatic.  Tolerating medication well.  Continue lisinopril  20 mg once a day.  Will recheck in 6 months. - lisinopril  (ZESTRIL ) 20 MG tablet; Take 1 tablet (20 mg total) by mouth daily.  Dispense: 90 tablet; Refill: 1  2. Mixed hyperlipidemia Chronic.  Controlled.  Stable.  Asymptomatic.  Without myalgias or muscle weakness.  Continue lovastatin  40 mg once a day.  Will check lipid panel for current level of LDL control. - lovastatin  (MEVACOR ) 40 MG tablet; Take 1 tablet by mouth once daily  Dispense: 90 tablet; Refill: 1 - Lipid Panel With LDL/HDL Ratio  3. Type 2 diabetes mellitus without complication, without long-term current use of insulin  (HCC) Chronic.  Controlled.   Stable.  Followed by Dr. Shelvy Dickens endocrinology.  A1c seven 6.7 per [facility] endocrinology.  I will continue with her metformin  1 g twice a day and pioglitazone  15 mg once a day.  Foot exam was done and it was normal.  Patient is up-to-date with her microalbuminuria and A1c - metFORMIN  (GLUCOPHAGE ) 1000 MG tablet; Take 1 tablet (1,000 mg total) by mouth 2 (two) times daily.  Dispense: 60 tablet; Refill: 1 - pioglitazone  (ACTOS ) 15 MG tablet; Take 1 tablet (15 mg total) by mouth daily.  Dispense: 90 tablet; Refill: 1  4. Seasonal allergic rhinitis due to pollen Chronic.  Controlled.  Stable.  Controlled with Singulair  10 mg once a day. - montelukast  (SINGULAIR ) 10 MG tablet; Take 1 tablet (10 mg total) by mouth at bedtime.  Dispense: 30 tablet; Refill: 5  5. Recurrent major depressive disorder, in partial remission (HCC) Chronic.  Controlled.  Stable.  PHQ is 5 GAD score is 3.  Continue sertraline  150 mg 100+50 mg once a day.  Will recheck patient in 6 months. - sertraline  (ZOLOFT ) 100 MG tablet; TAKE 1 TABLET BY MOUTH ONCE DAILY (ALONG WITH 50 MG TABLET)  Dispense: 90 tablet; Refill: 1 - sertraline  (ZOLOFT ) 50 MG tablet; TAKE 1 TABLET BY MOUTH ONCE DAILY (ALONG WITH 100 MG TABLET)  Dispense: 90 tablet; Refill: 1  6. Long term (current) use of oral hypoglycemic drugs Patient doing well with Ozempic followed by Dr. Shelvy Dickens will probably continue at  current dosing.  Patient also will continue with metformin  and pioglitazone  which she is taking on a regular basis.  Check her previous A1c is 6.7 per [facility] clinic.  7. Diabetes mellitus treated with injections of non-insulin  medication (HCC) See above.    Alayne Allis, MD

## 2023-07-31 NOTE — Patient Instructions (Signed)

## 2023-08-01 ENCOUNTER — Other Ambulatory Visit: Payer: Self-pay

## 2023-08-01 LAB — LIPID PANEL WITH LDL/HDL RATIO
Cholesterol, Total: 194 mg/dL (ref 100–199)
HDL: 57 mg/dL (ref >39)
LDL Chol Calc (NIH): 106 mg/dL — ABNORMAL HIGH (ref 0–99)
LDL/HDL Ratio: 1.9 ratio (ref 0.0–3.2)
Triglycerides: 180 mg/dL — ABNORMAL HIGH (ref 0–149)
VLDL Cholesterol Cal: 31 mg/dL (ref 5–40)

## 2023-08-01 MED ORDER — PANTOPRAZOLE SODIUM 40 MG PO TBEC: 40.0000 mg | DELAYED_RELEASE_TABLET | Freq: Every day | ORAL | 0 refills | Status: DC

## 2023-08-01 MED ORDER — DICYCLOMINE HCL 20 MG PO TABS: 20.0000 mg | ORAL_TABLET | Freq: Three times a day (TID) | ORAL | 0 refills | Status: DC

## 2023-08-08 ENCOUNTER — Other Ambulatory Visit: Payer: Self-pay | Admitting: Podiatry

## 2023-10-06 ENCOUNTER — Other Ambulatory Visit: Payer: Self-pay | Admitting: Physician Assistant

## 2023-10-06 DIAGNOSIS — E119 Type 2 diabetes mellitus without complications: Secondary | ICD-10-CM

## 2023-10-06 NOTE — Telephone Encounter (Unsigned)
 Copied from CRM [id number]. Topic: Clinical - Medication Refill >> Oct 06, 2023  2:08 PM Carlatta H wrote: Medication: metFORMIN  (GLUCOPHAGE ) 1000 MG tablet [[id number]]  Has the patient contacted their pharmacy? No (Agent: If no, request that the patient contact the pharmacy for the refill. If patient does not wish to contact the pharmacy document the reason why and proceed with request.) (Agent: If yes, when and what did the pharmacy advise?)  This is the patient's preferred pharmacy:  [facility] [address] ([address]), [place] - [address] [place]) [place] [address] Phone: [phone] Fax: [phone]  Is this the correct pharmacy for this prescription? Yes If no, delete pharmacy and type the correct one.   Has the prescription been filled recently? No  Is the patient out of the medication? Yes  Has the patient been seen for an appointment in the last year OR does the patient have an upcoming appointment? No  Can we respond through MyChart? Yes  Agent: Please be advised that Rx refills may take up to 3 business days. We ask that you follow-up with your pharmacy.

## 2023-10-08 NOTE — Telephone Encounter (Signed)
 Requested medication (s) are due for refill today: yes  Requested medication (s) are on the active medication list: yes  Last refill:  07/31/23  Future visit scheduled: no  Notes to clinic:  Unable to refill per protocol, last refill by another provider no longer at this practice     Requested Prescriptions  Pending Prescriptions Disp Refills   metFORMIN  (GLUCOPHAGE ) 1000 MG tablet 60 tablet 1    Sig: Take 1 tablet (1,000 mg total) by mouth 2 (two) times daily.     Endocrinology:  Diabetes - Biguanides Failed - 10/08/2023  2:28 PM      Failed - B12 Level in normal range and within 720 days    No results found for: VITAMINB12       Failed - CBC within normal limits and completed in the last 12 months    WBC  Date Value Ref Range Status  01/04/2023 10.7 (H) 4.0 - 10.5 K/uL Final   RBC  Date Value Ref Range Status  01/04/2023 4.06 3.87 - 5.11 MIL/uL Final   Hemoglobin  Date Value Ref Range Status  01/04/2023 11.8 (L) 12.0 - 15.0 g/dL Final  89/81/7981 87.4 11.1 - 15.9 g/dL Final   HCT  Date Value Ref Range Status  01/04/2023 36.6 36.0 - 46.0 % Final   Hematocrit  Date Value Ref Range Status  01/16/2017 38.3 34.0 - 46.6 % Final   MCHC  Date Value Ref Range Status  01/04/2023 32.2 30.0 - 36.0 g/dL Final   [facility]  Date Value Ref Range Status  01/04/2023 29.1 26.0 - 34.0 pg Final   MCV  Date Value Ref Range Status  01/04/2023 90.1 80.0 - 100.0 fL Final  01/16/2017 87 79 - 97 fL Final   No results found for: PLTCOUNTKUC, LABPLAT, POCPLA RDW  Date Value Ref Range Status  01/04/2023 13.2 11.5 - 15.5 % Final  01/16/2017 14.9 12.3 - 15.4 % Final         Passed - Cr in normal range and within 360 days    Creatinine, Ser  Date Value Ref Range Status  01/04/2023 0.88 0.44 - 1.00 mg/dL Final   Creatinine, Urine  Date Value Ref Range Status  06/30/2023 183.9  Final         Passed - HBA1C is between 0 and 7.9 and within 180 days    Hemoglobin A1C  Date  Value Ref Range Status  06/30/2023 6.7  Final         Passed - eGFR in normal range and within 360 days    GFR calc Af Amer  Date Value Ref Range Status  04/25/2020 82 >59 mL/min/1.73 Final    Comment:    **In accordance with recommendations from the NKF-ASN Task force,**   [facility] is in the process of updating its eGFR calculation to the   2021 CKD-EPI creatinine equation that estimates kidney function   without a race variable.    GFR, Estimated  Date Value Ref Range Status  01/04/2023 >60 >60 mL/min Final    Comment:    (NOTE) Calculated using the CKD-EPI Creatinine Equation (2021)    eGFR  Date Value Ref Range Status  07/29/2022 70 >59 mL/min/1.73 Final         Passed - Valid encounter within last 6 months    Recent Outpatient Visits           2 months ago Essential hypertension   [facility]  [place], Deanna C, MD

## 2023-10-09 MED ORDER — METFORMIN HCL 1000 MG PO TABS: 1000.0000 mg | ORAL_TABLET | Freq: Two times a day (BID) | ORAL | 0 refills | Status: DC

## 2023-10-17 ENCOUNTER — Encounter: Payer: Self-pay | Admitting: Internal Medicine

## 2023-10-17 ENCOUNTER — Ambulatory Visit: Payer: Self-pay

## 2023-10-17 ENCOUNTER — Ambulatory Visit: Admitting: Internal Medicine

## 2023-10-17 ENCOUNTER — Other Ambulatory Visit: Payer: Self-pay | Admitting: Internal Medicine

## 2023-10-17 VITALS — BP 122/78 | HR 91 | Ht 68.0 in | Wt 209.0 lb

## 2023-10-17 DIAGNOSIS — N3000 Acute cystitis without hematuria: Secondary | ICD-10-CM | POA: Diagnosis not present

## 2023-10-17 LAB — POCT URINALYSIS DIPSTICK
Bilirubin, UA: NEGATIVE
Blood, UA: NEGATIVE
Glucose, UA: NEGATIVE
Ketones, UA: NEGATIVE
Protein, UA: NEGATIVE
Spec Grav, UA: 1.025 (ref 1.010–1.025)
Urobilinogen, UA: 0.2 U/dL
pH, UA: 6.5 (ref 5.0–8.0)

## 2023-10-17 MED ORDER — NITROFURANTOIN MONOHYD MACRO 100 MG PO CAPS
100.0000 mg | ORAL_CAPSULE | Freq: Two times a day (BID) | ORAL | 0 refills | Status: AC
Start: 2023-10-17 — End: 2023-10-24

## 2023-10-17 NOTE — Telephone Encounter (Signed)
 FYI Only or Action Required?: FYI only for provider.  Patient was last seen in primary care on 07/31/2023 by Joshua Cathryne BROCKS, MD.  Called Nurse Triage reporting Vaginitis.  Symptoms began several days ago.  Interventions attempted: Nothing.  Symptoms are: unchanged.  Triage Disposition: See PCP When Office is Open (Within 3 Days)  Patient/caregiver understands and will follow disposition?: Yes     Copied from CRM [id number]. Topic: Clinical - Medical Advice >> Oct 17, 2023  9:57 AM Myrick T wrote: Reason for CRM: patient stated she thinks she has a yeast infection and need the 1 day pill for her symptoms. Patient is leaving on Sunday going out of town for 2 weeks. Please f/u with patient Reason for Disposition  [1] Symptoms of a yeast infection (i.e., itchy, white discharge, not bad smelling) AND [2] not improved > 3 days following Care Advice  Answer Assessment - Initial Assessment Questions 1. SYMPTOM: What's the main symptom you're concerned about? (e.g., pain, itching, dryness)     Uncomfortable I feel like I'm not emptying all the way... when I use the bathroom Endorses itchy 2. LOCATION: Where is the  sx located? (e.g., inside/outside, left/right)     It's just there.. I don't know 3. ONSET: When did the  sx  start?     2-3 days ago 4. PAIN: Is there any pain? If Yes, ask: How bad is it? (Scale: 1-10; mild, moderate, severe)     Endorses being uncomfortable 5. ITCHING: Is there any itching? If Yes, ask: How bad is it? (Scale: 1-10; mild, moderate, severe)     2-3/10 - an irritation 6. CAUSE: What do you think is causing the discharge? Have you had the same problem before? What happened then?     Minimal discharge Yeast infection - reports similar sx Endorses being diabetic 7. OTHER SYMPTOMS: Do you have any other symptoms? (e.g., fever, itching, vaginal bleeding, pain with urination, injury to genital area, vaginal foreign body)     My pee is  like a clear with a hint of yellow Denies other sx 8. PREGNANCY: Is there any chance you are pregnant? When was your last menstrual period?     N/a    Triager unable to schedule acute visit at PCP office. Triager called PCP CAL and Alan notified of disposition, patient transferred for scheduling.  Protocols used: Vaginal Symptoms-A-AH

## 2023-10-17 NOTE — Progress Notes (Signed)
 Date:  10/17/2023   Name:  Betty Walters   DOB:  03/12/1961   MRN:  [id number]   Chief Complaint: Urinary Tract Infection (Urinary frequency, pressure, and lower back pain.)  Urinary Tract Infection  This is a new problem. The current episode started in the past 7 days. The problem occurs every urination. The problem has been unchanged. The patient is experiencing no pain. There has been no fever. Associated symptoms include urgency. Pertinent negatives include no chills, hematuria or vomiting. She has tried nothing for the symptoms.    Review of Systems  Constitutional:  Negative for chills, fatigue and unexpected weight change.  HENT:  Negative for trouble swallowing.   Eyes:  Negative for visual disturbance.  Respiratory:  Negative for cough, chest tightness, shortness of breath and wheezing.   Cardiovascular:  Negative for chest pain, palpitations and leg swelling.  Gastrointestinal:  Negative for abdominal pain, constipation, diarrhea and vomiting.  Genitourinary:  Positive for dysuria and urgency. Negative for hematuria.  Musculoskeletal:  Negative for arthralgias and myalgias.  Neurological:  Negative for dizziness, weakness, light-headedness and headaches.  Psychiatric/Behavioral:  Negative for dysphoric mood and sleep disturbance. The patient is not nervous/anxious.      Lab Results  Component Value Date   NA 136 01/04/2023   K 3.8 01/04/2023   CO2 17 (L) 01/04/2023   GLUCOSE 153 (H) 01/04/2023   BUN 14 01/04/2023   CREATININE 0.88 01/04/2023   CALCIUM 10.0 01/04/2023   EGFR 70 07/29/2022   GFRNONAA >60 01/04/2023   Lab Results  Component Value Date   CHOL 194 07/31/2023   HDL 57 07/31/2023   LDLCALC 106 (H) 07/31/2023   TRIG 180 (H) 07/31/2023   CHOLHDL 4.6 (H) 08/05/2018   No results found for: TSH Lab Results  Component Value Date   HGBA1C 6.7 06/30/2023   Lab Results  Component Value Date   WBC 10.7 (H) 01/04/2023   HGB 11.8 (L) 01/04/2023    HCT 36.6 01/04/2023   MCV 90.1 01/04/2023   PLT 286 01/09/2023   Lab Results  Component Value Date   ALT 46 (H) 01/09/2023   AST 16 01/09/2023   ALKPHOS 149 (H) 01/09/2023   BILITOT <0.2 01/09/2023   No results found for: MARIEN BOLLS, VD25OH   Patient Active Problem List   Diagnosis Date Noted   Acute pyelonephritis 12/10/2021   AKI (acute kidney injury) (HCC) 12/10/2021   Type 2 diabetes mellitus with peripheral neuropathy (HCC) 12/10/2021   Dyslipidemia 12/10/2021   Depression 12/10/2021   GERD without esophagitis 12/10/2021   Sprain, metatarsophalangeal joint 01/26/2018   Heartburn    Dyspepsia    Change in bowel habits    Benign neoplasm of descending colon    Chronic pain of toes of both feet 09/20/2016   Type 2 diabetes mellitus with complication (HCC) 09/06/2015   Allergic rhinitis due to pollen 06/21/2015   Concussion with coma 10/21/2014   Clinical depression 09/28/2014   Diabetes mellitus, type 2 (HCC) 09/28/2014   HLD (hyperlipidemia) 09/28/2014   Essential hypertension 09/28/2014   Adiposity 09/28/2014    Allergies  Allergen Reactions   Amoxicillin -Pot Clavulanate Nausea And Vomiting    (OK with Amoxicillin  alone)    Past Surgical History:  Procedure Laterality Date   CESAREAN SECTION     COLONOSCOPY WITH PROPOFOL  N/A 04/11/2017   Procedure: COLONOSCOPY WITH colon;  Surgeon: Jinny Carmine, MD;  Location: [facility];  Service: Endoscopy;  Laterality: N/A;  ESOPHAGOGASTRODUODENOSCOPY (EGD) WITH PROPOFOL  N/A 04/11/2017   Procedure: ESOPHAGOGASTRODUODENOSCOPY (EGD) WITH PROPOFOL ;  Surgeon: Jinny Carmine, MD;  Location: [facility];  Service: Endoscopy;  Laterality: N/A;  Diabetic - oral meds   GALLBLADDER SURGERY     KNEE SURGERY     MYRINGOTOMY WITH TUBE PLACEMENT Right 03/22/2020   Procedure: MYRINGOTOMY WITH TUBE PLACEMENT;  Surgeon: Milissa Hamming, MD;  Location: [facility];  Service: ENT;  Laterality: Right;   Diabetic   POLYPECTOMY  04/11/2017   Procedure: POLYPECTOMY INTESTINAL;  Surgeon: Jinny Carmine, MD;  Location: [facility];  Service: Endoscopy;;   WRIST SURGERY      Social History   Tobacco Use   Smoking status: Never   Smokeless tobacco: Never  Vaping Use   Vaping status: Never Used  Substance Use Topics   Alcohol use: Yes    Alcohol/week: 0.0 standard drinks of alcohol    Comment: rare - Holidays   Drug use: No     Medication list has been reviewed and updated.  Current Meds  Medication Sig   celecoxib  (CELEBREX ) 100 MG capsule Take 1 capsule (100 mg total) by mouth 2 (two) times daily. (Patient taking differently: Take 100 mg by mouth as needed.)   cyclobenzaprine  (FLEXERIL ) 5 MG tablet Take 1 tablet (5 mg total) by mouth at bedtime as needed.   dicyclomine  (BENTYL ) 20 MG tablet Take 1 tablet (20 mg total) by mouth 3 (three) times daily before meals.   fluconazole  (DIFLUCAN ) 150 MG tablet Take 150 mg by mouth once.   fluticasone  (FLONASE ) 50 MCG/ACT nasal spray Place 2 sprays into both nostrils daily.   gabapentin  (NEURONTIN ) 300 MG capsule Take 1 capsule by mouth at bedtime   HYDROcodone -acetaminophen  (NORCO/VICODIN) 5-325 MG tablet Take 2 tablets by mouth every 8 (eight) hours as needed.   Insulin  Pen Needle (PEN NEEDLES 3/16) 31G X 5 MM MISC 1 each by Does not apply route daily.   liraglutide  (VICTOZA ) 18 MG/3ML SOPN INJECT 1.8MG  INTO THE SKIN ONCE A DAY   lisinopril  (ZESTRIL ) 20 MG tablet Take 1 tablet (20 mg total) by mouth daily.   lovastatin  (MEVACOR ) 40 MG tablet Take 1 tablet by mouth once daily   metFORMIN  (GLUCOPHAGE ) 1000 MG tablet Take 1 tablet (1,000 mg total) by mouth 2 (two) times daily.   montelukast  (SINGULAIR ) 10 MG tablet Take 1 tablet (10 mg total) by mouth at bedtime.   nitrofurantoin, macrocrystal-monohydrate, (MACROBID) 100 MG capsule Take 1 capsule (100 mg total) by mouth 2 (two) times daily for 7 days.   nystatin  cream (MYCOSTATIN ) Apply 1  Application topically 2 (two) times daily.   ondansetron  (ZOFRAN -ODT) 4 MG disintegrating tablet Take 1 tablet (4 mg total) by mouth every 6 (six) hours as needed for nausea or vomiting.   OZEMPIC, 2 MG/DOSE, 8 MG/3ML SOPN Inject 2 mg into the skin once a week.   pantoprazole  (PROTONIX ) 40 MG tablet Take 1 tablet (40 mg total) by mouth daily.   pioglitazone  (ACTOS ) 15 MG tablet Take 1 tablet (15 mg total) by mouth daily.   sertraline  (ZOLOFT ) 100 MG tablet TAKE 1 TABLET BY MOUTH ONCE DAILY (ALONG WITH 50 MG TABLET)   sertraline  (ZOLOFT ) 50 MG tablet TAKE 1 TABLET BY MOUTH ONCE DAILY (ALONG WITH 100 MG TABLET)       10/17/2023    2:10 PM 01/09/2023    2:38 PM 07/29/2022    3:51 PM 05/30/2022    1:52 PM  GAD 7 : Generalized  Anxiety Score  Nervous, Anxious, on Edge 0 0 0 0  Control/stop worrying 0 1 0 0  Worry too much - different things 0 1 0 0  Trouble relaxing 0 0 0 0  Restless 0 1 0 0  Easily annoyed or irritable 1 0 0 0  Afraid - awful might happen 0 0 0 0  Total GAD 7 Score 1 3 0 0  Anxiety Difficulty Not difficult at all Not difficult at all Not difficult at all Not difficult at all       10/17/2023    2:10 PM 01/09/2023    2:37 PM 07/29/2022    3:50 PM  Depression screen PHQ 2/9  Decreased Interest 0 1 0  Down, Depressed, Hopeless 0 0 0  PHQ - 2 Score 0 1 0  Altered sleeping 1 1 0  Tired, decreased energy 1 1 0  Change in appetite 0 2 0  Feeling bad or failure about yourself  0 0 0  Trouble concentrating 0 0 0  Moving slowly or fidgety/restless 1 0 0  Suicidal thoughts 0 0 0  PHQ-9 Score 3 5 0  Difficult doing work/chores Not difficult at all Not difficult at all Not difficult at all    BP Readings from Last 3 Encounters:  10/17/23 122/78  07/31/23 122/66  01/16/23 114/77    Physical Exam Vitals and nursing note reviewed.  Constitutional:      Appearance: She is well-developed.  Cardiovascular:     Rate and Rhythm: Normal rate and regular rhythm.     Heart  sounds: Normal heart sounds.  Pulmonary:     Effort: Pulmonary effort is normal. No respiratory distress.     Breath sounds: Normal breath sounds.  Abdominal:     General: Bowel sounds are normal.     Palpations: Abdomen is soft.     Tenderness: There is no abdominal tenderness. There is no right CVA tenderness, left CVA tenderness, guarding or rebound.    Lab Results  Component Value Date   COLORU yellow 10/17/2023   CLARITYU cloudy 10/17/2023   GLUCOSEUR Negative 10/17/2023   BILIRUBINUR neg 10/17/2023   KETONESU neg 10/17/2023   SPECGRAV 1.025 10/17/2023   RBCUR negaitve 10/17/2023   PHUR 6.5 10/17/2023   PROTEINUR Negative 10/17/2023   UROBILINOGEN 0.2 10/17/2023   LEUKOCYTESUR Small (1+) (A) 10/17/2023     Wt Readings from Last 3 Encounters:  10/17/23 209 lb (94.8 kg)  07/31/23 208 lb 6 oz (94.5 kg)  01/16/23 225 lb (102.1 kg)    BP 122/78   Pulse 91   Ht 5' 8 (1.727 m)   Wt 209 lb (94.8 kg)   SpO2 99%   BMI 31.78 kg/m   Assessment and Plan:  Problem List Items Addressed This Visit   None Visit Diagnoses       Acute cystitis without hematuria    -  Primary   push fluids; treat presumptively with Macrobid will get Cx and adjust if needed   Relevant Medications   nitrofurantoin, macrocrystal-monohydrate, (MACROBID) 100 MG capsule   Other Relevant Orders   POCT urinalysis dipstick (Completed)   Urine Culture       No follow-ups on file.    Leita HILARIO Adie, MD [facility] [place]

## 2023-10-17 NOTE — Telephone Encounter (Signed)
 Noted  KP

## 2023-10-21 ENCOUNTER — Ambulatory Visit: Payer: Self-pay | Admitting: Internal Medicine

## 2023-10-21 LAB — URINE CULTURE: Organism ID, Bacteria: NO GROWTH

## 2023-11-06 ENCOUNTER — Encounter: Payer: Self-pay | Admitting: Physician Assistant

## 2023-11-06 ENCOUNTER — Ambulatory Visit: Admitting: Physician Assistant

## 2023-11-06 VITALS — BP 134/84 | HR 88 | Temp 98.0°F | Ht 68.0 in | Wt 205.0 lb

## 2023-11-06 DIAGNOSIS — Z7984 Long term (current) use of oral hypoglycemic drugs: Secondary | ICD-10-CM | POA: Insufficient documentation

## 2023-11-06 DIAGNOSIS — Z7985 Long-term (current) use of injectable non-insulin antidiabetic drugs: Secondary | ICD-10-CM | POA: Diagnosis not present

## 2023-11-06 DIAGNOSIS — K219 Gastro-esophageal reflux disease without esophagitis: Secondary | ICD-10-CM

## 2023-11-06 DIAGNOSIS — E1142 Type 2 diabetes mellitus with diabetic polyneuropathy: Secondary | ICD-10-CM | POA: Diagnosis not present

## 2023-11-06 DIAGNOSIS — Z79899 Other long term (current) drug therapy: Secondary | ICD-10-CM | POA: Insufficient documentation

## 2023-11-06 DIAGNOSIS — R197 Diarrhea, unspecified: Secondary | ICD-10-CM | POA: Insufficient documentation

## 2023-11-06 LAB — POCT GLYCOSYLATED HEMOGLOBIN (HGB A1C): Hemoglobin A1C: 6.3 % — AB (ref 4.0–5.6)

## 2023-11-06 MED ORDER — PANTOPRAZOLE SODIUM 40 MG PO TBEC: 40.0000 mg | DELAYED_RELEASE_TABLET | Freq: Every day | ORAL | 0 refills | Status: AC

## 2023-11-06 MED ORDER — DICYCLOMINE HCL 20 MG PO TABS: 20.0000 mg | ORAL_TABLET | Freq: Three times a day (TID) | ORAL | 1 refills | Status: DC | PRN

## 2023-11-06 MED ORDER — METFORMIN HCL ER 750 MG PO TB24: 750.0000 mg | ORAL_TABLET | Freq: Every day | ORAL | 1 refills | Status: AC

## 2023-11-06 NOTE — Assessment & Plan Note (Addendum)
 A1c 6.3% today reflecting ongoing glycemic control.  Patient asks if she can continue maintenance of diabetes through PCP instead of seeing endocrinology.  At this time, I would feel very comfortable managing this patient in our office, and she may cancel her future endocrinology appointments if desired.   Continue pioglitazone , switch metformin  to the extended release form which may improve her diarrhea.  Happy to write the prescription for Ozempic when it comes due. Advised the importance of adequate protein intake on this medication as well as resistance training which will not only improve insulin  sensitivity but also build muscle mass and reduce risk of atrophy.  Advised importance of portion control and slower eating on this medication to minimize GI side effects. Avoid high fat foods as these may cause discomfort. Patient aware of potential for weight regain when eventually stopping the medication. For this reason, continued efforts toward improving lifestyle will be particularly important moving forward.

## 2023-11-06 NOTE — Assessment & Plan Note (Signed)
 Several medication changes made today, will gradually work toward reducing her medication burden.  Probably next priority would be attempt at PPI taper, also may consider reduction of sertraline  dose which could help with her weight loss journey.

## 2023-11-06 NOTE — Progress Notes (Signed)
 Date:  11/06/2023   Name:  Betty Walters   DOB:  1960-11-24   MRN:  [id number]   Chief Complaint: Medical Management of Chronic Issues  HPI Betty Walters is a pleasant 63 year old female with a history of DM2, arthritis, HTN, HLD, GERD, and depression who presents new to me today as a transfer of care from my recently retired Animator Dr. Cathryne Molt.  Patient was last seen in this clinic 07/31/2023.  She currently takes Ozempic 2 mg/week as prescribed by endocrinology after establishing care with them about a year ago and titrating up.  She does not currently have any strength or resistance training in place, and is concerned with the excess skin and subcutaneous tissue accumulating in her upper arms.  She has lost about 36 pounds since initiation of this medication, and A1c has been well-controlled, most recently 6.7% about 4 months ago.  She has also been diagnosed with IBS-D and prescribed dicyclomine  by gastroenterology.  That said, she has been taking metformin  1000 mg twice daily and has not had a trial off of this medication to see if it improves her diarrhea.  She raises concerns with her medication list, and would like to shrink it if possible.  Medication list has been reviewed and updated.  Current Meds  Medication Sig   celecoxib  (CELEBREX ) 100 MG capsule Take 1 capsule (100 mg total) by mouth 2 (two) times daily. (Patient taking differently: Take 100 mg by mouth as needed.)   cyclobenzaprine  (FLEXERIL ) 5 MG tablet Take 1 tablet (5 mg total) by mouth at bedtime as needed.   fluticasone  (FLONASE ) 50 MCG/ACT nasal spray Place 2 sprays into both nostrils daily.   gabapentin  (NEURONTIN ) 300 MG capsule Take 1 capsule by mouth at bedtime   HYDROcodone -acetaminophen  (NORCO/VICODIN) 5-325 MG tablet Take 2 tablets by mouth every 8 (eight) hours as needed.   Insulin  Pen Needle (PEN NEEDLES 3/16) 31G X 5 MM MISC 1 each by Does not apply route daily.   lisinopril  (ZESTRIL ) 20 MG tablet Take  1 tablet (20 mg total) by mouth daily.   lovastatin  (MEVACOR ) 40 MG tablet Take 1 tablet by mouth once daily   metFORMIN  (GLUCOPHAGE -XR) 750 MG 24 hr tablet Take 1 tablet (750 mg total) by mouth daily with breakfast.   montelukast  (SINGULAIR ) 10 MG tablet Take 1 tablet (10 mg total) by mouth at bedtime.   nystatin  cream (MYCOSTATIN ) Apply 1 Application topically 2 (two) times daily.   ondansetron  (ZOFRAN -ODT) 4 MG disintegrating tablet Take 1 tablet (4 mg total) by mouth every 6 (six) hours as needed for nausea or vomiting.   OZEMPIC, 2 MG/DOSE, 8 MG/3ML SOPN Inject 2 mg into the skin once a week.   pioglitazone  (ACTOS ) 15 MG tablet Take 1 tablet (15 mg total) by mouth daily.   sertraline  (ZOLOFT ) 100 MG tablet TAKE 1 TABLET BY MOUTH ONCE DAILY (ALONG WITH 50 MG TABLET)   sertraline  (ZOLOFT ) 50 MG tablet TAKE 1 TABLET BY MOUTH ONCE DAILY (ALONG WITH 100 MG TABLET)   [DISCONTINUED] dicyclomine  (BENTYL ) 20 MG tablet Take 1 tablet (20 mg total) by mouth 3 (three) times daily before meals.   [DISCONTINUED] fluconazole  (DIFLUCAN ) 150 MG tablet Take 150 mg by mouth once.   [DISCONTINUED] liraglutide  (VICTOZA ) 18 MG/3ML SOPN INJECT 1.8MG  INTO THE SKIN ONCE A DAY   [DISCONTINUED] metFORMIN  (GLUCOPHAGE ) 1000 MG tablet Take 1 tablet (1,000 mg total) by mouth 2 (two) times daily.   [DISCONTINUED] pantoprazole  (PROTONIX ) 40 MG tablet Take  1 tablet (40 mg total) by mouth daily.     Review of Systems  Patient Active Problem List   Diagnosis Date Noted   Long-term current use of injectable noninsulin antidiabetic medication 11/06/2023   Long-term current use of proton pump inhibitor therapy 11/06/2023   Long term current use of oral hypoglycemic drug 11/06/2023   Diarrhea 11/06/2023   Polypharmacy 11/06/2023   Acute pyelonephritis 12/10/2021   AKI (acute kidney injury) (HCC) 12/10/2021   Type 2 diabetes mellitus with peripheral neuropathy (HCC) 12/10/2021   Dyslipidemia 12/10/2021   Depression  12/10/2021   GERD without esophagitis 12/10/2021   Sprain, metatarsophalangeal joint 01/26/2018   Heartburn    Dyspepsia    Change in bowel habits    Benign neoplasm of descending colon    Chronic pain of toes of both feet 09/20/2016   Type 2 diabetes mellitus with complication (HCC) 09/06/2015   Allergic rhinitis due to pollen 06/21/2015   Concussion with coma 10/21/2014   Clinical depression 09/28/2014   Diabetes mellitus, type 2 (HCC) 09/28/2014   HLD (hyperlipidemia) 09/28/2014   Essential hypertension 09/28/2014   Obesity, Class I, BMI 30-34.9 09/28/2014    Allergies  Allergen Reactions   Amoxicillin -Pot Clavulanate Nausea And Vomiting    (OK with Amoxicillin  alone)    Immunization History  Administered Date(s) Administered   Influenza, Quadrivalent, Recombinant, Inj, Pf 02/16/2019   Influenza,inj,Quad PF,6+ Mos 01/11/2016, 11/28/2016, 12/22/2017, 04/25/2020, 01/16/2021, 12/25/2021   Influenza-Unspecified 01/10/2023   PFIZER(Purple Top)SARS-COV-2 Vaccination 05/30/2019, 06/21/2019   Pneumococcal Polysaccharide-23 02/10/2012, 04/04/2017   Tdap 02/13/2014, 01/14/2018    Past Surgical History:  Procedure Laterality Date   CESAREAN SECTION     COLONOSCOPY WITH PROPOFOL  N/A 04/11/2017   Procedure: COLONOSCOPY WITH colon;  Surgeon: Jinny Carmine, MD;  Location: [facility];  Service: Endoscopy;  Laterality: N/A;   ESOPHAGOGASTRODUODENOSCOPY (EGD) WITH PROPOFOL  N/A 04/11/2017   Procedure: ESOPHAGOGASTRODUODENOSCOPY (EGD) WITH PROPOFOL ;  Surgeon: Jinny Carmine, MD;  Location: [facility];  Service: Endoscopy;  Laterality: N/A;  Diabetic - oral meds   GALLBLADDER SURGERY     KNEE SURGERY     MYRINGOTOMY WITH TUBE PLACEMENT Right 03/22/2020   Procedure: MYRINGOTOMY WITH TUBE PLACEMENT;  Surgeon: Milissa Hamming, MD;  Location: [facility];  Service: ENT;  Laterality: Right;  Diabetic   POLYPECTOMY  04/11/2017   Procedure: POLYPECTOMY INTESTINAL;  Surgeon:  Jinny Carmine, MD;  Location: [facility];  Service: Endoscopy;;   WRIST SURGERY      Social History   Tobacco Use   Smoking status: Never   Smokeless tobacco: Never  Vaping Use   Vaping status: Never Used  Substance Use Topics   Alcohol use: Yes    Alcohol/week: 0.0 standard drinks of alcohol    Comment: rare - Holidays   Drug use: No    Family History  Problem Relation Age of Onset   Cancer Mother        breast   Diabetes Father         10/17/2023    2:10 PM 01/09/2023    2:38 PM 07/29/2022    3:51 PM 05/30/2022    1:52 PM  GAD 7 : Generalized Anxiety Score  Nervous, Anxious, on Edge 0 0 0 0  Control/stop worrying 0 1 0 0  Worry too much - different things 0 1 0 0  Trouble relaxing 0 0 0 0  Restless 0 1 0 0  Easily annoyed or irritable 1 0 0 0  Afraid - awful might happen 0 0 0 0  Total GAD 7 Score 1 3 0 0  Anxiety Difficulty Not difficult at all Not difficult at all Not difficult at all Not difficult at all       10/17/2023    2:10 PM 01/09/2023    2:37 PM 07/29/2022    3:50 PM  Depression screen PHQ 2/9  Decreased Interest 0 1 0  Down, Depressed, Hopeless 0 0 0  PHQ - 2 Score 0 1 0  Altered sleeping 1 1 0  Tired, decreased energy 1 1 0  Change in appetite 0 2 0  Feeling bad or failure about yourself  0 0 0  Trouble concentrating 0 0 0  Moving slowly or fidgety/restless 1 0 0  Suicidal thoughts 0 0 0  PHQ-9 Score 3 5 0  Difficult doing work/chores Not difficult at all Not difficult at all Not difficult at all    BP Readings from Last 3 Encounters:  11/06/23 134/84  10/17/23 122/78  07/31/23 122/66    Wt Readings from Last 3 Encounters:  11/06/23 205 lb (93 kg)  10/17/23 209 lb (94.8 kg)  07/31/23 208 lb 6 oz (94.5 kg)    BP 134/84 (Cuff Size: Large)   Pulse 88   Temp 98 F (36.7 C)   Ht 5' 8 (1.727 m)   Wt 205 lb (93 kg)   SpO2 100%   BMI 31.17 kg/m   Physical Exam Vitals and nursing note reviewed.  Constitutional:       Appearance: Normal appearance. She is obese.  Cardiovascular:     Rate and Rhythm: Normal rate.  Pulmonary:     Effort: Pulmonary effort is normal.  Abdominal:     General: There is no distension.  Musculoskeletal:        General: Normal range of motion.  Skin:    General: Skin is warm and dry.  Neurological:     Mental Status: She is alert and oriented to person, place, and time.     Gait: Gait is intact.  Psychiatric:        Mood and Affect: Mood and affect normal.     Recent Labs     Component Value Date/Time   NA 136 01/04/2023 1945   NA 140 07/29/2022 1632   K 3.8 01/04/2023 1945   CL 105 01/04/2023 1945   CO2 17 (L) 01/04/2023 1945   GLUCOSE 153 (H) 01/04/2023 1945   BUN 14 01/04/2023 1945   BUN 21 07/29/2022 1632   CREATININE 0.88 01/04/2023 1945   CALCIUM 10.0 01/04/2023 1945   PROT 7.0 01/09/2023 1515   ALBUMIN 4.5 01/09/2023 1515   AST 16 01/09/2023 1515   ALT 46 (H) 01/09/2023 1515   ALKPHOS 149 (H) 01/09/2023 1515   BILITOT <0.2 01/09/2023 1515   GFRNONAA >60 01/04/2023 1945   GFRAA 82 04/25/2020 1122    Lab Results  Component Value Date   WBC 10.7 (H) 01/04/2023   HGB 11.8 (L) 01/04/2023   HCT 36.6 01/04/2023   MCV 90.1 01/04/2023   PLT 286 01/09/2023   Lab Results  Component Value Date   HGBA1C 6.3 (A) 11/06/2023   HGBA1C 6.7 06/30/2023   HGBA1C 7.7 (H) 07/29/2022   Lab Results  Component Value Date   CHOL 194 07/31/2023   HDL 57 07/31/2023   LDLCALC 106 (H) 07/31/2023   TRIG 180 (H) 07/31/2023   CHOLHDL 4.6 (H) 08/05/2018   No results found for:  TSH    Assessment and Plan:  Type 2 diabetes mellitus with peripheral neuropathy (HCC) Assessment & Plan: A1c 6.3% today reflecting ongoing glycemic control.  Patient asks if she can continue maintenance of diabetes through PCP instead of seeing endocrinology.  At this time, I would feel very comfortable managing this patient in our office, and she may cancel her future endocrinology  appointments if desired.   Continue pioglitazone , switch metformin  to the extended release form which may improve her diarrhea.  Happy to write the prescription for Ozempic when it comes due. Advised the importance of adequate protein intake on this medication as well as resistance training which will not only improve insulin  sensitivity but also build muscle mass and reduce risk of atrophy.  Advised importance of portion control and slower eating on this medication to minimize GI side effects. Avoid high fat foods as these may cause discomfort. Patient aware of potential for weight regain when eventually stopping the medication. For this reason, continued efforts toward improving lifestyle will be particularly important moving forward.    Orders: -     metFORMIN  HCl ER; Take 1 tablet (750 mg total) by mouth daily with breakfast.  Dispense: 90 tablet; Refill: 1 -     POCT glycosylated hemoglobin (Hb A1C)  Long term current use of oral hypoglycemic drug  Long-term current use of injectable noninsulin antidiabetic medication  GERD without esophagitis Assessment & Plan: Refill pantoprazole , but plan for taper at a future visit  Orders: -     Pantoprazole  Sodium; Take 1 tablet (40 mg total) by mouth daily.  Dispense: 90 tablet; Refill: 0  Long-term current use of proton pump inhibitor therapy Assessment & Plan: Discussed potential risks of long-term PPI therapy especially in this aging female.  In the future we will discuss potential taper, but for now we will leave this prescription alone as we are changing other things with her medication regimen.  Orders: -     Pantoprazole  Sodium; Take 1 tablet (40 mg total) by mouth daily.  Dispense: 90 tablet; Refill: 0  Diarrhea, unspecified type Assessment & Plan: Switch to metformin  XR.  Refilling dicyclomine .  If diarrhea frequency improves, she may decrease her use of dicyclomine .  Orders: -     Dicyclomine  HCl; Take 1 tablet (20 mg total) by  mouth 3 (three) times daily as needed for spasms.  Dispense: 270 tablet; Refill: 1  Polypharmacy Assessment & Plan: Several medication changes made today, will gradually work toward reducing her medication burden.  Probably next priority would be attempt at PPI taper, also may consider reduction of sertraline  dose which could help with her weight loss journey.      Return in about 3 months (around 02/06/2024) for OV f/u chronic conditions.    Rolan Hoyle, PA-C, DMSc, Nutritionist [facility] [phone]

## 2023-11-06 NOTE — Assessment & Plan Note (Signed)
 Discussed potential risks of long-term PPI therapy especially in this aging female.  In the future we will discuss potential taper, but for now we will leave this prescription alone as we are changing other things with her medication regimen.

## 2023-11-06 NOTE — Assessment & Plan Note (Signed)
 Switch to metformin  XR.  Refilling dicyclomine .  If diarrhea frequency improves, she may decrease her use of dicyclomine .

## 2023-11-06 NOTE — Assessment & Plan Note (Signed)
 Refill pantoprazole , but plan for taper at a future visit

## 2023-11-28 ENCOUNTER — Other Ambulatory Visit: Payer: Self-pay | Admitting: Physician Assistant

## 2023-11-28 DIAGNOSIS — E1142 Type 2 diabetes mellitus with diabetic polyneuropathy: Secondary | ICD-10-CM

## 2023-11-28 MED ORDER — OZEMPIC (2 MG/DOSE) 8 MG/3ML ~~LOC~~ SOPN: 2.0000 mg | PEN_INJECTOR | SUBCUTANEOUS | 0 refills | Status: DC

## 2023-12-17 ENCOUNTER — Other Ambulatory Visit: Payer: Self-pay | Admitting: Podiatry

## 2024-01-09 ENCOUNTER — Telehealth: Admitting: Physician Assistant

## 2024-01-09 DIAGNOSIS — J019 Acute sinusitis, unspecified: Secondary | ICD-10-CM

## 2024-01-09 DIAGNOSIS — T3695XA Adverse effect of unspecified systemic antibiotic, initial encounter: Secondary | ICD-10-CM | POA: Diagnosis not present

## 2024-01-09 DIAGNOSIS — B379 Candidiasis, unspecified: Secondary | ICD-10-CM | POA: Diagnosis not present

## 2024-01-09 DIAGNOSIS — B9689 Other specified bacterial agents as the cause of diseases classified elsewhere: Secondary | ICD-10-CM

## 2024-01-09 MED ORDER — FLUCONAZOLE 150 MG PO TABS: 150.0000 mg | ORAL_TABLET | ORAL | 0 refills | Status: DC | PRN

## 2024-01-09 MED ORDER — DOXYCYCLINE HYCLATE 100 MG PO TABS: 100.0000 mg | ORAL_TABLET | Freq: Two times a day (BID) | ORAL | 0 refills | Status: DC

## 2024-01-09 NOTE — Patient Instructions (Signed)
 Betty Walters, thank you for joining Delon CHRISTELLA Dickinson, PA-C for today's virtual visit.  While this provider is not your primary care provider (PCP), if your PCP is located in our provider database this encounter information will be shared with them immediately following your visit.   A [facility] MyChart account gives you access to today's visit and all your visits, tests, and labs performed at [facility]  click here if you don't have a [facility] MyChart account or go to [email or web address]  Consent: (Patient) Betty Walters provided verbal consent for this virtual visit at the beginning of the encounter.  Current Medications:  Current Outpatient Medications:    doxycycline  (VIBRA -TABS) 100 MG tablet, Take 1 tablet (100 mg total) by mouth 2 (two) times daily., Disp: 14 tablet, Rfl: 0   fluconazole  (DIFLUCAN ) 150 MG tablet, Take 1 tablet (150 mg total) by mouth every 3 (three) days as needed., Disp: 2 tablet, Rfl: 0   celecoxib  (CELEBREX ) 100 MG capsule, Take 1 capsule (100 mg total) by mouth 2 (two) times daily. (Patient taking differently: Take 100 mg by mouth as needed.), Disp: 120 capsule, Rfl: 1   cyclobenzaprine  (FLEXERIL ) 5 MG tablet, Take 1 tablet (5 mg total) by mouth at bedtime as needed., Disp: 30 tablet, Rfl: 5   dicyclomine  (BENTYL ) 20 MG tablet, Take 1 tablet (20 mg total) by mouth 3 (three) times daily as needed for spasms., Disp: 270 tablet, Rfl: 1   fluticasone  (FLONASE ) 50 MCG/ACT nasal spray, Place 2 sprays into both nostrils daily., Disp: 16 g, Rfl: 0   gabapentin  (NEURONTIN ) 300 MG capsule, Take 1 capsule by mouth at bedtime, Disp: 90 capsule, Rfl: 0   HYDROcodone -acetaminophen  (NORCO/VICODIN) 5-325 MG tablet, Take 2 tablets by mouth every 8 (eight) hours as needed., Disp: 14 tablet, Rfl: 0   Insulin  Pen Needle (PEN NEEDLES 3/16) 31G X 5 MM MISC, 1 each by Does not apply route daily., Disp: 100 each, Rfl: 0   lisinopril  (ZESTRIL ) 20 MG tablet,  Take 1 tablet (20 mg total) by mouth daily., Disp: 90 tablet, Rfl: 1   lovastatin  (MEVACOR ) 40 MG tablet, Take 1 tablet by mouth once daily, Disp: 90 tablet, Rfl: 1   metFORMIN  (GLUCOPHAGE -XR) 750 MG 24 hr tablet, Take 1 tablet (750 mg total) by mouth daily with breakfast., Disp: 90 tablet, Rfl: 1   montelukast  (SINGULAIR ) 10 MG tablet, Take 1 tablet (10 mg total) by mouth at bedtime., Disp: 30 tablet, Rfl: 5   nystatin  cream (MYCOSTATIN ), Apply 1 Application topically 2 (two) times daily., Disp: 30 g, Rfl: 2   ondansetron  (ZOFRAN -ODT) 4 MG disintegrating tablet, Take 1 tablet (4 mg total) by mouth every 6 (six) hours as needed for nausea or vomiting., Disp: 20 tablet, Rfl: 0   OZEMPIC , 2 MG/DOSE, 8 MG/3ML SOPN, Inject 2 mg into the skin once a week., Disp: 9 mL, Rfl: 0   pantoprazole  (PROTONIX ) 40 MG tablet, Take 1 tablet (40 mg total) by mouth daily., Disp: 90 tablet, Rfl: 0   pioglitazone  (ACTOS ) 15 MG tablet, Take 1 tablet (15 mg total) by mouth daily., Disp: 90 tablet, Rfl: 1   sertraline  (ZOLOFT ) 100 MG tablet, TAKE 1 TABLET BY MOUTH ONCE DAILY (ALONG WITH 50 MG TABLET), Disp: 90 tablet, Rfl: 1   sertraline  (ZOLOFT ) 50 MG tablet, TAKE 1 TABLET BY MOUTH ONCE DAILY (ALONG WITH 100 MG TABLET), Disp: 90 tablet, Rfl: 1   Medications ordered in this encounter:  Meds ordered this encounter  Medications   doxycycline  (VIBRA -TABS) 100 MG tablet    Sig: Take 1 tablet (100 mg total) by mouth 2 (two) times daily.    Dispense:  14 tablet    Refill:  0    Supervising Provider:   LAMPTEY, PHILIP O [[id number]]   fluconazole  (DIFLUCAN ) 150 MG tablet    Sig: Take 1 tablet (150 mg total) by mouth every 3 (three) days as needed.    Dispense:  2 tablet    Refill:  0    Supervising Provider:   LAMPTEY, PHILIP O [[id number]]     *If you need refills on other medications prior to your next appointment, please contact your pharmacy*  Follow-Up: Call back or seek an in-person evaluation if the symptoms worsen  or if the condition fails to improve as anticipated.  [facility] [phone]  Other Instructions Sinus Infection, Adult A sinus infection, also called sinusitis, is inflammation of your sinuses. Sinuses are hollow spaces in the bones around your face. Your sinuses are located: Around your eyes. In the middle of your forehead. Behind your nose. In your cheekbones. Mucus normally drains out of your sinuses. When your nasal tissues become inflamed or swollen, mucus can become trapped or blocked. This allows bacteria, viruses, and fungi to grow, which leads to infection. Most infections of the sinuses are caused by a virus. A sinus infection can develop quickly. It can last for up to 4 weeks (acute) or for more than 12 weeks (chronic). A sinus infection often develops after a cold. What are the causes? This condition is caused by anything that creates swelling in the sinuses or stops mucus from draining. This includes: Allergies. Asthma. Infection from bacteria or viruses. Deformities or blockages in your nose or sinuses. Abnormal growths in the nose (nasal polyps). Pollutants, such as chemicals or irritants in the air. Infection from fungi. This is rare. What increases the risk? You are more likely to develop this condition if you: Have a weak body defense system (immune system). Do a lot of swimming or diving. Overuse nasal sprays. Smoke. What are the signs or symptoms? The main symptoms of this condition are pain and a feeling of pressure around the affected sinuses. Other symptoms include: Stuffy nose or congestion that makes it difficult to breathe through your nose. Thick yellow or greenish drainage from your nose. Tenderness, swelling, and warmth over the affected sinuses. A cough that may get worse at night. Decreased sense of smell and taste. Extra mucus that collects in the throat or the back of the nose (postnasal drip) causing a sore throat or bad  breath. Tiredness (fatigue). Fever. How is this diagnosed? This condition is diagnosed based on: Your symptoms. Your medical history. A physical exam. Tests to find out if your condition is acute or chronic. This may include: Checking your nose for nasal polyps. Viewing your sinuses using a device that has a light (endoscope). Testing for allergies or bacteria. Imaging tests, such as an MRI or CT scan. In rare cases, a bone biopsy may be done to rule out more serious types of fungal sinus disease. How is this treated? Treatment for a sinus infection depends on the cause and whether your condition is chronic or acute. If caused by a virus, your symptoms should go away on their own within 10 days. You may be given medicines to relieve symptoms. They include: Medicines that shrink swollen nasal passages (decongestants). A spray that eases inflammation of the nostrils (topical intranasal  corticosteroids). Rinses that help get rid of thick mucus in your nose (nasal saline washes). Medicines that treat allergies (antihistamines). Over-the-counter pain relievers. If caused by bacteria, your health care provider may recommend waiting to see if your symptoms improve. Most bacterial infections will get better without antibiotic medicine. You may be given antibiotics if you have: A severe infection. A weak immune system. If caused by narrow nasal passages or nasal polyps, surgery may be needed. Follow these instructions at home: Medicines Take, use, or apply over-the-counter and prescription medicines only as told by your health care provider. These may include nasal sprays. If you were prescribed an antibiotic medicine, take it as told by your health care provider. Do not stop taking the antibiotic even if you start to feel better. Hydrate and humidify  Drink enough fluid to keep your urine pale yellow. Staying hydrated will help to thin your mucus. Use a cool mist humidifier to keep the  humidity level in your home above 50%. Inhale steam for 10-15 minutes, 3-4 times a day, or as told by your health care provider. You can do this in the bathroom while a hot shower is running. Limit your exposure to cool or dry air. Rest Rest as much as possible. Sleep with your head raised (elevated). Make sure you get enough sleep each night. General instructions  Apply a warm, moist washcloth to your face 3-4 times a day or as told by your health care provider. This will help with discomfort. Use nasal saline washes as often as told by your health care provider. Wash your hands often with soap and water to reduce your exposure to germs. If soap and water are not available, use hand sanitizer. Do not smoke. Avoid being around people who are smoking (secondhand smoke). Keep all follow-up visits. This is important. Contact a health care provider if: You have a fever. Your symptoms get worse. Your symptoms do not improve within 10 days. Get help right away if: You have a severe headache. You have persistent vomiting. You have severe pain or swelling around your face or eyes. You have vision problems. You develop confusion. Your neck is stiff. You have trouble breathing. These symptoms may be an emergency. Get help right away. Call 911. Do not wait to see if the symptoms will go away. Do not drive yourself to the hospital. Summary A sinus infection is soreness and inflammation of your sinuses. Sinuses are hollow spaces in the bones around your face. This condition is caused by nasal tissues that become inflamed or swollen. The swelling traps or blocks the flow of mucus. This allows bacteria, viruses, and fungi to grow, which leads to infection. If you were prescribed an antibiotic medicine, take it as told by your health care provider. Do not stop taking the antibiotic even if you start to feel better. Keep all follow-up visits. This is important. This information is not intended to  replace advice given to you by your health care provider. Make sure you discuss any questions you have with your health care provider. Document Revised: 02/20/2021 Document Reviewed: 02/20/2021 Elsevier Patient Education  2024 [employer]vier [employer]   If you have been instructed to have an in-person evaluation today at a local Urgent Care facility, please use the link below. It will take you to a list of all of our available [facility], including address, phone number and hours of operation. Please do not delay care.  [facility]  If you or a  family member do not have a primary care provider, use the link below to schedule a visit and establish care. When you choose a [facility] primary care physician or advanced practice provider, you gain a long-term partner in health. Find a Primary Care Provider  Learn more about [facility]'s in-office and virtual care options: [facility] - Get Care Now

## 2024-01-09 NOTE — Progress Notes (Signed)
 Virtual Visit Consent   Betty Walters, you are scheduled for a virtual visit with a [facility] provider today. Just as with appointments in the office, your consent must be obtained to participate. Your consent will be active for this visit and any virtual visit you may have with one of our providers in the next 365 days. If you have a MyChart account, a copy of this consent can be sent to you electronically.  As this is a virtual visit, video technology does not allow for your provider to perform a traditional examination. This may limit your provider's ability to fully assess your condition. If your provider identifies any concerns that need to be evaluated in person or the need to arrange testing (such as labs, EKG, etc.), we will make arrangements to do so. Although advances in technology are sophisticated, we cannot ensure that it will always work on either your end or our end. If the connection with a video visit is poor, the visit may have to be switched to a telephone visit. With either a video or telephone visit, we are not always able to ensure that we have a secure connection.  By engaging in this virtual visit, you consent to the provision of healthcare and authorize for your insurance to be billed (if applicable) for the services provided during this visit. Depending on your insurance coverage, you may receive a charge related to this service.  I need to obtain your verbal consent now. Are you willing to proceed with your visit today? [name] has provided verbal consent on 01/09/2024 for a virtual visit (video or telephone). Delon CHRISTELLA Dickinson, PA-C  Date: 01/09/2024 4:37 PM   Virtual Visit via Video Note   IDelon CHRISTELLA Dickinson, connected with  Betty Walters  ([id number], Jul 30, 1960) on 01/09/24 at  4:30 PM EDT by a video-enabled telemedicine application and verified that I am speaking with the correct person using two identifiers.  Location: Patient: Virtual Visit  Location Patient: Home Provider: Virtual Visit Location Provider: Home Office   I discussed the limitations of evaluation and management by telemedicine and the availability of in person appointments. The patient expressed understanding and agreed to proceed.    History of Present Illness: Betty Walters is a 63 y.o. who identifies as a female who was assigned female at birth, and is being seen today for sinus congestion.  HPI: Sinusitis This is a new problem. The current episode started 1 to 4 weeks ago. The problem has been gradually worsening since onset. There has been no fever. The pain is moderate. Associated symptoms include congestion, coughing (from drainage), ear pain (right >left), headaches, sinus pressure and a sore throat (scratchy from drainage). Pertinent negatives include no chills, diaphoresis, hoarse voice or shortness of breath. Treatments tried: OTC sinus anad cold. The treatment provided no relief.     Problems:  Patient Active Problem List   Diagnosis Date Noted   Long-term current use of injectable noninsulin antidiabetic medication 11/06/2023   Long-term current use of proton pump inhibitor therapy 11/06/2023   Long term current use of oral hypoglycemic drug 11/06/2023   Diarrhea 11/06/2023   Polypharmacy 11/06/2023   GERD without esophagitis 12/10/2021   History of colonic polyps    Chronic pain of toes of both feet 09/20/2016   Allergic rhinitis due to pollen 06/21/2015   HLD (hyperlipidemia) 09/28/2014   Essential hypertension 09/28/2014   Class 1 obesity with serious comorbidity in adult 09/28/2014   Type  2 diabetes mellitus with peripheral neuropathy (HCC) 09/28/2014   Major depression, recurrent 09/28/2014    Allergies:  Allergies  Allergen Reactions   Amoxicillin -Pot Clavulanate Nausea And Vomiting    (OK with Amoxicillin  alone)   Medications:  Current Outpatient Medications:    doxycycline  (VIBRA -TABS) 100 MG tablet, Take 1 tablet (100 mg total)  by mouth 2 (two) times daily., Disp: 14 tablet, Rfl: 0   fluconazole  (DIFLUCAN ) 150 MG tablet, Take 1 tablet (150 mg total) by mouth every 3 (three) days as needed., Disp: 2 tablet, Rfl: 0   celecoxib  (CELEBREX ) 100 MG capsule, Take 1 capsule (100 mg total) by mouth 2 (two) times daily. (Patient taking differently: Take 100 mg by mouth as needed.), Disp: 120 capsule, Rfl: 1   cyclobenzaprine  (FLEXERIL ) 5 MG tablet, Take 1 tablet (5 mg total) by mouth at bedtime as needed., Disp: 30 tablet, Rfl: 5   dicyclomine  (BENTYL ) 20 MG tablet, Take 1 tablet (20 mg total) by mouth 3 (three) times daily as needed for spasms., Disp: 270 tablet, Rfl: 1   fluticasone  (FLONASE ) 50 MCG/ACT nasal spray, Place 2 sprays into both nostrils daily., Disp: 16 g, Rfl: 0   gabapentin  (NEURONTIN ) 300 MG capsule, Take 1 capsule by mouth at bedtime, Disp: 90 capsule, Rfl: 0   HYDROcodone -acetaminophen  (NORCO/VICODIN) 5-325 MG tablet, Take 2 tablets by mouth every 8 (eight) hours as needed., Disp: 14 tablet, Rfl: 0   Insulin  Pen Needle (PEN NEEDLES 3/16) 31G X 5 MM MISC, 1 each by Does not apply route daily., Disp: 100 each, Rfl: 0   lisinopril  (ZESTRIL ) 20 MG tablet, Take 1 tablet (20 mg total) by mouth daily., Disp: 90 tablet, Rfl: 1   lovastatin  (MEVACOR ) 40 MG tablet, Take 1 tablet by mouth once daily, Disp: 90 tablet, Rfl: 1   metFORMIN  (GLUCOPHAGE -XR) 750 MG 24 hr tablet, Take 1 tablet (750 mg total) by mouth daily with breakfast., Disp: 90 tablet, Rfl: 1   montelukast  (SINGULAIR ) 10 MG tablet, Take 1 tablet (10 mg total) by mouth at bedtime., Disp: 30 tablet, Rfl: 5   nystatin  cream (MYCOSTATIN ), Apply 1 Application topically 2 (two) times daily., Disp: 30 g, Rfl: 2   ondansetron  (ZOFRAN -ODT) 4 MG disintegrating tablet, Take 1 tablet (4 mg total) by mouth every 6 (six) hours as needed for nausea or vomiting., Disp: 20 tablet, Rfl: 0   OZEMPIC , 2 MG/DOSE, 8 MG/3ML SOPN, Inject 2 mg into the skin once a week., Disp: 9 mL,  Rfl: 0   pantoprazole  (PROTONIX ) 40 MG tablet, Take 1 tablet (40 mg total) by mouth daily., Disp: 90 tablet, Rfl: 0   pioglitazone  (ACTOS ) 15 MG tablet, Take 1 tablet (15 mg total) by mouth daily., Disp: 90 tablet, Rfl: 1   sertraline  (ZOLOFT ) 100 MG tablet, TAKE 1 TABLET BY MOUTH ONCE DAILY (ALONG WITH 50 MG TABLET), Disp: 90 tablet, Rfl: 1   sertraline  (ZOLOFT ) 50 MG tablet, TAKE 1 TABLET BY MOUTH ONCE DAILY (ALONG WITH 100 MG TABLET), Disp: 90 tablet, Rfl: 1  Observations/Objective: Patient is well-developed, well-nourished in no acute distress.  Resting comfortably at home.  Head is normocephalic, atraumatic.  No labored breathing. Speech is clear and coherent with logical content.  Patient is alert and oriented at baseline.    Assessment and Plan: 1. Acute bacterial sinusitis (Primary) - doxycycline  (VIBRA -TABS) 100 MG tablet; Take 1 tablet (100 mg total) by mouth 2 (two) times daily.  Dispense: 14 tablet; Refill: 0  2. Antibiotic-induced yeast infection -  fluconazole  (DIFLUCAN ) 150 MG tablet; Take 1 tablet (150 mg total) by mouth every 3 (three) days as needed.  Dispense: 2 tablet; Refill: 0  - Worsening symptoms that have not responded to OTC medications.  - Will give Doxycycline  - Continue allergy medications.  - Steam and humidifier can help - Stay well hydrated and get plenty of rest.  - Diflucan  given as prophylaxis as patient tends to get vaginal yeast infections with antibiotic use - Seek in person evaluation if no symptom improvement or if symptoms worsen   Follow Up Instructions: I discussed the assessment and treatment plan with the patient. The patient was provided an opportunity to ask questions and all were answered. The patient agreed with the plan and demonstrated an understanding of the instructions.  A copy of instructions were sent to the patient via MyChart unless otherwise noted below.    The patient was advised to call back or seek an in-person evaluation  if the symptoms worsen or if the condition fails to improve as anticipated.    Delon CHRISTELLA Dickinson, PA-C

## 2024-01-26 ENCOUNTER — Telehealth: Payer: Self-pay

## 2024-01-26 NOTE — Telephone Encounter (Signed)
 Please complete PA for Ozempic  2 MG.  KEY:[id number]  KP

## 2024-01-27 ENCOUNTER — Other Ambulatory Visit (HOSPITAL_COMMUNITY): Payer: Self-pay

## 2024-01-27 ENCOUNTER — Telehealth: Payer: Self-pay | Admitting: Pharmacy Technician

## 2024-01-27 NOTE — Telephone Encounter (Signed)
 Noted  KP

## 2024-01-27 NOTE — Telephone Encounter (Signed)
 PA request has been Received. New Encounter has been or will be created for follow up. For additional info see Pharmacy Prior Auth telephone encounter from 01/27/24.

## 2024-01-27 NOTE — Telephone Encounter (Signed)
 Pharmacy Patient Advocate Encounter   Received notification from Pt Calls Messages that prior authorization for Ozempic  (2 MG/DOSE) 8MG /3ML pen-injectors is required/requested.   Insurance verification completed.   The patient is insured through [facility].   Per test claim: Refill too soon. PA is not needed at this time. Medication was filled 01/24/24. Next eligible fill date is once 75% of the medication is finished.

## 2024-01-28 NOTE — Telephone Encounter (Signed)
 Per test claim: Refill too soon. PA is not needed at this time. Medication was filled 01/24/24. Next eligible fill date is once 75% of the medication is finished.

## 2024-02-04 ENCOUNTER — Ambulatory Visit: Admitting: Physician Assistant

## 2024-02-09 LAB — OPHTHALMOLOGY REPORT-SCANNED

## 2024-02-12 ENCOUNTER — Emergency Department

## 2024-02-12 ENCOUNTER — Other Ambulatory Visit: Payer: Self-pay

## 2024-02-12 ENCOUNTER — Telehealth: Admitting: Family Medicine

## 2024-02-12 ENCOUNTER — Emergency Department
Admission: EM | Admit: 2024-02-12 | Discharge: 2024-02-13 | Disposition: A | Discharge status: Home / Self Care | Attending: Emergency Medicine | Admitting: Emergency Medicine

## 2024-02-12 DIAGNOSIS — R55 Syncope and collapse: Secondary | ICD-10-CM | POA: Insufficient documentation

## 2024-02-12 DIAGNOSIS — W228XXA Striking against or struck by other objects, initial encounter: Secondary | ICD-10-CM | POA: Insufficient documentation

## 2024-02-12 DIAGNOSIS — J020 Streptococcal pharyngitis: Secondary | ICD-10-CM

## 2024-02-12 DIAGNOSIS — R197 Diarrhea, unspecified: Secondary | ICD-10-CM | POA: Insufficient documentation

## 2024-02-12 DIAGNOSIS — S62656A Nondisplaced fracture of medial phalanx of right little finger, initial encounter for closed fracture: Secondary | ICD-10-CM | POA: Insufficient documentation

## 2024-02-12 DIAGNOSIS — I10 Essential (primary) hypertension: Secondary | ICD-10-CM | POA: Diagnosis not present

## 2024-02-12 DIAGNOSIS — E860 Dehydration: Secondary | ICD-10-CM | POA: Insufficient documentation

## 2024-02-12 DIAGNOSIS — S6991XA Unspecified injury of right wrist, hand and finger(s), initial encounter: Secondary | ICD-10-CM | POA: Diagnosis present

## 2024-02-12 LAB — TROPONIN T, HIGH SENSITIVITY: Troponin T High Sensitivity: <15 ng/L (ref 0–19)

## 2024-02-12 LAB — CBC
HCT: 41 % (ref 36.0–46.0)
Hemoglobin: 13.3 g/dL (ref 12.0–15.0)
MCH: 29.1 pg (ref 26.0–34.0)
MCHC: 32.4 g/dL (ref 30.0–36.0)
MCV: 89.7 fL (ref 80.0–100.0)
Platelets: 257 K/uL (ref 150–400)
RBC: 4.57 MIL/uL (ref 3.87–5.11)
RDW: 13.3 % (ref 11.5–15.5)
WBC: 17.3 K/uL — ABNORMAL HIGH (ref 4.0–10.5)
nRBC: 0 % (ref 0.0–0.2)

## 2024-02-12 LAB — URINALYSIS, ROUTINE W REFLEX MICROSCOPIC
Bilirubin Urine: NEGATIVE
Glucose, UA: NEGATIVE mg/dL
Hgb urine dipstick: NEGATIVE
Ketones, ur: NEGATIVE mg/dL
Leukocytes,Ua: NEGATIVE
Nitrite: NEGATIVE
Protein, ur: 100 mg/dL — AB
Specific Gravity, Urine: 1.02 (ref 1.005–1.030)
pH: 5 (ref 5.0–8.0)

## 2024-02-12 LAB — COMPREHENSIVE METABOLIC PANEL WITH GFR
ALT: 18 U/L (ref 0–44)
AST: 20 U/L (ref 15–41)
Albumin: 4.9 g/dL (ref 3.5–5.0)
Alkaline Phosphatase: 84 U/L (ref 38–126)
Anion gap: 15 (ref 5–15)
BUN: 29 mg/dL — ABNORMAL HIGH (ref 8–23)
CO2: 20 mmol/L — ABNORMAL LOW (ref 22–32)
Calcium: 11 mg/dL — ABNORMAL HIGH (ref 8.9–10.3)
Chloride: 104 mmol/L (ref 98–111)
Creatinine, Ser: 1.24 mg/dL — ABNORMAL HIGH (ref 0.44–1.00)
GFR, Estimated: 49 mL/min — ABNORMAL LOW (ref >60)
Glucose, Bld: 171 mg/dL — ABNORMAL HIGH (ref 70–99)
Potassium: 4.5 mmol/L (ref 3.5–5.1)
Sodium: 139 mmol/L (ref 135–145)
Total Bilirubin: 0.3 mg/dL (ref 0.0–1.2)
Total Protein: 8.1 g/dL (ref 6.5–8.1)

## 2024-02-12 LAB — CBG MONITORING, ED: Glucose-Capillary: 130 mg/dL — ABNORMAL HIGH (ref 70–99)

## 2024-02-12 LAB — LIPASE, BLOOD: Lipase: 48 U/L (ref 11–51)

## 2024-02-12 MED ORDER — ONDANSETRON HCL 4 MG PO TABS: 4.0000 mg | ORAL_TABLET | Freq: Three times a day (TID) | ORAL | 0 refills | Status: AC | PRN

## 2024-02-12 MED ORDER — ONDANSETRON HCL 4 MG/2ML IJ SOLN
4.0000 mg | Freq: Once | INTRAMUSCULAR | Status: AC
  Administered 2024-02-13: 4 mg via INTRAVENOUS
  Filled 2024-02-12: qty 2

## 2024-02-12 MED ORDER — SODIUM CHLORIDE 0.9 % IV BOLUS
2000.0000 mL | Freq: Once | INTRAVENOUS | Status: AC
  Administered 2024-02-13: 1000 mL via INTRAVENOUS

## 2024-02-12 MED ORDER — AMOXICILLIN 875 MG PO TABS: 875.0000 mg | ORAL_TABLET | Freq: Two times a day (BID) | ORAL | 0 refills | Status: AC

## 2024-02-12 NOTE — Patient Instructions (Signed)

## 2024-02-12 NOTE — ED Provider Notes (Signed)
 [facility] Provider Note    Event Date/Time   First MD Initiated Contact with Patient 02/12/24 2310     (approximate)   History   Loss of Consciousness   HPI  Betty Walters is a 63 y.o. female   Past medical history of hypertension who presents to the emergency department with syncopal episode.  Has had several grandchildren sick with GI illness and contracted the same this morning with nausea, and profuse diarrhea, watery nonbloody.  No significant abdominal pain.  Did not drink much water.  Then she got up to walk her dog and fainted.  Downtime no more than 20 minutes.  No history of seizure, no postictal period.  When she awoke she felt she had stool again and went to the bathroom for another watery diarrheal episode.  Prior to her syncopal episode no palpitations chest pain or shortness of breath.  No urinary symptoms.  No other acute medical complaints.  Apparently her grandchildren who are sick with GI illness all tested positive for strep.  She called her primary doctor earlier today and was sent a prescription for amoxicillin , has yet to start  Independent Historian contributed to assessment above: Husband corroborates information past medical history as above    Physical Exam   Triage Vital Signs: ED Triage Vitals  Encounter Vitals Group     BP 02/12/24 2013 (!) 111/53     Girls Systolic BP Percentile --      Girls Diastolic BP Percentile --      Boys Systolic BP Percentile --      Boys Diastolic BP Percentile --      Pulse Rate 02/12/24 2013 69     Resp 02/12/24 2013 18     Temp 02/12/24 2013 97.7 F (36.5 C)     Temp src --      SpO2 02/12/24 2013 100 %     Weight 02/12/24 2014 208 lb (94.3 kg)     Height 02/12/24 2014 5' 8 (1.727 m)     Head Circumference --      Peak Flow --      Pain Score 02/12/24 2014 7     Pain Loc --      Pain Education --      Exclude from Growth Chart --     Most recent vital signs: Vitals:    02/12/24 2013 02/13/24 0021  BP: (!) 111/53 135/62  Pulse: 69 80  Resp: 18 18  Temp: 97.7 F (36.5 C)   SpO2: 100% 98%    General: Awake, no distress.  CV:  Good peripheral perfusion.  Resp:  Normal effort.  Abd:  No distention.  Other:  Awake alert comfortable pleasant woman in no acute distress with normal vital signs.  Soft benign abdominal exam nontender nonperitoneal.  Skin turgor looks poor, dry cracked lips looks mildly dehydrated.  Head to toe examination shows no acute traumatic injuries except for a bruised right pinky finger.   ED Results / Procedures / Treatments   Labs (all labs ordered are listed, but only abnormal results are displayed) Labs Reviewed  COMPREHENSIVE METABOLIC PANEL WITH GFR - Abnormal; Notable for the following components:      Result Value   CO2 20 (*)    Glucose, Bld 171 (*)    BUN 29 (*)    Creatinine, Ser 1.24 (*)    Calcium 11.0 (*)    GFR, Estimated 49 (*)    All other components  within normal limits  CBC - Abnormal; Notable for the following components:   WBC 17.3 (*)    All other components within normal limits  URINALYSIS, ROUTINE W REFLEX MICROSCOPIC - Abnormal; Notable for the following components:   Color, Urine AMBER (*)    APPearance CLOUDY (*)    Protein, ur 100 (*)    Bacteria, UA MANY (*)    All other components within normal limits  CBG MONITORING, ED - Abnormal; Notable for the following components:   Glucose-Capillary 130 (*)    All other components within normal limits  LIPASE, BLOOD  MAGNESIUM   TROPONIN T, HIGH SENSITIVITY  TROPONIN T, HIGH SENSITIVITY  TROPONIN T, HIGH SENSITIVITY     I ordered and reviewed the above labs they are notable for leukocytosis, initial troponin negative.  EKG  ED ECG REPORT I, Ginnie Shams, the attending physician, personally viewed and interpreted this ECG.   Date: 02/12/2024  EKG Time: 2019  Rate: 72  Rhythm: sinus  Axis: nl  Intervals:nl  ST&T Change: no  stemi    RADIOLOGY I independently reviewed and interpreted hand x-ray and see a broken pinky finger I also reviewed radiologist's formal read.   PROCEDURES:  Critical Care performed: No  Procedures   MEDICATIONS ORDERED IN ED: Medications  sodium chloride  0.9 % bolus 2,000 mL (1,000 mLs Intravenous New Bag/Given 02/13/24 0014)  ondansetron  (ZOFRAN ) injection 4 mg (4 mg Intravenous Given 02/13/24 0015)     IMPRESSION / MDM / ASSESSMENT AND PLAN / ED COURSE  I reviewed the triage vital signs and the nursing notes.                                Patient's presentation is most consistent with acute presentation with potential threat to life or bodily function.  Differential diagnosis includes, but is not limited to, diarrheal illness, intra-abdominal infection, urinary tract infection, syncope, cardiogenic syncope, dehydration or orthostatic hypotension, considered but less likely cardiopulmonary emergencies.   The patient is on the cardiac monitor to evaluate for evidence of arrhythmia and/or significant heart rate changes.  MDM:    I think her syncopal episode was in the setting of dehydration as evidenced by her diarrheal illness and clinical exam as above.  Considered cardiopulmonary emergencies but less likely, especially with normal EKG, normal electrolytes, and normal troponin.  Doubt seizure.  Given 2 L of fluids.  Benign abdominal exam rules against surgical abdominal pathologies, known sick contacts with likely viral source.  Considered strep as her grandchildren all tested positive her primary doctor is already prescribed amoxicillin  for her, can take as directed.  Pinky finger was splinted  I considered hospitalization for admission or observation however given stability in the emergency department and relatively benign workup as above, I think safe for discharge home today and follow-up with PMD.  Prescription for Zofran  given.  Return precautions given.         FINAL CLINICAL IMPRESSION(S) / ED DIAGNOSES   Final diagnoses:  Syncope, unspecified syncope type  Nondisplaced fracture of middle phalanx of right little finger, initial encounter for closed fracture  Dehydration  Diarrhea, unspecified type     Rx / DC Orders   ED Discharge Orders          Ordered    ondansetron  (ZOFRAN -ODT) 4 MG disintegrating tablet  Every 8 hours PRN        02/13/24 0119  Note:  This document was prepared using Dragon voice recognition software and may include unintentional dictation errors.    Cyrena Mylar, MD 02/13/24 [phone]

## 2024-02-12 NOTE — ED Triage Notes (Signed)
 Patient wheeled to triage with complaints of syncopal episode tonight. Patient states she has not felt good all day with diarrhea and nausea. States she went to walk the dog and that's the last she remembers, hit her head on the ground, has scrapes on her hands, with deformed pinky on the right hand, right shoulder, and right hip pain

## 2024-02-12 NOTE — ED Provider Notes (Incomplete)
 [facility] Provider Note    Event Date/Time   First MD Initiated Contact with Patient 02/12/24 2310     (approximate)   History   Loss of Consciousness   HPI  Betty Walters is a 63 y.o. female   Past medical history of ***    Independent Historian contributed to assessment above: ***  External Medical Documents Reviewed: ***      Physical Exam   Triage Vital Signs: ED Triage Vitals  Encounter Vitals Group     BP 02/12/24 2013 (!) 111/53     Girls Systolic BP Percentile --      Girls Diastolic BP Percentile --      Boys Systolic BP Percentile --      Boys Diastolic BP Percentile --      Pulse Rate 02/12/24 2013 69     Resp 02/12/24 2013 18     Temp 02/12/24 2013 97.7 F (36.5 C)     Temp src --      SpO2 02/12/24 2013 100 %     Weight 02/12/24 2014 208 lb (94.3 kg)     Height 02/12/24 2014 5' 8 (1.727 m)     Head Circumference --      Peak Flow --      Pain Score 02/12/24 2014 7     Pain Loc --      Pain Education --      Exclude from Growth Chart --     Most recent vital signs: Vitals:   02/12/24 2013  BP: (!) 111/53  Pulse: 69  Resp: 18  Temp: 97.7 F (36.5 C)  SpO2: 100%    General: Awake, no distress. *** CV:  Good peripheral perfusion. *** Resp:  Normal effort. *** Abd:  No distention. *** Other:  ***   ED Results / Procedures / Treatments   Labs (all labs ordered are listed, but only abnormal results are displayed) Labs Reviewed  COMPREHENSIVE METABOLIC PANEL WITH GFR - Abnormal; Notable for the following components:      Result Value   CO2 20 (*)    Glucose, Bld 171 (*)    BUN 29 (*)    Creatinine, Ser 1.24 (*)    Calcium 11.0 (*)    GFR, Estimated 49 (*)    All other components within normal limits  CBC - Abnormal; Notable for the following components:   WBC 17.3 (*)    All other components within normal limits  URINALYSIS, ROUTINE W REFLEX MICROSCOPIC - Abnormal; Notable for the following  components:   Color, Urine AMBER (*)    APPearance CLOUDY (*)    Protein, ur 100 (*)    Bacteria, UA MANY (*)    All other components within normal limits  CBG MONITORING, ED - Abnormal; Notable for the following components:   Glucose-Capillary 130 (*)    All other components within normal limits  LIPASE, BLOOD  TROPONIN T, HIGH SENSITIVITY  TROPONIN T, HIGH SENSITIVITY     I ordered and reviewed the above labs they are notable for ***  EKG  ED ECG REPORT I, Ginnie Shams, the attending physician, personally viewed and interpreted this ECG.   Date: 02/12/2024  EKG Time: ***  Rate: ***  Rhythm: {ekg findings:315101}  Axis: ***  Intervals:{conduction defects:17367}  ST&T Change: ***    RADIOLOGY I independently reviewed and interpreted *** I also reviewed radiologist's formal read.   PROCEDURES:  Critical Care performed: {CriticalCareYesNo:19197::Yes, see critical  care procedure note(s),No}  Procedures   MEDICATIONS ORDERED IN ED: Medications - No data to display  External physician / consultants:  I spoke with *** regarding care plan for this patient.   IMPRESSION / MDM / ASSESSMENT AND PLAN / ED COURSE  I reviewed the triage vital signs and the nursing notes.                                Patient's presentation is most consistent with {EM COPA:27473}  Differential diagnosis includes, but is not limited to, ***   ***The patient is on the cardiac monitor to evaluate for evidence of arrhythmia and/or significant heart rate changes.  MDM:  ***  I considered hospitalization for admission or observation ***        FINAL CLINICAL IMPRESSION(S) / ED DIAGNOSES   Final diagnoses:  None     Rx / DC Orders   ED Discharge Orders     None        Note:  This document was prepared using Dragon voice recognition software and may include unintentional dictation errors.

## 2024-02-12 NOTE — Progress Notes (Signed)
 Virtual Visit Consent   Betty Walters, you are scheduled for a virtual visit with a [facility] provider today. Just as with appointments in the office, your consent must be obtained to participate. Your consent will be active for this visit and any virtual visit you may have with one of our providers in the next 365 days. If you have a MyChart account, a copy of this consent can be sent to you electronically.  As this is a virtual visit, video technology does not allow for your provider to perform a traditional examination. This may limit your provider's ability to fully assess your condition. If your provider identifies any concerns that need to be evaluated in person or the need to arrange testing (such as labs, EKG, etc.), we will make arrangements to do so. Although advances in technology are sophisticated, we cannot ensure that it will always work on either your end or our end. If the connection with a video visit is poor, the visit may have to be switched to a telephone visit. With either a video or telephone visit, we are not always able to ensure that we have a secure connection.  By engaging in this virtual visit, you consent to the provision of healthcare and authorize for your insurance to be billed (if applicable) for the services provided during this visit. Depending on your insurance coverage, you may receive a charge related to this service.  I need to obtain your verbal consent now. Are you willing to proceed with your visit today? Betty Walters has provided verbal consent on 02/12/2024 for a virtual visit (video or telephone). Loa Lamp, FNP  Date: 02/12/2024 12:01 PM   Virtual Visit via Video Note   I, Loa Lamp, connected with  Betty Walters  ([id number], [birth year]) on 02/12/24 at 11:45 AM EST by a video-enabled telemedicine application and verified that I am speaking with the correct person using two identifiers.  Location: Patient: Virtual Visit Location Patient:  Home Provider: Virtual Visit Location Provider: Home Office   I discussed the limitations of evaluation and management by telemedicine and the availability of in person appointments. The patient expressed understanding and agreed to proceed.    History of Present Illness: Betty Walters is a 63 y.o. who identifies as a female who was assigned female at birth, and is being seen today for sore throat, GI upset and exposure from grandkids to strep. No fever. Sx for 1 day. SABRA  HPI: HPI  Problems:  Patient Active Problem List   Diagnosis Date Noted   Long-term current use of injectable noninsulin antidiabetic medication 11/06/2023   Long-term current use of proton pump inhibitor therapy 11/06/2023   Long term current use of oral hypoglycemic drug 11/06/2023   Diarrhea 11/06/2023   Polypharmacy 11/06/2023   GERD without esophagitis 12/10/2021   History of colonic polyps    Chronic pain of toes of both feet 09/20/2016   Allergic rhinitis due to pollen 06/21/2015   HLD (hyperlipidemia) 09/28/2014   Essential hypertension 09/28/2014   Class 1 obesity with serious comorbidity in adult 09/28/2014   Type 2 diabetes mellitus with peripheral neuropathy (HCC) 09/28/2014   Major depression, recurrent 09/28/2014    Allergies:  Allergies  Allergen Reactions   Amoxicillin -Pot Clavulanate Nausea And Vomiting    (OK with Amoxicillin  alone)   Medications:  Current Outpatient Medications:    amoxicillin  (AMOXIL ) 875 MG tablet, Take 1 tablet (875 mg total) by mouth 2 (two) times daily for 10  days., Disp: 20 tablet, Rfl: 0   ondansetron  (ZOFRAN ) 4 MG tablet, Take 1 tablet (4 mg total) by mouth every 8 (eight) hours as needed for up to 10 days for nausea or vomiting., Disp: 20 tablet, Rfl: 0   celecoxib  (CELEBREX ) 100 MG capsule, Take 1 capsule (100 mg total) by mouth 2 (two) times daily. (Patient taking differently: Take 100 mg by mouth as needed.), Disp: 120 capsule, Rfl: 1   cyclobenzaprine  (FLEXERIL )  5 MG tablet, Take 1 tablet (5 mg total) by mouth at bedtime as needed., Disp: 30 tablet, Rfl: 5   dicyclomine  (BENTYL ) 20 MG tablet, Take 1 tablet (20 mg total) by mouth 3 (three) times daily as needed for spasms., Disp: 270 tablet, Rfl: 1   doxycycline  (VIBRA -TABS) 100 MG tablet, Take 1 tablet (100 mg total) by mouth 2 (two) times daily., Disp: 14 tablet, Rfl: 0   fluconazole  (DIFLUCAN ) 150 MG tablet, Take 1 tablet (150 mg total) by mouth every 3 (three) days as needed., Disp: 2 tablet, Rfl: 0   fluticasone  (FLONASE ) 50 MCG/ACT nasal spray, Place 2 sprays into both nostrils daily., Disp: 16 g, Rfl: 0   gabapentin  (NEURONTIN ) 300 MG capsule, Take 1 capsule by mouth at bedtime, Disp: 90 capsule, Rfl: 0   HYDROcodone -acetaminophen  (NORCO/VICODIN) 5-325 MG tablet, Take 2 tablets by mouth every 8 (eight) hours as needed., Disp: 14 tablet, Rfl: 0   Insulin  Pen Needle (PEN NEEDLES 3/16) 31G X 5 MM MISC, 1 each by Does not apply route daily., Disp: 100 each, Rfl: 0   lisinopril  (ZESTRIL ) 20 MG tablet, Take 1 tablet (20 mg total) by mouth daily., Disp: 90 tablet, Rfl: 1   lovastatin  (MEVACOR ) 40 MG tablet, Take 1 tablet by mouth once daily, Disp: 90 tablet, Rfl: 1   metFORMIN  (GLUCOPHAGE -XR) 750 MG 24 hr tablet, Take 1 tablet (750 mg total) by mouth daily with breakfast., Disp: 90 tablet, Rfl: 1   montelukast  (SINGULAIR ) 10 MG tablet, Take 1 tablet (10 mg total) by mouth at bedtime., Disp: 30 tablet, Rfl: 5   nystatin  cream (MYCOSTATIN ), Apply 1 Application topically 2 (two) times daily., Disp: 30 g, Rfl: 2   ondansetron  (ZOFRAN -ODT) 4 MG disintegrating tablet, Take 1 tablet (4 mg total) by mouth every 6 (six) hours as needed for nausea or vomiting., Disp: 20 tablet, Rfl: 0   OZEMPIC , 2 MG/DOSE, 8 MG/3ML SOPN, Inject 2 mg into the skin once a week., Disp: 9 mL, Rfl: 0   pantoprazole  (PROTONIX ) 40 MG tablet, Take 1 tablet (40 mg total) by mouth daily., Disp: 90 tablet, Rfl: 0   pioglitazone  (ACTOS ) 15 MG  tablet, Take 1 tablet (15 mg total) by mouth daily., Disp: 90 tablet, Rfl: 1   sertraline  (ZOLOFT ) 100 MG tablet, TAKE 1 TABLET BY MOUTH ONCE DAILY (ALONG WITH 50 MG TABLET), Disp: 90 tablet, Rfl: 1   sertraline  (ZOLOFT ) 50 MG tablet, TAKE 1 TABLET BY MOUTH ONCE DAILY (ALONG WITH 100 MG TABLET), Disp: 90 tablet, Rfl: 1  Observations/Objective: Patient is well-developed, well-nourished in no acute distress.  Resting comfortably  at home.  Head is normocephalic, atraumatic.  No labored breathing.  Speech is clear and coherent with logical content.  Patient is alert and oriented at baseline.    Assessment and Plan: 1. Strep throat (Primary)  Increase fluids warm salt water gargles, ibuprofen as directed, UC as needed.   Follow Up Instructions: I discussed the assessment and treatment plan with the patient. The patient was provided an  opportunity to ask questions and all were answered. The patient agreed with the plan and demonstrated an understanding of the instructions.  A copy of instructions were sent to the patient via MyChart unless otherwise noted below.     The patient was advised to call back or seek an in-person evaluation if the symptoms worsen or if the condition fails to improve as anticipated.    Arwen Haseley, FNP

## 2024-02-13 LAB — TROPONIN T, HIGH SENSITIVITY: Troponin T High Sensitivity: <15 ng/L (ref 0–19)

## 2024-02-13 LAB — MAGNESIUM: Magnesium: 2.1 mg/dL (ref 1.7–2.4)

## 2024-02-13 MED ORDER — ONDANSETRON 4 MG PO TBDP
4.0000 mg | ORAL_TABLET | Freq: Three times a day (TID) | ORAL | 0 refills | Status: AC | PRN
Start: 2024-02-13 — End: ?

## 2024-02-13 NOTE — Discharge Instructions (Addendum)
 Take Zofran  as needed for nausea and vomiting. Drink plenty of fluids to stay well-hydrated.  Find Pedialyte or similar electrolyte rehydration formulas at your local pharmacy.  Fortunately your lab testing today in terms of blood test, heart test, and other imaging for injuries was negative for emergency conditions that would require hospitalization or surgery at this time.  For your broken pinky finger, keep your finger in the splint at all times you may remove to wash as needed.  Follow-up with your primary doctor or the bone and joint specialist Dr. Edie for an appointment.  Take acetaminophen  650 mg and ibuprofen 400 mg every 6 hours for pain.  Take with food. You can also apply ice to the pinky finger to help with pain and swelling.  Thank you for choosing us  for your health care today!  Please see your primary doctor this week for a follow up appointment.   If you have any new, worsening, or unexpected symptoms call your doctor right away or come back to the emergency department for reevaluation.  It was my pleasure to care for you today.   Ginnie EDISON Cyrena, MD

## 2024-02-13 NOTE — ED Notes (Signed)
 Mental status at baseline. No distress, assisted to wheelchair for dc. Finger splint applied to 5th finger of right hand. Understands dc instructions.

## 2024-02-16 ENCOUNTER — Telehealth: Payer: Self-pay

## 2024-02-16 NOTE — Transitions of Care (Post Inpatient/ED Visit) (Unsigned)
   02/16/2024  Name: Betty Walters MRN: [id number] DOB: [birth year]  Today's TOC FU Call Status: Today's TOC FU Call Status:: Unsuccessful Call (1st Attempt) Unsuccessful Call (1st Attempt) Date: 02/16/24  Attempted to reach the patient regarding the most recent Inpatient/ED visit.  Follow Up Plan: Additional outreach attempts will be made to reach the patient to complete the Transitions of Care (Post Inpatient/ED visit) call.   [employer], CMA

## 2024-02-17 NOTE — Transitions of Care (Post Inpatient/ED Visit) (Signed)
   02/17/2024  Name: Betty Walters MRN: [id number] DOB: Jul 28, 1960  Today's TOC FU Call Status: Today's TOC FU Call Status:: Successful TOC FU Call Completed Unsuccessful Call (1st Attempt) Date: 02/16/24 [facility] FU Call Complete Date: 02/17/24  Patient's Name and Date of Birth confirmed. Name, DOB  Transition Care Management Follow-up Telephone Call Date of Discharge: 02/13/24 Discharge Facility: [facility]) Type of Discharge: Emergency Department Reason for ED Visit: Other: (Syncope and collapse) Any questions or concerns?: No  Items Reviewed: Did you receive and understand the discharge instructions provided?: Yes Medications obtained,verified, and reconciled?: Yes (Medications Reviewed) Any new allergies since your discharge?: No Dietary orders reviewed?: NA Do you have support at home?: Yes  Medications Reviewed Today: Medications Reviewed Today   Medications were not reviewed in this encounter     Home Care and Equipment/Supplies: Were Home Health Services Ordered?: NA Any new equipment or medical supplies ordered?: NA  Functional Questionnaire: Do you need assistance with bathing/showering or dressing?: No Do you need assistance with meal preparation?: No Do you need assistance with eating?: No Do you have difficulty maintaining continence: No Do you need assistance with getting out of bed/getting out of a chair/moving?: No Do you have difficulty managing or taking your medications?: No  Follow up appointments reviewed: PCP Follow-up appointment confirmed?:  (pt stated she has a appt in dec) Specialist Hospital Follow-up appointment confirmed?: Yes Date of Specialist follow-up appointment?: 02/23/24 Follow-Up Specialty Provider:: Dr. Cleotilde Do you need transportation to your follow-up appointment?: No Do you understand care options if your condition(s) worsen?: Yes-patient verbalized understanding    SIGNATUREKieandra Keller Bounds, CMA

## 2024-03-01 ENCOUNTER — Other Ambulatory Visit: Payer: Self-pay

## 2024-03-01 DIAGNOSIS — F3341 Major depressive disorder, recurrent, in partial remission: Secondary | ICD-10-CM

## 2024-03-01 MED ORDER — SERTRALINE HCL 50 MG PO TABS: ORAL_TABLET | ORAL | 0 refills | Status: DC

## 2024-03-01 MED ORDER — SERTRALINE HCL 100 MG PO TABS: ORAL_TABLET | ORAL | 0 refills | Status: DC

## 2024-03-01 NOTE — Telephone Encounter (Signed)
Noted, already done

## 2024-03-02 ENCOUNTER — Other Ambulatory Visit: Payer: Self-pay

## 2024-03-02 DIAGNOSIS — E119 Type 2 diabetes mellitus without complications: Secondary | ICD-10-CM

## 2024-03-02 MED ORDER — PIOGLITAZONE HCL 15 MG PO TABS: 15.0000 mg | ORAL_TABLET | Freq: Every day | ORAL | 0 refills | Status: AC

## 2024-03-17 ENCOUNTER — Ambulatory Visit: Admitting: Physician Assistant

## 2024-03-17 ENCOUNTER — Encounter: Payer: Self-pay | Admitting: Physician Assistant

## 2024-03-17 VITALS — BP 110/72 | HR 80 | Temp 97.5°F | Ht 68.0 in | Wt 212.0 lb

## 2024-03-17 DIAGNOSIS — I10 Essential (primary) hypertension: Secondary | ICD-10-CM

## 2024-03-17 DIAGNOSIS — E782 Mixed hyperlipidemia: Secondary | ICD-10-CM

## 2024-03-17 DIAGNOSIS — Z7985 Long-term (current) use of injectable non-insulin antidiabetic drugs: Secondary | ICD-10-CM | POA: Diagnosis not present

## 2024-03-17 DIAGNOSIS — M79674 Pain in right toe(s): Secondary | ICD-10-CM

## 2024-03-17 DIAGNOSIS — F3341 Major depressive disorder, recurrent, in partial remission: Secondary | ICD-10-CM

## 2024-03-17 DIAGNOSIS — M79675 Pain in left toe(s): Secondary | ICD-10-CM

## 2024-03-17 DIAGNOSIS — Z7984 Long term (current) use of oral hypoglycemic drugs: Secondary | ICD-10-CM

## 2024-03-17 DIAGNOSIS — R197 Diarrhea, unspecified: Secondary | ICD-10-CM

## 2024-03-17 DIAGNOSIS — G8929 Other chronic pain: Secondary | ICD-10-CM | POA: Diagnosis not present

## 2024-03-17 DIAGNOSIS — E1142 Type 2 diabetes mellitus with diabetic polyneuropathy: Secondary | ICD-10-CM | POA: Diagnosis not present

## 2024-03-17 LAB — POCT GLYCOSYLATED HEMOGLOBIN (HGB A1C): Hemoglobin A1C: 6.2 % — AB (ref 4.0–5.6)

## 2024-03-17 MED ORDER — LISINOPRIL 20 MG PO TABS: 20.0000 mg | ORAL_TABLET | Freq: Every day | ORAL | 1 refills | Status: AC

## 2024-03-17 MED ORDER — GABAPENTIN 300 MG PO CAPS: 300.0000 mg | ORAL_CAPSULE | Freq: Every day | ORAL | 1 refills | Status: AC

## 2024-03-17 MED ORDER — LOVASTATIN 40 MG PO TABS: ORAL_TABLET | ORAL | 1 refills | Status: AC

## 2024-03-17 MED ORDER — SERTRALINE HCL 100 MG PO TABS: ORAL_TABLET | ORAL | 1 refills | Status: AC

## 2024-03-17 MED ORDER — SERTRALINE HCL 50 MG PO TABS: ORAL_TABLET | ORAL | 1 refills | Status: AC

## 2024-03-17 MED ORDER — OZEMPIC (2 MG/DOSE) 8 MG/3ML ~~LOC~~ SOPN: 2.0000 mg | PEN_INJECTOR | SUBCUTANEOUS | 0 refills | Status: AC

## 2024-03-17 NOTE — Assessment & Plan Note (Signed)
 Refill lisinopril

## 2024-03-17 NOTE — Assessment & Plan Note (Signed)
 Refill lovastatin

## 2024-03-17 NOTE — Assessment & Plan Note (Signed)
 Offered sertraline  dose decrease but she would like to keep it the same at 150 mg for now.

## 2024-03-17 NOTE — Assessment & Plan Note (Signed)
 Refill gabapentin  for night time use.

## 2024-03-17 NOTE — Assessment & Plan Note (Signed)
 Improved with metformin  XR

## 2024-03-17 NOTE — Progress Notes (Signed)
 Date:  03/17/2024   Name:  Betty Walters   DOB:  Mar 29, 1961   MRN:  [id number]   Chief Complaint: Medical Management of Chronic Issues  HPI  Aisia returns for routine f/u on DM2 due for A1c. Last time we changed metformin  IR to XR which seems to have helped with her bowels.   In Nov she became ill with pharyngitis and gastroenteritis, and on 02/12/24 was seen in ED for syncope likely due to dehydration. Unfortunately she suffered several injuries from the syncope, most notably a closed comminuted fracture of the right 5th finger for which she has been working with ortho to address. She will be having some pins removed tomorrow.   Lost a friend to pancreatic cancer in Oct, says she had some emotional eating as a result.  Medication list has been reviewed and updated.  Active Medications[1]   Review of Systems  Patient Active Problem List   Diagnosis Date Noted   Long-term current use of injectable noninsulin antidiabetic medication 11/06/2023   Long-term current use of proton pump inhibitor therapy 11/06/2023   Long term current use of oral hypoglycemic drug 11/06/2023   Diarrhea 11/06/2023   Polypharmacy 11/06/2023   GERD without esophagitis 12/10/2021   History of colonic polyps    Chronic pain of toes of both feet 09/20/2016   Allergic rhinitis due to pollen 06/21/2015   HLD (hyperlipidemia) 09/28/2014   Essential hypertension 09/28/2014   Class 1 obesity with serious comorbidity in adult 09/28/2014   Type 2 diabetes mellitus with peripheral neuropathy (HCC) 09/28/2014   Major depression, recurrent 09/28/2014    Allergies[2]  Immunization History  Administered Date(s) Administered   Influenza, Quadrivalent, Recombinant, Inj, Pf 02/16/2019   Influenza,inj,Quad PF,6+ Mos 01/11/2016, 11/28/2016, 12/22/2017, 04/25/2020, 01/16/2021, 12/25/2021   Influenza-Unspecified 01/10/2023   PFIZER(Purple Top)SARS-COV-2 Vaccination 05/30/2019, 06/21/2019   Pneumococcal  Polysaccharide-23 02/10/2012, 04/04/2017   Tdap 02/13/2014, 01/14/2018    Past Surgical History:  Procedure Laterality Date   CESAREAN SECTION     COLONOSCOPY WITH PROPOFOL  N/A 04/11/2017   Procedure: COLONOSCOPY WITH colon;  Surgeon: Jinny Carmine, MD;  Location: [facility];  Service: Endoscopy;  Laterality: N/A;   ESOPHAGOGASTRODUODENOSCOPY (EGD) WITH PROPOFOL  N/A 04/11/2017   Procedure: ESOPHAGOGASTRODUODENOSCOPY (EGD) WITH PROPOFOL ;  Surgeon: Jinny Carmine, MD;  Location: [facility];  Service: Endoscopy;  Laterality: N/A;  Diabetic - oral meds   GALLBLADDER SURGERY     HAND SURGERY Right 2025   KNEE SURGERY     MYRINGOTOMY WITH TUBE PLACEMENT Right 03/22/2020   Procedure: MYRINGOTOMY WITH TUBE PLACEMENT;  Surgeon: Milissa Hamming, MD;  Location: [facility];  Service: ENT;  Laterality: Right;  Diabetic   POLYPECTOMY  04/11/2017   Procedure: POLYPECTOMY INTESTINAL;  Surgeon: Jinny Carmine, MD;  Location: [facility];  Service: Endoscopy;;   WRIST SURGERY      Social History[3]  Family History  Problem Relation Age of Onset   Cancer Mother        breast   Diabetes Father         03/17/2024    3:30 PM 10/17/2023    2:10 PM 01/09/2023    2:38 PM 07/29/2022    3:51 PM  GAD 7 : Generalized Anxiety Score  Nervous, Anxious, on Edge 0 0 0 0  Control/stop worrying 0 0 1 0  Worry too much - different things 0 0 1 0  Trouble relaxing 0 0 0 0  Restless 0 0 1 0  Easily annoyed or irritable 0 1 0 0  Afraid - awful might happen 0 0 0 0  Total GAD 7 Score 0 1 3 0  Anxiety Difficulty Not difficult at all Not difficult at all Not difficult at all Not difficult at all       03/17/2024    3:30 PM 03/17/2024    3:29 PM 10/17/2023    2:10 PM  Depression screen PHQ 2/9  Decreased Interest 0 0 0  Down, Depressed, Hopeless 0 0 0  PHQ - 2 Score 0 0 0  Altered sleeping 0  1  Tired, decreased energy 0  1  Change in appetite 0  0  Feeling bad or failure  about yourself  0  0  Trouble concentrating 0  0  Moving slowly or fidgety/restless 0  1  Suicidal thoughts 0  0  PHQ-9 Score 0  3   Difficult doing work/chores Not difficult at all  Not difficult at all     Data saved with a previous flowsheet row definition    BP Readings from Last 3 Encounters:  03/17/24 110/72  02/13/24 (!) 113/59  11/06/23 134/84    Wt Readings from Last 3 Encounters:  03/17/24 212 lb (96.2 kg)  02/12/24 208 lb (94.3 kg)  11/06/23 205 lb (93 kg)    BP 110/72   Pulse 80   Temp (!) 97.5 F (36.4 C)   Ht 5' 8 (1.727 m)   Wt 212 lb (96.2 kg)   BMI 32.23 kg/m   Physical Exam Vitals and nursing note reviewed.  Constitutional:      Appearance: Normal appearance.  Cardiovascular:     Rate and Rhythm: Normal rate and regular rhythm.     Heart sounds: No murmur heard.    No friction rub. No gallop.  Pulmonary:     Effort: Pulmonary effort is normal.     Breath sounds: Normal breath sounds.  Abdominal:     General: There is no distension.  Musculoskeletal:        General: Normal range of motion.  Skin:    General: Skin is warm and dry.  Neurological:     Mental Status: She is alert and oriented to person, place, and time.     Gait: Gait is intact.  Psychiatric:        Mood and Affect: Mood and affect normal.     Recent Labs     Component Value Date/Time   NA 139 02/12/2024 2020   NA 140 07/29/2022 1632   K 4.5 02/12/2024 2020   CL 104 02/12/2024 2020   CO2 20 (L) 02/12/2024 2020   GLUCOSE 171 (H) 02/12/2024 2020   BUN 29 (H) 02/12/2024 2020   BUN 21 07/29/2022 1632   CREATININE 1.24 (H) 02/12/2024 2020   CALCIUM 11.0 (H) 02/12/2024 2020   PROT 8.1 02/12/2024 2020   PROT 7.0 01/09/2023 1515   ALBUMIN 4.9 02/12/2024 2020   ALBUMIN 4.5 01/09/2023 1515   AST 20 02/12/2024 2020   ALT 18 02/12/2024 2020   ALKPHOS 84 02/12/2024 2020   BILITOT 0.3 02/12/2024 2020   BILITOT <0.2 01/09/2023 1515   GFRNONAA 49 (L) 02/12/2024 2020   GFRAA  82 04/25/2020 1122    Lab Results  Component Value Date   WBC 17.3 (H) 02/12/2024   HGB 13.3 02/12/2024   HCT 41.0 02/12/2024   MCV 89.7 02/12/2024   PLT 257 02/12/2024   Lab Results  Component Value Date  HGBA1C 6.2 (A) 03/17/2024   HGBA1C 6.3 (A) 11/06/2023   HGBA1C 6.7 06/30/2023   Lab Results  Component Value Date   CHOL 194 07/31/2023   HDL 57 07/31/2023   LDLCALC 106 (H) 07/31/2023   TRIG 180 (H) 07/31/2023   CHOLHDL 4.6 (H) 08/05/2018   No results found for: TSH    Assessment and Plan:  Type 2 diabetes mellitus with peripheral neuropathy (HCC) Assessment & Plan: A1c 6.2% today. No changes to pharmacotherapy at this time. Repeat in 3 months.   Orders: -     POCT glycosylated hemoglobin (Hb A1C) -     Ozempic  (2 MG/DOSE); Inject 2 mg into the skin once a week.  Dispense: 9 mL; Refill: 0  Long term current use of oral hypoglycemic drug  Long-term current use of injectable noninsulin antidiabetic medication  Recurrent major depressive disorder, in partial remission Assessment & Plan: Offered sertraline  dose decrease but she would like to keep it the same at 150 mg for now.   Orders: -     Sertraline  HCl; TAKE 1 TABLET BY MOUTH ONCE DAILY (ALONG WITH 50 MG TABLET)  Dispense: 90 tablet; Refill: 1 -     Sertraline  HCl; TAKE 1 TABLET BY MOUTH ONCE DAILY (ALONG WITH 100 MG TABLET)  Dispense: 90 tablet; Refill: 1  Mixed hyperlipidemia Assessment & Plan: Refill lovastatin   Orders: -     Lovastatin ; Take 1 tablet by mouth once daily  Dispense: 90 tablet; Refill: 1  Essential hypertension Assessment & Plan: Refill lisinopril   Orders: -     Lisinopril ; Take 1 tablet (20 mg total) by mouth daily.  Dispense: 90 tablet; Refill: 1  Chronic pain of toes of both feet Assessment & Plan: Refill gabapentin  for night time use.   Orders: -     Gabapentin ; Take 1 capsule (300 mg total) by mouth at bedtime.  Dispense: 90 capsule; Refill: 1  Diarrhea, unspecified  type Assessment & Plan: Improved with metformin  XR      F/u 39m OV DM2   Rolan Hoyle, PA-C, DMSc, DipACLM, Nutritionist  [facility] [phone]      [1]  Current Meds  Medication Sig   metaxalone (SKELAXIN) 800 MG tablet May take 1/2-1 whole tablet up to 3 times daily as needed for pain or spasm. (Patient taking differently: as needed.)   metFORMIN  (GLUCOPHAGE -XR) 750 MG 24 hr tablet Take 1 tablet (750 mg total) by mouth daily with breakfast.   pantoprazole  (PROTONIX ) 40 MG tablet Take 1 tablet (40 mg total) by mouth daily.   pioglitazone  (ACTOS ) 15 MG tablet Take 1 tablet (15 mg total) by mouth daily.   [DISCONTINUED] dicyclomine  (BENTYL ) 20 MG tablet Take 1 tablet (20 mg total) by mouth 3 (three) times daily as needed for spasms.   [DISCONTINUED] fluticasone  (FLONASE ) 50 MCG/ACT nasal spray Place 2 sprays into both nostrils daily.   [DISCONTINUED] gabapentin  (NEURONTIN ) 300 MG capsule Take 1 capsule by mouth at bedtime   [DISCONTINUED] lisinopril  (ZESTRIL ) 20 MG tablet Take 1 tablet (20 mg total) by mouth daily.   [DISCONTINUED] lovastatin  (MEVACOR ) 40 MG tablet Take 1 tablet by mouth once daily   [DISCONTINUED] ondansetron  (ZOFRAN -ODT) 4 MG disintegrating tablet Take 1 tablet (4 mg total) by mouth every 6 (six) hours as needed for nausea or vomiting.   [DISCONTINUED] OZEMPIC , 2 MG/DOSE, 8 MG/3ML SOPN Inject 2 mg into the skin once a week.   [DISCONTINUED] sertraline  (  ZOLOFT ) 100 MG tablet TAKE 1 TABLET BY MOUTH ONCE DAILY (ALONG WITH 50 MG TABLET)   [DISCONTINUED] sertraline  (ZOLOFT ) 50 MG tablet TAKE 1 TABLET BY MOUTH ONCE DAILY (ALONG WITH 100 MG TABLET)  [2]  Allergies Allergen Reactions   Amoxicillin -Pot Clavulanate Nausea And Vomiting    (OK with Amoxicillin  alone)  [3]  Social History Tobacco Use   Smoking status: Never   Smokeless tobacco: Never  Vaping Use   Vaping status: Never  Used  Substance Use Topics   Alcohol use: Yes    Alcohol/week: 0.0 standard drinks of alcohol    Comment: rare - Holidays   Drug use: No

## 2024-03-17 NOTE — Assessment & Plan Note (Signed)
 A1c 6.2% today. No changes to pharmacotherapy at this time. Repeat in 3 months.

## 2024-04-09 ENCOUNTER — Other Ambulatory Visit: Payer: Self-pay | Admitting: Physician Assistant

## 2024-04-09 DIAGNOSIS — R197 Diarrhea, unspecified: Secondary | ICD-10-CM

## 2024-04-09 MED ORDER — DICYCLOMINE HCL 20 MG PO TABS: 20.0000 mg | ORAL_TABLET | Freq: Three times a day (TID) | ORAL | 1 refills | Status: AC | PRN

## 2024-04-09 NOTE — Telephone Encounter (Signed)
 Please review.  KP

## 2024-06-16 ENCOUNTER — Ambulatory Visit: Admitting: Physician Assistant
